# Patient Record
Sex: Female | Born: 1935
Health system: Southern US, Community
[De-identification: ages and names within clinical notes are randomized; demographics above are authoritative.]

## PROBLEM LIST (undated history)

## (undated) DIAGNOSIS — N289 Disorder of kidney and ureter, unspecified: Secondary | ICD-10-CM

## (undated) DIAGNOSIS — I10 Essential (primary) hypertension: Secondary | ICD-10-CM

## (undated) DIAGNOSIS — M109 Gout, unspecified: Secondary | ICD-10-CM

## (undated) DIAGNOSIS — R0989 Other specified symptoms and signs involving the circulatory and respiratory systems: Secondary | ICD-10-CM

## (undated) DIAGNOSIS — E119 Type 2 diabetes mellitus without complications: Secondary | ICD-10-CM

## (undated) HISTORY — DX: Gout, unspecified: M10.9

## (undated) HISTORY — DX: Type 2 diabetes mellitus without complications: E11.9

## (undated) HISTORY — DX: Other specified symptoms and signs involving the circulatory and respiratory systems: R09.89

## (undated) HISTORY — DX: Essential (primary) hypertension: I10

## (undated) HISTORY — DX: Disorder of kidney and ureter, unspecified: N28.9

## (undated) HISTORY — PX: CATARACT EXTRACTION: SUR2

## (undated) HISTORY — PX: APPENDECTOMY: SHX54

---

## 2011-01-03 ENCOUNTER — Encounter (HOSPITAL_BASED_OUTPATIENT_CLINIC_OR_DEPARTMENT_OTHER): Payer: Medicare Other | Attending: General Surgery

## 2011-01-03 DIAGNOSIS — E119 Type 2 diabetes mellitus without complications: Secondary | ICD-10-CM | POA: Insufficient documentation

## 2011-01-03 DIAGNOSIS — Z79899 Other long term (current) drug therapy: Secondary | ICD-10-CM | POA: Insufficient documentation

## 2011-01-03 DIAGNOSIS — L97809 Non-pressure chronic ulcer of other part of unspecified lower leg with unspecified severity: Secondary | ICD-10-CM | POA: Insufficient documentation

## 2011-01-17 ENCOUNTER — Ambulatory Visit (INDEPENDENT_AMBULATORY_CARE_PROVIDER_SITE_OTHER): Payer: Medicare Other | Admitting: Vascular Surgery

## 2011-01-17 DIAGNOSIS — L97909 Non-pressure chronic ulcer of unspecified part of unspecified lower leg with unspecified severity: Secondary | ICD-10-CM

## 2011-01-21 ENCOUNTER — Encounter (HOSPITAL_BASED_OUTPATIENT_CLINIC_OR_DEPARTMENT_OTHER): Payer: Medicare Other | Attending: General Surgery

## 2011-01-21 DIAGNOSIS — L97809 Non-pressure chronic ulcer of other part of unspecified lower leg with unspecified severity: Secondary | ICD-10-CM | POA: Insufficient documentation

## 2011-01-21 DIAGNOSIS — E119 Type 2 diabetes mellitus without complications: Secondary | ICD-10-CM | POA: Insufficient documentation

## 2011-01-21 DIAGNOSIS — Z79899 Other long term (current) drug therapy: Secondary | ICD-10-CM | POA: Insufficient documentation

## 2011-02-04 NOTE — H&P (Signed)
  Jill Allen, Jill Allen               ACCOUNT NO.:  [id number]  MEDICAL RECORD NO.:  [id number]  LOCATION:  FOOT                         FACILITY:  [facility]  PHYSICIAN:  Joanne Gavel, M.D.        DATE OF BIRTH:  18-May-1915  DATE OF ADMISSION:  01/03/2011 DATE OF DISCHARGE:                             HISTORY & PHYSICAL   CHIEF COMPLAINT:  Wound to left leg.  HISTORY OF PRESENT ILLNESS:  This is a 77 year old female with a history of stasis ulceration of the legs for several years.  These were very extensive but were treated with Unna boots and healed several years ago. She has developed 2 small wounds on the left leg.  There has been no trauma.  No systemic symptoms of fever.  She has recently come to [place] from [place].  PAST MEDICAL HISTORY:  She has type 2 diabetes that was treated with diet only.  PAST SURGICAL HISTORY:  She had an appendectomy in the remote past.  SOCIAL HISTORY:  Cigarettes none.  Alcohol none.  MEDICATIONS:  Acetaminophen, amlodipine, clonidine, colchicine, metoprolol, and pravastatin.  ALLERGIES:  None.  REVIEW OF SYSTEMS:  As above.  PHYSICAL EXAMINATION:  GENERAL:  Awake, alert, in no distress. CRANIAL:  Normocephalic. VITAL SIGNS:  Temperature 98.1, pulse 64 and regular, respirations 16, blood pressure 103/98. EYES, EARS, NOSE, THROAT:  Normal. NECK:  Supple. CHEST:  Clear. HEART:  Regular rhythm. ABDOMEN:  Not examined. EXTREMITIES:  Reveals many signs of stasis dermatitis and very dry skin. There were two small ulcers 2.0 x 3.1 posteriorly and 0.8 x 1.0 anteriorly.  Peripheral pulses are not palpable.  IMPRESSION:  Probably stasis ulcers.  PLAN:  Vascular studies dressed now with Santyl and hydrogel and probable use of Unna boot and vascular studies are okay.     Joanne Gavel, M.D.     RA/MEDQ  D:  01/03/2011  T:  01/03/2011  Job:  [id number]  Electronically Signed by Joanne Gavel M.D. on 02/04/2011 09:14:51 AM

## 2011-02-18 ENCOUNTER — Encounter (HOSPITAL_BASED_OUTPATIENT_CLINIC_OR_DEPARTMENT_OTHER): Payer: Medicare Other | Attending: General Surgery

## 2011-02-18 DIAGNOSIS — L97809 Non-pressure chronic ulcer of other part of unspecified lower leg with unspecified severity: Secondary | ICD-10-CM | POA: Insufficient documentation

## 2011-02-18 DIAGNOSIS — E119 Type 2 diabetes mellitus without complications: Secondary | ICD-10-CM | POA: Insufficient documentation

## 2011-02-18 DIAGNOSIS — Z79899 Other long term (current) drug therapy: Secondary | ICD-10-CM | POA: Insufficient documentation

## 2011-03-18 ENCOUNTER — Encounter (HOSPITAL_BASED_OUTPATIENT_CLINIC_OR_DEPARTMENT_OTHER): Payer: Medicare Other | Attending: General Surgery

## 2011-03-18 DIAGNOSIS — Z79899 Other long term (current) drug therapy: Secondary | ICD-10-CM | POA: Insufficient documentation

## 2011-03-18 DIAGNOSIS — L97809 Non-pressure chronic ulcer of other part of unspecified lower leg with unspecified severity: Secondary | ICD-10-CM | POA: Insufficient documentation

## 2011-03-18 DIAGNOSIS — E119 Type 2 diabetes mellitus without complications: Secondary | ICD-10-CM | POA: Insufficient documentation

## 2011-04-22 ENCOUNTER — Encounter (HOSPITAL_BASED_OUTPATIENT_CLINIC_OR_DEPARTMENT_OTHER): Payer: Medicare Other | Attending: General Surgery

## 2011-04-22 DIAGNOSIS — L97809 Non-pressure chronic ulcer of other part of unspecified lower leg with unspecified severity: Secondary | ICD-10-CM | POA: Insufficient documentation

## 2011-05-20 ENCOUNTER — Encounter (HOSPITAL_BASED_OUTPATIENT_CLINIC_OR_DEPARTMENT_OTHER): Payer: Medicare Other | Attending: General Surgery

## 2011-05-20 DIAGNOSIS — L97909 Non-pressure chronic ulcer of unspecified part of unspecified lower leg with unspecified severity: Secondary | ICD-10-CM | POA: Insufficient documentation

## 2011-05-20 DIAGNOSIS — I83009 Varicose veins of unspecified lower extremity with ulcer of unspecified site: Secondary | ICD-10-CM | POA: Insufficient documentation

## 2011-05-27 ENCOUNTER — Encounter (HOSPITAL_BASED_OUTPATIENT_CLINIC_OR_DEPARTMENT_OTHER): Payer: Medicare Other

## 2011-09-03 ENCOUNTER — Encounter (HOSPITAL_BASED_OUTPATIENT_CLINIC_OR_DEPARTMENT_OTHER): Payer: Medicare Other | Attending: General Surgery

## 2011-09-03 DIAGNOSIS — L97809 Non-pressure chronic ulcer of other part of unspecified lower leg with unspecified severity: Secondary | ICD-10-CM | POA: Insufficient documentation

## 2011-09-16 ENCOUNTER — Encounter (HOSPITAL_BASED_OUTPATIENT_CLINIC_OR_DEPARTMENT_OTHER): Payer: Medicare Other | Attending: General Surgery

## 2011-09-16 DIAGNOSIS — E119 Type 2 diabetes mellitus without complications: Secondary | ICD-10-CM | POA: Insufficient documentation

## 2011-09-16 DIAGNOSIS — I872 Venous insufficiency (chronic) (peripheral): Secondary | ICD-10-CM | POA: Insufficient documentation

## 2011-09-16 DIAGNOSIS — L97809 Non-pressure chronic ulcer of other part of unspecified lower leg with unspecified severity: Secondary | ICD-10-CM | POA: Insufficient documentation

## 2011-09-16 DIAGNOSIS — Z79899 Other long term (current) drug therapy: Secondary | ICD-10-CM | POA: Insufficient documentation

## 2011-10-08 ENCOUNTER — Encounter (HOSPITAL_BASED_OUTPATIENT_CLINIC_OR_DEPARTMENT_OTHER): Payer: Medicare Other

## 2012-09-10 ENCOUNTER — Encounter (HOSPITAL_BASED_OUTPATIENT_CLINIC_OR_DEPARTMENT_OTHER): Payer: Medicare Other

## 2013-01-19 ENCOUNTER — Encounter: Payer: Self-pay | Admitting: Podiatrist

## 2013-01-19 ENCOUNTER — Ambulatory Visit (INDEPENDENT_AMBULATORY_CARE_PROVIDER_SITE_OTHER): Payer: Medicare Other | Admitting: Podiatrist

## 2013-01-19 VITALS — BP 131/70 | HR 59 | Resp 20 | Ht 65.0 in | Wt 165.0 lb

## 2013-01-19 DIAGNOSIS — B351 Tinea unguium: Secondary | ICD-10-CM

## 2013-01-19 DIAGNOSIS — M79609 Pain in unspecified limb: Secondary | ICD-10-CM

## 2013-01-19 NOTE — Progress Notes (Signed)
Chief Complaint  Patient presents with  . Follow-up    THE TOENAILS CUT     HPI: Patient is 79 y.o. female who presents today for foot and nail care.  States pain in toenails with ambulation and shoegear     Physical Exam  GENERAL APPEARANCE: Alert, conversant. Appropriately groomed. No acute distress.  VASCULAR: Pedal pulses palpable and strong bilateral.  Capillary refill time is immediate to all digits,  Proximal to distal cooling it warm to warm.  Digital hair growth is present bilateral  NEUROLOGIC: sensation is intact epicritically and protectively to 5.07 monofilament at 5/5 sites bilateral.  Light touch is intact bilateral, vibratory sensation intact bilateral, achilles tendon reflex is intact bilateral.  MUSCULOSKELETAL: acceptable muscle strength, tone and stability bilateral.  Intrinsic muscluature intact bilateral.  Rectus appearance of foot and digits noted bilateral.   DERMATOLOGIC: skin color, texture, and turger are within normal limits.  No preulcerative lesions are seen, no interdigital maceration noted.  No open lesions present.  Digital nails are symptomatic and painful. Patient's nails are elongated thick and discolored dystrophic friable brittle and clinically mycotic x 10  Assessment: painful mycotic toenails  Plan:Discussed etiology, pathology, conservative vs. Surgical therapies and at this time debridement of symptomatic toenails was recommended.  Onychoreduction of symptomatic toenails was performed without iatrogenic incident.  Patient was instructed on signs and symptoms of infection and was told to call immediately should any of these arise.    Marlowe Aschoff, DPM

## 2013-01-19 NOTE — Patient Instructions (Signed)
Call if any questions or concerns

## 2013-05-25 ENCOUNTER — Encounter: Payer: Self-pay | Admitting: Podiatrist

## 2013-05-25 ENCOUNTER — Ambulatory Visit (INDEPENDENT_AMBULATORY_CARE_PROVIDER_SITE_OTHER): Payer: Medicare Other | Admitting: Podiatrist

## 2013-05-25 VITALS — BP 133/63 | HR 59 | Resp 16

## 2013-05-25 DIAGNOSIS — M79609 Pain in unspecified limb: Secondary | ICD-10-CM

## 2013-05-25 DIAGNOSIS — B351 Tinea unguium: Secondary | ICD-10-CM

## 2013-05-25 NOTE — Patient Instructions (Signed)
Diabetes and Foot Care Diabetes may cause you to have problems because of poor blood supply (circulation) to your feet and legs. This may cause the skin on your feet to become thinner, break easier, and heal more slowly. Your skin may become dry, and the skin may peel and crack. You may also have nerve damage in your legs and feet causing decreased feeling in them. You may not notice minor injuries to your feet that could lead to infections or more serious problems. Taking care of your feet is one of the most important things you can do for yourself.  HOME CARE INSTRUCTIONS  Wear shoes at all times, even in the house. Do not go barefoot. Bare feet are easily injured.  Check your feet daily for blisters, cuts, and redness. If you cannot see the bottom of your feet, use a mirror or ask someone for help.  Wash your feet with warm water (do not use hot water) and mild soap. Then pat your feet and the areas between your toes until they are completely dry. Do not soak your feet as this can dry your skin.  Apply a moisturizing lotion or petroleum jelly (that does not contain alcohol and is unscented) to the skin on your feet and to dry, brittle toenails. Do not apply lotion between your toes.  Trim your toenails straight across. Do not dig under them or around the cuticle. File the edges of your nails with an emery board or nail file.  Do not cut corns or calluses or try to remove them with medicine.  Wear clean socks or stockings every day. Make sure they are not too tight. Do not wear knee-high stockings since they may decrease blood flow to your legs.  Wear shoes that fit properly and have enough cushioning. To break in new shoes, wear them for just a few hours a day. This prevents you from injuring your feet. Always look in your shoes before you put them on to be sure there are no objects inside.  Do not cross your legs. This may decrease the blood flow to your feet.  If you find a minor scrape,  cut, or break in the skin on your feet, keep it and the skin around it clean and dry. These areas may be cleansed with mild soap and water. Do not cleanse the area with peroxide, alcohol, or iodine.  When you remove an adhesive bandage, be sure not to damage the skin around it.  If you have a wound, look at it several times a day to make sure it is healing.  Do not use heating pads or hot water bottles. They may burn your skin. If you have lost feeling in your feet or legs, you may not know it is happening until it is too late.  Make sure your health care provider performs a complete foot exam at least annually or more often if you have foot problems. Report any cuts, sores, or bruises to your health care provider immediately. SEEK MEDICAL CARE IF:   You have an injury that is not healing.  You have cuts or breaks in the skin.  You have an ingrown nail.  You notice redness on your legs or feet.  You feel burning or tingling in your legs or feet.  You have pain or cramps in your legs and feet.  Your legs or feet are numb.  Your feet always feel cold. SEEK IMMEDIATE MEDICAL CARE IF:   There is increasing redness,   swelling, or pain in or around a wound.  There is a red line that goes up your leg.  Pus is coming from a wound.  You develop a fever or as directed by your health care provider.  You notice a bad smell coming from an ulcer or wound. Document Released: 03/28/2000 Document Revised: 12/01/2012 Document Reviewed: 09/07/2012 Exit[employer] Patient Information 2014 [employer].  

## 2013-05-26 NOTE — Progress Notes (Signed)
HPI:  Patient presents today for follow up of foot and nail care. Denies any new complaints today.  Objective:  Patients chart is reviewed.  Vascular status reveals pedal pulses noted at 1 out of 4 dp and pt bilateral .  Neurological sensation is Normal to [employer] monofilament bilateral.  Patients nails are thickened, discolored, distrophic, friable and brittle with yellow-brown discoloration. Patient subjectively relates they are painful with shoes and with ambulation of bilateral feet.  Assessment:  Symptomatic onychomycosis  Plan:  Discussed treatment options and alternatives.  The symptomatic toenails were debrided through manual an mechanical means without complication.  Return appointment recommended at routine intervals of 3 months    Ustin Cruickshank, DPM   

## 2013-07-05 ENCOUNTER — Encounter (HOSPITAL_BASED_OUTPATIENT_CLINIC_OR_DEPARTMENT_OTHER): Payer: Medicare Other | Attending: General Surgery

## 2013-07-05 DIAGNOSIS — M109 Gout, unspecified: Secondary | ICD-10-CM | POA: Insufficient documentation

## 2013-07-05 DIAGNOSIS — K219 Gastro-esophageal reflux disease without esophagitis: Secondary | ICD-10-CM | POA: Insufficient documentation

## 2013-07-05 DIAGNOSIS — L97909 Non-pressure chronic ulcer of unspecified part of unspecified lower leg with unspecified severity: Principal | ICD-10-CM

## 2013-07-05 DIAGNOSIS — E119 Type 2 diabetes mellitus without complications: Secondary | ICD-10-CM | POA: Insufficient documentation

## 2013-07-05 DIAGNOSIS — I87339 Chronic venous hypertension (idiopathic) with ulcer and inflammation of unspecified lower extremity: Secondary | ICD-10-CM | POA: Insufficient documentation

## 2013-07-06 NOTE — H&P (Signed)
NAMEMarland Allen  ARDELL, AARONSON NO.:  [id number]  MEDICAL RECORD NO.:  [id number]  LOCATION:  FOOT                         FACILITY:  [facility]  PHYSICIAN:  Elesa Hacker, M.D.        DATE OF BIRTH:  1915/12/04  DATE OF ADMISSION:  07/05/2013 DATE OF DISCHARGE:                             HISTORY & PHYSICAL   CHIEF COMPLAINT:  Wound, left leg.  HISTORY OF PRESENT ILLNESS:  This is a 80 year old female with a history of chronic venous hypertension with a previous ulcers.  Several weeks ago, she developed blisters on both legs.  These broke down to small abrasions.  The family treated them with A and D ointment, and all of them a healed except for one on the left foreleg.  PAST MEDICAL HISTORY:  Significant for type 2 diabetes, hypertension, gout, and acid reflux.  PAST SURGICAL HISTORY:  Cataracts and appendectomy as a child.  SOCIAL HISTORY:  Cigarettes, none for many years.  Alcohol, none.  MEDICATIONS:  Amlodipine, colchicine, Prinivil, metformin, and Toprol.  ALLERGIES:  None.  REVIEW OF SYSTEMS:  Essentially negative.  The patient is mildly demented, but very pleasant.  PHYSICAL EXAMINATION:  VITAL SIGNS:  Temperature 97.7, pulse 50 and regular, respirations 18, blood pressure 123/70.  Glucose is 87. CHEST:  Clear. HEART:  Regular rhythm.  ABI is 0.89. EXTREMITIES:  Dorsalis pedis pulses palpable.  On the anterior foreleg there is a 0.2 x 0.2 x 0.1 wound which appears to be a slight small abrasion.  There are many scars on both legs from previous episodes of ulceration. There is minimal edema.  IMPRESSION:  Chronic venous hypertension with ulcer and inflammation.  PLAN OF TREATMENT:  We will start with silver collagen and light wrap. We will see her in 7 days.     Elesa Hacker, M.D.     RA/MEDQ  D:  07/05/2013  T:  07/05/2013  Job:  [id number]

## 2013-07-07 ENCOUNTER — Encounter (HOSPITAL_BASED_OUTPATIENT_CLINIC_OR_DEPARTMENT_OTHER): Payer: Medicare Other

## 2013-09-13 ENCOUNTER — Encounter (HOSPITAL_BASED_OUTPATIENT_CLINIC_OR_DEPARTMENT_OTHER): Payer: Medicare Other | Attending: General Surgery

## 2013-09-13 DIAGNOSIS — I87339 Chronic venous hypertension (idiopathic) with ulcer and inflammation of unspecified lower extremity: Secondary | ICD-10-CM | POA: Insufficient documentation

## 2013-09-13 DIAGNOSIS — L97909 Non-pressure chronic ulcer of unspecified part of unspecified lower leg with unspecified severity: Principal | ICD-10-CM

## 2013-09-13 DIAGNOSIS — L97809 Non-pressure chronic ulcer of other part of unspecified lower leg with unspecified severity: Secondary | ICD-10-CM | POA: Insufficient documentation

## 2013-09-14 NOTE — H&P (Signed)
Jill Allen, Jill Allen               ACCOUNT NO.:  [id number]  MEDICAL RECORD NO.:  [id number]  LOCATION:  FOOT                         FACILITY:  [facility]  PHYSICIAN:  Elesa Hacker, M.D.        DATE OF BIRTH:  1915-06-13  DATE OF ADMISSION:  09/13/2013 DATE OF DISCHARGE:                             HISTORY & PHYSICAL   CHIEF COMPLAINT:  Wounds left leg.  HISTORY OF PRESENT ILLNESS:  This patient with a chronic venous hypertension seen here previously developed blisters on the left leg approximately 1 month ago.  She was seen in an outpatient clinic and given ointment to place on the wounds.  PAST MEDICAL HISTORY:  Significant for type 2 diabetes, gout, acid reflux, and hypertension.  PAST SURGICAL HISTORY:  Appendectomy and cataracts.  SOCIAL HISTORY:  Cigarettes none.  Alcohol none.  MEDICATIONS:  Amlodipine, colchicine, Prinivil, metformin, Tylenol.  ALLERGIES:  None.  REVIEW OF SYSTEMS:  As above.  PHYSICAL EXAMINATION:  VITAL SIGNS:  Temperature 97.7, pulse 51, respirations 18, blood pressure 123/70.  Glucose is 87. GENERAL APPEARANCE:  Well developed, well nourished, awake and alert. CHEST:  Clear. HEART:  Regular rhythm. EXTREMITIES:  Examination of the lower extremities reveals a tiny blister on the right medial lower extremity and was 0.5 x 0.5.  On the left lower extremity, there are 2 wounds.  The proximal wound is 1.0 x 1.5, the distal wound is 5.0 x 4.8, both are very superficial.  I believe I can palpate the dorsalis pedis pulse on the left leg and possibly in the right.  ABI measured in left leg was 0.89.  PLAN OF TREATMENT:  We will start with silver alginate, Kerlix, Coban. If wounds do not heal rapidly, we will get vascular studies.     Elesa Hacker, M.D.     RA/MEDQ  D:  09/13/2013  T:  09/14/2013  Job:  [id number]

## 2013-09-15 ENCOUNTER — Ambulatory Visit: Payer: Medicare Other | Admitting: Podiatrist

## 2013-09-22 ENCOUNTER — Ambulatory Visit: Payer: Medicare Other | Admitting: Podiatrist

## 2013-10-19 ENCOUNTER — Ambulatory Visit (INDEPENDENT_AMBULATORY_CARE_PROVIDER_SITE_OTHER): Payer: Medicare Other | Admitting: Podiatrist

## 2013-10-19 ENCOUNTER — Encounter: Payer: Self-pay | Admitting: Podiatrist

## 2013-10-19 DIAGNOSIS — B351 Tinea unguium: Secondary | ICD-10-CM

## 2013-10-19 DIAGNOSIS — M79609 Pain in unspecified limb: Secondary | ICD-10-CM

## 2013-10-19 DIAGNOSIS — M79673 Pain in unspecified foot: Secondary | ICD-10-CM

## 2013-10-19 NOTE — Progress Notes (Signed)
I AM HERE TO GET MY TOENAILS TRIMMED  HPI: Patient presents today for follow up of foot and nail care. Denies any new complaints today.  Objective: Patients chart is reviewed. Vascular status reveals pedal pulses noted at 1 out of 4 dp and pt bilateral . Neurological sensation is Normal to [employer] monofilament bilateral. Patients nails are thickened, discolored, distrophic, friable and brittle with yellow-brown discoloration. Patient subjectively relates they are painful with shoes and with ambulation of bilateral feet.  Assessment: Symptomatic onychomycosis  Plan: Discussed treatment options and alternatives. The symptomatic toenails were debrided through manual an mechanical means without complication. Return appointment recommended at routine intervals of 3 months  Trudie Buckler, DPM

## 2014-01-19 ENCOUNTER — Ambulatory Visit: Payer: Medicare Other | Admitting: Podiatrist

## 2014-02-16 ENCOUNTER — Ambulatory Visit (INDEPENDENT_AMBULATORY_CARE_PROVIDER_SITE_OTHER): Payer: Medicare Other | Admitting: Podiatrist

## 2014-02-16 ENCOUNTER — Encounter: Payer: Self-pay | Admitting: Podiatrist

## 2014-02-16 DIAGNOSIS — B351 Tinea unguium: Secondary | ICD-10-CM

## 2014-02-16 DIAGNOSIS — M79676 Pain in unspecified toe(s): Secondary | ICD-10-CM

## 2014-02-21 NOTE — Progress Notes (Signed)
  I AM HERE TO GET MY TOENAILS TRIMMED  HPI: Patient presents today for follow up of foot and nail care. Denies any new complaints today.  Objective: Patients chart is reviewed. Vascular status reveals pedal pulses noted at 1 out of 4 dp and pt bilateral . Neurological sensation is Normal to [employer] monofilament bilateral. Patients nails are thickened, discolored, distrophic, friable and brittle with yellow-brown discoloration. Patient subjectively relates they are painful with shoes and with ambulation of bilateral feet.  Assessment: Symptomatic onychomycosis  Plan: Discussed treatment options and alternatives. The symptomatic toenails were debrided through manual an mechanical means without complication. Return appointment recommended at routine intervals of 3 months

## 2014-06-29 ENCOUNTER — Ambulatory Visit: Payer: Medicare Other | Admitting: Podiatrist

## 2014-07-12 ENCOUNTER — Encounter (HOSPITAL_COMMUNITY): Payer: Self-pay

## 2014-07-12 ENCOUNTER — Emergency Department (HOSPITAL_COMMUNITY)
Admission: EM | Admit: 2014-07-12 | Discharge: 2014-07-12 | Disposition: A | Discharge status: Home / Self Care | Payer: Medicare Other | Attending: Emergency Medicine | Admitting: Emergency Medicine

## 2014-07-12 DIAGNOSIS — M79605 Pain in left leg: Secondary | ICD-10-CM | POA: Diagnosis present

## 2014-07-12 DIAGNOSIS — E119 Type 2 diabetes mellitus without complications: Secondary | ICD-10-CM | POA: Insufficient documentation

## 2014-07-12 DIAGNOSIS — Z79899 Other long term (current) drug therapy: Secondary | ICD-10-CM | POA: Insufficient documentation

## 2014-07-12 DIAGNOSIS — Z87891 Personal history of nicotine dependence: Secondary | ICD-10-CM | POA: Insufficient documentation

## 2014-07-12 DIAGNOSIS — M109 Gout, unspecified: Secondary | ICD-10-CM | POA: Diagnosis not present

## 2014-07-12 DIAGNOSIS — I10 Essential (primary) hypertension: Secondary | ICD-10-CM | POA: Insufficient documentation

## 2014-07-12 DIAGNOSIS — I872 Venous insufficiency (chronic) (peripheral): Secondary | ICD-10-CM | POA: Diagnosis not present

## 2014-07-12 DIAGNOSIS — Z87448 Personal history of other diseases of urinary system: Secondary | ICD-10-CM | POA: Diagnosis not present

## 2014-07-12 NOTE — ED Provider Notes (Signed)
CSN: [id number]     Arrival date & time 07/12/14  1136 History   First MD Initiated Contact with Patient 07/12/14 1137     Chief Complaint  Patient presents with  . Leg Pain     (Consider location/radiation/quality/duration/timing/severity/associated sxs/prior Treatment) HPI Comments: Patient with history of venous stasis -- presents with bilateral lower extremity pain and blisters similar to previous blisters that she has had with her venous stasis. Patient wears compression stockings and elevates her legs. Patient has noted yellow discharge from the blisters. No fever, redness or warmth. No nausea or vomiting. Patient has been seen by wound care for this in the past. She states that she is unable to get an appointment for 2 weeks. She is not having any difficulty ambulating with respect to her baseline. No other treatments prior to arrival. The onset of this condition was acute. The course is constant. Aggravating factors: none. Alleviating factors: none.    The history is provided by the patient and medical records.    Past Medical History  Diagnosis Date  . Gout   . Diabetes     SLOW TO HEAL  . Poor circulation   . HBP (high blood pressure)   . Kidney disease    Past Surgical History  Procedure Laterality Date  . Appendectomy    . Cataract extraction Bilateral    No family history on file. History  Substance Use Topics  . Smoking status: Former Smoker    Types: Cigarettes    Quit date: 01/19/1973  . Smokeless tobacco: Never Used  . Alcohol Use: No   OB History    No data available     Review of Systems  Constitutional: Negative for fever.  HENT: Negative for rhinorrhea and sore throat.   Eyes: Negative for redness.  Respiratory: Negative for cough.   Cardiovascular: Positive for leg swelling. Negative for chest pain.  Gastrointestinal: Negative for nausea, vomiting, abdominal pain and diarrhea.  Genitourinary: Negative for dysuria.  Musculoskeletal: Positive for  myalgias.  Skin: Negative for color change and rash.  Neurological: Negative for headaches.    Allergies  Review of patient's allergies indicates no known allergies.  Home Medications   Prior to Admission medications   Medication Sig Start Date End Date Taking? Authorizing Provider  AMLODIPINE BESYLATE PO TAKE 10 MG ONE A DAY    Historical Provider, MD  atorvastatin (LIPITOR) 40 MG tablet  09/24/13   Historical Provider, MD  colchicine (COLCRYS) 0.6 MG tablet Take 0.6 mg by mouth as needed.    Historical Provider, MD  lisinopril (PRINIVIL,ZESTRIL) 20 MG tablet Take 20 mg by mouth daily.    Historical Provider, MD  metoprolol succinate (TOPROL-XL) 100 MG 24 hr tablet Take 100 mg by mouth daily. Take with or immediately following a meal.    Historical Provider, MD  silver sulfADIAZINE (SILVADENE) 1 % cream  08/29/13   Historical Provider, MD  ULORIC 40 MG tablet  09/24/13   Historical Provider, MD   BP 126/85 mmHg  Pulse 76  Temp(Src) 98.5 F (36.9 C) (Oral)  Resp 18  SpO2 100%   Physical Exam  Constitutional: She appears well-developed and well-nourished.  HENT:  Head: Normocephalic and atraumatic.  Eyes: Conjunctivae are normal. Right eye exhibits no discharge. Left eye exhibits no discharge.  Neck: Normal range of motion. Neck supple.  Cardiovascular: Normal rate, regular rhythm and normal heart sounds.   Pulmonary/Chest: Effort normal and breath sounds normal.  Abdominal: Soft. There is no tenderness.  Musculoskeletal: She exhibits edema (2+ bilateral lower extremities to knees) and tenderness.  Neurological: She is alert.  Skin: Skin is warm and dry.  Patient with extensive venous stasis changes of bilateral LE, appears chronic, several scattered bullae most of which are broken. There is mild yellow serous drainage from these areas. Patient is generally tender. No asymmetric swelling. No associated cellulitis or lymphangitis.  Psychiatric: She has a normal mood and affect.   Nursing note and vitals reviewed.   ED Course  Procedures (including critical care time) Labs Review Labs Reviewed - No data to display  Imaging Review No results found.   EKG Interpretation None       12:44 PM Patient seen and examined. Patient discussed with and seen by Dr. Reather Converse.   Vital signs reviewed and are as follows: BP 126/85 mmHg  Pulse 76  Temp(Src) 98.5 F (36.9 C) (Oral)  Resp 18  SpO2 100%  1:06 PM Will provide wound care in the ED. Will have patient f/u with PCP and wound care and provide wound care in home.   2:55 PM Patient's legs have been bandaged. She is ready for discharge. Encouraged PCP/wound care follow-up. Counseled on s/s to return.   Pt urged to return with worsening pain, worsening swelling, expanding area of redness or streaking up extremity, fever, or any other concerns. Pt verbalizes understanding and agrees with plan.   MDM   Final diagnoses:  Venous stasis dermatitis of both lower extremities   Patient with chronic venous stasis changes of bilateral lower extremities. She does have wounds related to bullae which accumulate clear fluid. None of these appear overtly infected today. Patient urged to continue compression stockings, elevation of her extremities. She is to follow-up with her wound care doctor when she can get an appointment.    Carlisle Cater, PA-C 07/12/14 1458  Elnora Morrison, MD 07/12/14 [phone]

## 2014-07-12 NOTE — ED Notes (Signed)
Pt. Is from home. Complaint of R knee pain. EMS noted bilateral leg edema and weeping to the R knee. Pt. Hx of DM CBG 241.

## 2014-07-12 NOTE — Discharge Instructions (Signed)
Please read and follow all provided instructions.  Your diagnoses today include:  1. Venous stasis dermatitis of both lower extremities    Tests performed today include:  Vital signs. See below for your results today.   Medications prescribed:   None  Take any prescribed medications only as directed.   Home care instructions:  Follow any educational materials contained in this packet. Keep affected area above the level of your heart when possible. Wash area gently twice a day with warm soapy water. Do not apply alcohol or hydrogen peroxide. Cover the area if it draining or weeping.   Follow-up instructions: Please follow-up with your primary care provider in the next 1 week for further evaluation of your symptoms.   Return instructions:  Return to the Emergency Department if you have:  Fever  Worsening symptoms  Worsening pain  Worsening swelling  Redness of the skin that moves away from the affected area, especially if it streaks away from the affected area   Any other emergent concerns  Your vital signs today were: BP 135/66 mmHg   Pulse 68   Temp(Src) 98.5 F (36.9 C) (Oral)   Resp 18   SpO2 97% If your blood pressure (BP) was elevated above 135/85 this visit, please have this repeated by your doctor within one month. --------------

## 2014-07-27 ENCOUNTER — Encounter: Payer: Self-pay | Admitting: Podiatrist

## 2014-07-27 ENCOUNTER — Ambulatory Visit (INDEPENDENT_AMBULATORY_CARE_PROVIDER_SITE_OTHER): Payer: Medicare Other | Admitting: Podiatry

## 2014-07-27 DIAGNOSIS — M79673 Pain in unspecified foot: Secondary | ICD-10-CM | POA: Diagnosis not present

## 2014-07-27 DIAGNOSIS — M79676 Pain in unspecified toe(s): Secondary | ICD-10-CM

## 2014-07-27 DIAGNOSIS — B351 Tinea unguium: Secondary | ICD-10-CM | POA: Diagnosis not present

## 2014-07-28 NOTE — Progress Notes (Signed)
I AM HERE TO GET MY TOENAILS TRIMMED  HPI: Patient presents today for follow up of foot and nail care. Denies any new complaints today.  Objective: Patients chart is reviewed. Vascular status reveals pedal pulses noted at 1 out of 4 dp and pt bilateral . Neurological sensation is Normal to [employer] monofilament bilateral. Patients nails are thickened, discolored, distrophic, friable and brittle with yellow-brown discoloration. Patient subjectively relates they are painful with shoes and with ambulation of bilateral feet.  Assessment: Symptomatic onychomycosis  Plan: Discussed treatment options and alternatives. The symptomatic toenails were debrided through manual an mechanical means without complication. Return appointment recommended at routine intervals of 3 months

## 2014-08-01 ENCOUNTER — Encounter (HOSPITAL_BASED_OUTPATIENT_CLINIC_OR_DEPARTMENT_OTHER): Payer: Medicare Other | Attending: General Surgery

## 2014-08-01 DIAGNOSIS — L97821 Non-pressure chronic ulcer of other part of left lower leg limited to breakdown of skin: Secondary | ICD-10-CM | POA: Insufficient documentation

## 2014-08-01 DIAGNOSIS — I87333 Chronic venous hypertension (idiopathic) with ulcer and inflammation of bilateral lower extremity: Secondary | ICD-10-CM | POA: Diagnosis not present

## 2014-08-01 DIAGNOSIS — L97811 Non-pressure chronic ulcer of other part of right lower leg limited to breakdown of skin: Secondary | ICD-10-CM | POA: Diagnosis present

## 2014-08-07 ENCOUNTER — Other Ambulatory Visit (HOSPITAL_BASED_OUTPATIENT_CLINIC_OR_DEPARTMENT_OTHER): Payer: Self-pay | Admitting: General Surgery

## 2014-08-07 ENCOUNTER — Ambulatory Visit (HOSPITAL_COMMUNITY)
Admission: RE | Admit: 2014-08-07 | Discharge: 2014-08-07 | Disposition: A | Discharge status: Home / Self Care | Payer: Medicare Other | Source: Ambulatory Visit | Attending: Vascular Surgery | Admitting: Vascular Surgery

## 2014-08-07 ENCOUNTER — Ambulatory Visit (INDEPENDENT_AMBULATORY_CARE_PROVIDER_SITE_OTHER)
Admission: RE | Admit: 2014-08-07 | Discharge: 2014-08-07 | Disposition: A | Discharge status: Home / Self Care | Payer: Medicare Other | Source: Ambulatory Visit | Attending: Vascular Surgery | Admitting: Vascular Surgery

## 2014-08-07 DIAGNOSIS — I872 Venous insufficiency (chronic) (peripheral): Secondary | ICD-10-CM | POA: Diagnosis not present

## 2014-08-07 DIAGNOSIS — L97309 Non-pressure chronic ulcer of unspecified ankle with unspecified severity: Secondary | ICD-10-CM

## 2014-08-07 DIAGNOSIS — L97919 Non-pressure chronic ulcer of unspecified part of right lower leg with unspecified severity: Secondary | ICD-10-CM | POA: Diagnosis not present

## 2014-08-07 DIAGNOSIS — L97929 Non-pressure chronic ulcer of unspecified part of left lower leg with unspecified severity: Secondary | ICD-10-CM | POA: Insufficient documentation

## 2014-08-08 DIAGNOSIS — L97811 Non-pressure chronic ulcer of other part of right lower leg limited to breakdown of skin: Secondary | ICD-10-CM | POA: Diagnosis not present

## 2014-08-08 DIAGNOSIS — L97821 Non-pressure chronic ulcer of other part of left lower leg limited to breakdown of skin: Secondary | ICD-10-CM | POA: Diagnosis not present

## 2014-08-08 DIAGNOSIS — I87333 Chronic venous hypertension (idiopathic) with ulcer and inflammation of bilateral lower extremity: Secondary | ICD-10-CM | POA: Diagnosis not present

## 2014-08-15 ENCOUNTER — Encounter (HOSPITAL_BASED_OUTPATIENT_CLINIC_OR_DEPARTMENT_OTHER): Payer: Medicare Other | Attending: General Surgery

## 2014-08-15 DIAGNOSIS — E11622 Type 2 diabetes mellitus with other skin ulcer: Secondary | ICD-10-CM | POA: Diagnosis not present

## 2014-08-15 DIAGNOSIS — I87313 Chronic venous hypertension (idiopathic) with ulcer of bilateral lower extremity: Secondary | ICD-10-CM | POA: Diagnosis present

## 2014-08-15 DIAGNOSIS — L97911 Non-pressure chronic ulcer of unspecified part of right lower leg limited to breakdown of skin: Secondary | ICD-10-CM | POA: Insufficient documentation

## 2014-08-15 DIAGNOSIS — I739 Peripheral vascular disease, unspecified: Secondary | ICD-10-CM | POA: Diagnosis not present

## 2014-08-17 ENCOUNTER — Encounter: Payer: Self-pay | Admitting: Vascular Surgery

## 2014-08-18 ENCOUNTER — Ambulatory Visit (INDEPENDENT_AMBULATORY_CARE_PROVIDER_SITE_OTHER): Payer: Medicare Other | Admitting: Vascular Surgery

## 2014-08-18 ENCOUNTER — Encounter: Payer: Self-pay | Admitting: Vascular Surgery

## 2014-08-18 VITALS — BP 145/67 | HR 77 | Ht 65.0 in | Wt 199.0 lb

## 2014-08-18 DIAGNOSIS — I872 Venous insufficiency (chronic) (peripheral): Secondary | ICD-10-CM | POA: Diagnosis not present

## 2014-08-18 DIAGNOSIS — I83019 Varicose veins of right lower extremity with ulcer of unspecified site: Secondary | ICD-10-CM | POA: Insufficient documentation

## 2014-08-18 DIAGNOSIS — L97929 Non-pressure chronic ulcer of unspecified part of left lower leg with unspecified severity: Secondary | ICD-10-CM

## 2014-08-18 DIAGNOSIS — L97919 Non-pressure chronic ulcer of unspecified part of right lower leg with unspecified severity: Secondary | ICD-10-CM

## 2014-08-18 DIAGNOSIS — I83029 Varicose veins of left lower extremity with ulcer of unspecified site: Secondary | ICD-10-CM | POA: Insufficient documentation

## 2014-08-18 DIAGNOSIS — I87312 Chronic venous hypertension (idiopathic) with ulcer of left lower extremity: Secondary | ICD-10-CM | POA: Diagnosis not present

## 2014-08-18 NOTE — Progress Notes (Signed)
Referred by:  [name], MD [address] [place], [place] [address]  Reason for referral: B shin ulcers  History of Present Illness  Jill Allen is a 81 y.o. (Nov 06, 1915) female with known prior history of venous stasis ulcers who presents with chief complaint: bilateral shin ulcers.  Patient notes, onset of ulcers 3-4 weeks ago, associated with no obvious trigger.  The patient's symptoms include: weeping ulcers.  Pt previous had B venous stasis ulcers that took 6 months to heal..  The patient has had no history of DVT, no history of pregnancy, no history of varicose vein, known history of venous stasis ulcers, no history of  Lymphedema and known history of skin changes in lower legs.  There is no family history of venous disorders.  The patient has used compression stockings in the past.  This patient notes intermittent claudication with walking but does not walk much anymore due to fall risks.  She is wheelchair bound.  Her atherosclerotic risks include: DM, HTN, prior smoking  Past Medical History  Diagnosis Date  . Gout   . Diabetes     SLOW TO HEAL  . Poor circulation   . HBP (high blood pressure)   . Kidney disease     Past Surgical History  Procedure Laterality Date  . Appendectomy    . Cataract extraction Bilateral     History   Social History  . Marital Status: Unknown    Spouse Name: N/A  . Number of Children: N/A  . Years of Education: N/A   Occupational History  . Not on file.   Social History Main Topics  . Smoking status: Former Smoker    Types: Cigarettes    Quit date: 01/19/1973  . Smokeless tobacco: Never Used  . Alcohol Use: No  . Drug Use: No  . Sexual Activity: Not on file   Other Topics Concern  . Not on file   Social History Narrative   Family History:  Patient can remember the medical history of her mother or father  Current Outpatient Prescriptions on File Prior to Visit  Medication Sig Dispense Refill  . AMLODIPINE BESYLATE  PO TAKE 10 MG ONE A DAY    . atorvastatin (LIPITOR) 40 MG tablet     . colchicine (COLCRYS) 0.6 MG tablet Take 0.6 mg by mouth as needed.    Marland Kitchen lisinopril (PRINIVIL,ZESTRIL) 20 MG tablet Take 20 mg by mouth daily.    . metoprolol succinate (TOPROL-XL) 100 MG 24 hr tablet Take 100 mg by mouth daily. Take with or immediately following a meal.    . silver sulfADIAZINE (SILVADENE) 1 % cream     . ULORIC 40 MG tablet      No current facility-administered medications on file prior to visit.    No Known Allergies   REVIEW OF SYSTEMS:  (Positives checked otherwise negative)  CARDIOVASCULAR:  [x]  chest pain, []  chest pressure, []  palpitations, []  shortness of breath when laying flat, []  shortness of breath with exertion,  [x]  pain in feet when walking, [x]  pain in feet when laying flat, []  history of blood clot in veins (DVT), []  history of phlebitis, [x]  swelling in legs, [x]  varicose veins  PULMONARY:  []  productive cough, []  asthma, []  wheezing  NEUROLOGIC:  [x]  weakness in arms or legs, [x]  numbness in arms or legs, []  difficulty speaking or slurred speech, []  temporary loss of vision in one eye, []  dizziness  HEMATOLOGIC:  []  bleeding problems, []  problems with blood  clotting too easily  MUSCULOSKEL:  []  joint pain, []  joint swelling  GASTROINTEST:  []  vomiting blood, []  blood in stool     GENITOURINARY:  []  burning with urination, []  blood in urine  PSYCHIATRIC:  []  history of major depression  INTEGUMENTARY:  []  rashes, [x]  ulcers  CONSTITUTIONAL:  []  fever, []  chills   Physical Examination Filed Vitals:   08/18/14 1337  BP: 145/67  Pulse: 77  Height: 5\' 5"  (1.651 m)  Weight: 199 lb (90.266 kg)  SpO2: 100%   Body mass index is 33.12 kg/(m^2).  General: A&O x 3, WDWN  Head: [place]/AT  Ear/Nose/Throat: Hearing grossly intact, nares w/o erythema or drainage, oropharynx w/o Erythema/Exudate  Eyes: PERRL, EOMI  Neck: Supple, no nuchal rigidity, no palpable  LAD  Pulmonary: Sym exp, good air movt, CTAB, no rales, rhonchi, & wheezing  Cardiac: RRR, Nl S1, S2, no Murmurs, rubs or gallops  Vascular: Vessel Right Left  Radial Palpable Palpable  Brachial Palpable Palpable  Carotid Palpable, without bruit Palpable, without bruit  Aorta Not palpable N/A  Femoral Not Palpable due to positioning Not Palpable due to positioning  Popliteal Not palpable Not palpable  PT Not Palpable Not Palpable  DP Faintly Palpable Faintly Palpable   Gastrointestinal: soft, NTND, -G/R, - HSM, - masses, - CVAT B  Musculoskeletal: M/S 5/5 BUE, able to raise both legs, Extremities without ischemic changes except  R lateral ulcer with some exudate, ~4 cm; L med calf ulcer 1-2 cm clean base, extensive B calf LDS, evidence of prior heal VSU  Neurologic: CN 2-12 grossly intact , Pain and light touch intact in extremities , Motor exam as listed above  Psychiatric: Judgment intact, Mood & affect appropriate for pt's clinical situation  Dermatologic: See M/S exam for extremity exam, no rashes otherwise noted  Lymph : No Cervical, Axillary, or Inguinal lymphadenopathy   Non-Invasive Vascular Imaging  BLE Venous Insufficiency Duplex (Date: 08/07/14):   RLE: no DVT and SVT, + GSV reflux: only SFJ, + deep venous reflux  LLE: no DVT and SVT, + GSV reflux: SFJ, + deep venous reflux  ABI (Date: 08/07/14)  R: [place], DP: mono, PT: mono, TBI: 0.55  L: [place], DP: mono, PT: mono, TBI: 0.52    Medical Decision Making  [name] is a 81 y.o. female who presents with: BLE chronic venous insufficiency (C6), Venous stasis ulcers, Moderate to severe BLE PAD   Despite the patient's PAD, she has palpable DP and TBI suggestive that she should heal.  The patient is mostly wheelchair bound so I doubt her intermittent claudication will ever limit her.  I agree that compressive therapy is best in this patient.  I would reverse any intervention for failure of wound care after  another 3 months.  Will repeat ABI in 3 month and check on her wounds again.  Realistically, this 45 year patient is not a good candidate for any interventions with only endovascular options even possible.  Thank you for allowing [place] to participate in this patient's care.  Adele Barthel, MD [facility] Office: [phone] Pager: [phone]  08/18/2014, 2:11 PM

## 2014-08-21 ENCOUNTER — Other Ambulatory Visit: Payer: Self-pay | Admitting: *Deleted

## 2014-08-21 DIAGNOSIS — I739 Peripheral vascular disease, unspecified: Secondary | ICD-10-CM

## 2014-08-22 DIAGNOSIS — I739 Peripheral vascular disease, unspecified: Secondary | ICD-10-CM | POA: Diagnosis not present

## 2014-08-22 DIAGNOSIS — I87313 Chronic venous hypertension (idiopathic) with ulcer of bilateral lower extremity: Secondary | ICD-10-CM | POA: Diagnosis not present

## 2014-08-22 DIAGNOSIS — L97911 Non-pressure chronic ulcer of unspecified part of right lower leg limited to breakdown of skin: Secondary | ICD-10-CM | POA: Diagnosis not present

## 2014-08-22 DIAGNOSIS — E11622 Type 2 diabetes mellitus with other skin ulcer: Secondary | ICD-10-CM | POA: Diagnosis not present

## 2014-08-29 DIAGNOSIS — I87313 Chronic venous hypertension (idiopathic) with ulcer of bilateral lower extremity: Secondary | ICD-10-CM | POA: Diagnosis not present

## 2014-08-29 DIAGNOSIS — L97911 Non-pressure chronic ulcer of unspecified part of right lower leg limited to breakdown of skin: Secondary | ICD-10-CM | POA: Diagnosis not present

## 2014-08-29 DIAGNOSIS — E11622 Type 2 diabetes mellitus with other skin ulcer: Secondary | ICD-10-CM | POA: Diagnosis not present

## 2014-08-29 DIAGNOSIS — I739 Peripheral vascular disease, unspecified: Secondary | ICD-10-CM | POA: Diagnosis not present

## 2014-09-05 DIAGNOSIS — E11622 Type 2 diabetes mellitus with other skin ulcer: Secondary | ICD-10-CM | POA: Diagnosis not present

## 2014-09-05 DIAGNOSIS — I87313 Chronic venous hypertension (idiopathic) with ulcer of bilateral lower extremity: Secondary | ICD-10-CM | POA: Diagnosis not present

## 2014-09-05 DIAGNOSIS — L97911 Non-pressure chronic ulcer of unspecified part of right lower leg limited to breakdown of skin: Secondary | ICD-10-CM | POA: Diagnosis not present

## 2014-09-05 DIAGNOSIS — I739 Peripheral vascular disease, unspecified: Secondary | ICD-10-CM | POA: Diagnosis not present

## 2014-09-12 DIAGNOSIS — I739 Peripheral vascular disease, unspecified: Secondary | ICD-10-CM | POA: Diagnosis not present

## 2014-09-12 DIAGNOSIS — E11622 Type 2 diabetes mellitus with other skin ulcer: Secondary | ICD-10-CM | POA: Diagnosis not present

## 2014-09-12 DIAGNOSIS — L97911 Non-pressure chronic ulcer of unspecified part of right lower leg limited to breakdown of skin: Secondary | ICD-10-CM | POA: Diagnosis not present

## 2014-09-12 DIAGNOSIS — I87313 Chronic venous hypertension (idiopathic) with ulcer of bilateral lower extremity: Secondary | ICD-10-CM | POA: Diagnosis not present

## 2014-09-19 ENCOUNTER — Encounter (HOSPITAL_BASED_OUTPATIENT_CLINIC_OR_DEPARTMENT_OTHER): Payer: Medicare Other | Attending: General Surgery

## 2014-09-19 DIAGNOSIS — I872 Venous insufficiency (chronic) (peripheral): Secondary | ICD-10-CM | POA: Insufficient documentation

## 2014-09-19 DIAGNOSIS — I87303 Chronic venous hypertension (idiopathic) without complications of bilateral lower extremity: Secondary | ICD-10-CM | POA: Diagnosis present

## 2014-09-19 DIAGNOSIS — I87311 Chronic venous hypertension (idiopathic) with ulcer of right lower extremity: Secondary | ICD-10-CM | POA: Diagnosis not present

## 2014-09-19 DIAGNOSIS — L97811 Non-pressure chronic ulcer of other part of right lower leg limited to breakdown of skin: Secondary | ICD-10-CM | POA: Diagnosis not present

## 2014-09-26 DIAGNOSIS — L97811 Non-pressure chronic ulcer of other part of right lower leg limited to breakdown of skin: Secondary | ICD-10-CM | POA: Diagnosis not present

## 2014-09-26 DIAGNOSIS — I87311 Chronic venous hypertension (idiopathic) with ulcer of right lower extremity: Secondary | ICD-10-CM | POA: Diagnosis not present

## 2014-09-26 DIAGNOSIS — I872 Venous insufficiency (chronic) (peripheral): Secondary | ICD-10-CM | POA: Diagnosis not present

## 2014-10-03 DIAGNOSIS — I872 Venous insufficiency (chronic) (peripheral): Secondary | ICD-10-CM | POA: Diagnosis not present

## 2014-10-03 DIAGNOSIS — I87311 Chronic venous hypertension (idiopathic) with ulcer of right lower extremity: Secondary | ICD-10-CM | POA: Diagnosis not present

## 2014-10-03 DIAGNOSIS — L97811 Non-pressure chronic ulcer of other part of right lower leg limited to breakdown of skin: Secondary | ICD-10-CM | POA: Diagnosis not present

## 2014-10-10 DIAGNOSIS — I87311 Chronic venous hypertension (idiopathic) with ulcer of right lower extremity: Secondary | ICD-10-CM | POA: Diagnosis not present

## 2014-10-10 DIAGNOSIS — L97811 Non-pressure chronic ulcer of other part of right lower leg limited to breakdown of skin: Secondary | ICD-10-CM | POA: Diagnosis not present

## 2014-10-10 DIAGNOSIS — I872 Venous insufficiency (chronic) (peripheral): Secondary | ICD-10-CM | POA: Diagnosis not present

## 2014-10-10 LAB — GLUCOSE, CAPILLARY: GLUCOSE-CAPILLARY: 135 mg/dL — AB (ref 65–99)

## 2014-10-17 ENCOUNTER — Encounter (HOSPITAL_BASED_OUTPATIENT_CLINIC_OR_DEPARTMENT_OTHER): Payer: Medicare Other | Attending: General Surgery

## 2014-10-17 DIAGNOSIS — M1990 Unspecified osteoarthritis, unspecified site: Secondary | ICD-10-CM | POA: Diagnosis not present

## 2014-10-17 DIAGNOSIS — E119 Type 2 diabetes mellitus without complications: Secondary | ICD-10-CM | POA: Insufficient documentation

## 2014-10-17 DIAGNOSIS — M109 Gout, unspecified: Secondary | ICD-10-CM | POA: Diagnosis not present

## 2014-10-17 DIAGNOSIS — I87313 Chronic venous hypertension (idiopathic) with ulcer of bilateral lower extremity: Secondary | ICD-10-CM | POA: Diagnosis not present

## 2014-10-17 DIAGNOSIS — I10 Essential (primary) hypertension: Secondary | ICD-10-CM | POA: Diagnosis not present

## 2014-10-17 DIAGNOSIS — I739 Peripheral vascular disease, unspecified: Secondary | ICD-10-CM | POA: Diagnosis not present

## 2014-10-17 LAB — GLUCOSE, CAPILLARY: Glucose-Capillary: 187 mg/dL — ABNORMAL HIGH (ref 65–99)

## 2014-10-20 ENCOUNTER — Encounter (HOSPITAL_COMMUNITY): Payer: Medicare Other

## 2014-10-20 ENCOUNTER — Ambulatory Visit: Payer: Medicare Other | Admitting: Vascular Surgery

## 2014-10-24 DIAGNOSIS — M109 Gout, unspecified: Secondary | ICD-10-CM | POA: Diagnosis not present

## 2014-10-24 DIAGNOSIS — I10 Essential (primary) hypertension: Secondary | ICD-10-CM | POA: Diagnosis not present

## 2014-10-24 DIAGNOSIS — I739 Peripheral vascular disease, unspecified: Secondary | ICD-10-CM | POA: Diagnosis not present

## 2014-10-24 DIAGNOSIS — I87313 Chronic venous hypertension (idiopathic) with ulcer of bilateral lower extremity: Secondary | ICD-10-CM | POA: Diagnosis not present

## 2014-10-24 DIAGNOSIS — E119 Type 2 diabetes mellitus without complications: Secondary | ICD-10-CM | POA: Diagnosis not present

## 2014-10-24 DIAGNOSIS — M1990 Unspecified osteoarthritis, unspecified site: Secondary | ICD-10-CM | POA: Diagnosis not present

## 2014-10-26 ENCOUNTER — Ambulatory Visit: Payer: Medicare Other | Admitting: Podiatry

## 2014-10-26 DIAGNOSIS — E119 Type 2 diabetes mellitus without complications: Secondary | ICD-10-CM | POA: Diagnosis not present

## 2014-10-26 DIAGNOSIS — M1990 Unspecified osteoarthritis, unspecified site: Secondary | ICD-10-CM | POA: Diagnosis not present

## 2014-10-26 DIAGNOSIS — I739 Peripheral vascular disease, unspecified: Secondary | ICD-10-CM | POA: Diagnosis not present

## 2014-10-26 DIAGNOSIS — L97819 Non-pressure chronic ulcer of other part of right lower leg with unspecified severity: Secondary | ICD-10-CM | POA: Diagnosis not present

## 2014-10-26 DIAGNOSIS — I872 Venous insufficiency (chronic) (peripheral): Secondary | ICD-10-CM | POA: Diagnosis not present

## 2014-10-26 DIAGNOSIS — I10 Essential (primary) hypertension: Secondary | ICD-10-CM | POA: Diagnosis not present

## 2014-10-31 ENCOUNTER — Encounter: Payer: Self-pay | Admitting: Vascular Surgery

## 2014-10-31 DIAGNOSIS — I739 Peripheral vascular disease, unspecified: Secondary | ICD-10-CM | POA: Diagnosis not present

## 2014-10-31 DIAGNOSIS — E119 Type 2 diabetes mellitus without complications: Secondary | ICD-10-CM | POA: Diagnosis not present

## 2014-10-31 DIAGNOSIS — M1990 Unspecified osteoarthritis, unspecified site: Secondary | ICD-10-CM | POA: Diagnosis not present

## 2014-10-31 DIAGNOSIS — L97819 Non-pressure chronic ulcer of other part of right lower leg with unspecified severity: Secondary | ICD-10-CM | POA: Diagnosis not present

## 2014-10-31 DIAGNOSIS — I872 Venous insufficiency (chronic) (peripheral): Secondary | ICD-10-CM | POA: Diagnosis not present

## 2014-10-31 DIAGNOSIS — I10 Essential (primary) hypertension: Secondary | ICD-10-CM | POA: Diagnosis not present

## 2014-11-02 DIAGNOSIS — I872 Venous insufficiency (chronic) (peripheral): Secondary | ICD-10-CM | POA: Diagnosis not present

## 2014-11-02 DIAGNOSIS — L97819 Non-pressure chronic ulcer of other part of right lower leg with unspecified severity: Secondary | ICD-10-CM | POA: Diagnosis not present

## 2014-11-02 DIAGNOSIS — M1990 Unspecified osteoarthritis, unspecified site: Secondary | ICD-10-CM | POA: Diagnosis not present

## 2014-11-02 DIAGNOSIS — I739 Peripheral vascular disease, unspecified: Secondary | ICD-10-CM | POA: Diagnosis not present

## 2014-11-02 DIAGNOSIS — E119 Type 2 diabetes mellitus without complications: Secondary | ICD-10-CM | POA: Diagnosis not present

## 2014-11-02 DIAGNOSIS — I10 Essential (primary) hypertension: Secondary | ICD-10-CM | POA: Diagnosis not present

## 2014-11-03 ENCOUNTER — Ambulatory Visit (INDEPENDENT_AMBULATORY_CARE_PROVIDER_SITE_OTHER): Payer: Medicare Other | Admitting: Vascular Surgery

## 2014-11-03 ENCOUNTER — Ambulatory Visit (HOSPITAL_COMMUNITY)
Admission: RE | Admit: 2014-11-03 | Discharge: 2014-11-03 | Disposition: A | Discharge status: Home / Self Care | Payer: Medicare Other | Source: Ambulatory Visit | Attending: Vascular Surgery | Admitting: Vascular Surgery

## 2014-11-03 ENCOUNTER — Encounter: Payer: Self-pay | Admitting: Vascular Surgery

## 2014-11-03 VITALS — BP 138/86 | HR 82 | Temp 98.1°F | Resp 18 | Ht 65.0 in | Wt 199.0 lb

## 2014-11-03 DIAGNOSIS — I87312 Chronic venous hypertension (idiopathic) with ulcer of left lower extremity: Secondary | ICD-10-CM

## 2014-11-03 DIAGNOSIS — I83029 Varicose veins of left lower extremity with ulcer of unspecified site: Secondary | ICD-10-CM

## 2014-11-03 DIAGNOSIS — E119 Type 2 diabetes mellitus without complications: Secondary | ICD-10-CM | POA: Diagnosis not present

## 2014-11-03 DIAGNOSIS — I129 Hypertensive chronic kidney disease with stage 1 through stage 4 chronic kidney disease, or unspecified chronic kidney disease: Secondary | ICD-10-CM | POA: Diagnosis not present

## 2014-11-03 DIAGNOSIS — E785 Hyperlipidemia, unspecified: Secondary | ICD-10-CM | POA: Diagnosis not present

## 2014-11-03 DIAGNOSIS — I739 Peripheral vascular disease, unspecified: Secondary | ICD-10-CM | POA: Diagnosis not present

## 2014-11-03 DIAGNOSIS — I779 Disorder of arteries and arterioles, unspecified: Secondary | ICD-10-CM | POA: Insufficient documentation

## 2014-11-03 DIAGNOSIS — L97929 Non-pressure chronic ulcer of unspecified part of left lower leg with unspecified severity: Principal | ICD-10-CM

## 2014-11-03 DIAGNOSIS — I872 Venous insufficiency (chronic) (peripheral): Secondary | ICD-10-CM | POA: Diagnosis not present

## 2014-11-03 DIAGNOSIS — I10 Essential (primary) hypertension: Secondary | ICD-10-CM | POA: Diagnosis not present

## 2014-11-03 DIAGNOSIS — N183 Chronic kidney disease, stage 3 (moderate): Secondary | ICD-10-CM | POA: Diagnosis not present

## 2014-11-03 NOTE — Progress Notes (Signed)
    Established Venous Insufficiency   History of Present Illness  Jill Allen is a 81 y.o. (April 04, 1916) female who presents with chief complaint: wound follow-up.  The patient's symptoms have not progressed.  The patient's symptoms are: healing venous ulcers.  Patient notes her wounds have almost completely healed.  The patient is getting unna boot currently.  The patient's PMH, PSH, SH, and FamHx are unchanged from 08/18/14.  Current Outpatient Prescriptions  Medication Sig Dispense Refill  . amLODipine (NORVASC) 10 MG tablet     . AMLODIPINE BESYLATE PO TAKE 10 MG ONE A DAY    . atorvastatin (LIPITOR) 40 MG tablet     . colchicine (COLCRYS) 0.6 MG tablet Take 0.6 mg by mouth as needed.    Marland Kitchen lisinopril (PRINIVIL,ZESTRIL) 20 MG tablet Take 20 mg by mouth daily.    . metoprolol succinate (TOPROL-XL) 100 MG 24 hr tablet Take 100 mg by mouth daily. Take with or immediately following a meal.    . silver sulfADIAZINE (SILVADENE) 1 % cream     . ULORIC 40 MG tablet      No current facility-administered medications for this visit.    No Known Allergies  On ROS today: no rest pain, healing VSU   Physical Examination  Filed Vitals:   11/03/14 1141 11/03/14 1147  BP: 146/71 138/86  Pulse: 82   Temp: 98.1 F (36.7 C)   TempSrc: Oral   Resp: 18   Height: 5\' 5"  (1.651 m)   Weight: 199 lb (90.266 kg)   SpO2: 100%    Body mass index is 33.12 kg/(m^2).  General: A&O x 3, WDWN  Pulmonary: Sym exp, good air movt, CTAB, no rales, rhonchi, & wheezing  Cardiac: RRR, Nl S1, S2, no Murmurs, rubs or gallops  Vascular: Vessel Right Left  Radial Palpable Palpable  Brachial Palpable Palpable  Carotid Palpable, without bruit Palpable, without bruit  Aorta Not palpable N/A  Femoral Palpable Palpable  Popliteal Not palpable Not palpable  PT Not Palpable Faintly Palpable  DP Not Palpable Faintly Palpable   Gastrointestinal: soft, NTND, no G/R, no HSM, - masses, no CVAT  B  Musculoskeletal: M/S 5/5 throughout , Extremities without ischemic changes , B stasis dermatitis with severe LDS, edema 1+, healed ulcers except two patches < dime sized on right and one < dime sized on left  Neurologic: Pain and light touch intact in extremities , Motor exam as listed above  ABI (Date: 11/03/2014)  R: [place], DP: damp mono, PT: damp mono, TBI: 0.48  L: [place], DP: mono, PT: mono, TBI: 0.61    Medical Decision Making  Jill Allen is a 81 y.o. female who presents with: B leg chronic venous insufficiency (C6), Healing VSU, moderate to severe PAD RLE, moderate LLE   The patient has no interest in any further interventions so declines further surveillance.  Once she has healed completely, would continue with compression stockings.  Thank you for allowing [place] to participate in this patient's care.   Adele Barthel, MD [facility] Office: [phone] Pager: [phone]  11/03/2014, 1:41 PM

## 2014-11-07 DIAGNOSIS — I87313 Chronic venous hypertension (idiopathic) with ulcer of bilateral lower extremity: Secondary | ICD-10-CM | POA: Diagnosis not present

## 2014-11-07 DIAGNOSIS — E119 Type 2 diabetes mellitus without complications: Secondary | ICD-10-CM | POA: Diagnosis not present

## 2014-11-07 DIAGNOSIS — I739 Peripheral vascular disease, unspecified: Secondary | ICD-10-CM | POA: Diagnosis not present

## 2014-11-07 DIAGNOSIS — I10 Essential (primary) hypertension: Secondary | ICD-10-CM | POA: Diagnosis not present

## 2014-11-07 DIAGNOSIS — M109 Gout, unspecified: Secondary | ICD-10-CM | POA: Diagnosis not present

## 2014-11-07 DIAGNOSIS — M1990 Unspecified osteoarthritis, unspecified site: Secondary | ICD-10-CM | POA: Diagnosis not present

## 2014-11-08 DIAGNOSIS — M1990 Unspecified osteoarthritis, unspecified site: Secondary | ICD-10-CM | POA: Diagnosis not present

## 2014-11-08 DIAGNOSIS — L97819 Non-pressure chronic ulcer of other part of right lower leg with unspecified severity: Secondary | ICD-10-CM | POA: Diagnosis not present

## 2014-11-08 DIAGNOSIS — I872 Venous insufficiency (chronic) (peripheral): Secondary | ICD-10-CM | POA: Diagnosis not present

## 2014-11-08 DIAGNOSIS — I10 Essential (primary) hypertension: Secondary | ICD-10-CM | POA: Diagnosis not present

## 2014-11-08 DIAGNOSIS — E119 Type 2 diabetes mellitus without complications: Secondary | ICD-10-CM | POA: Diagnosis not present

## 2014-11-08 DIAGNOSIS — I739 Peripheral vascular disease, unspecified: Secondary | ICD-10-CM | POA: Diagnosis not present

## 2014-11-09 DIAGNOSIS — I872 Venous insufficiency (chronic) (peripheral): Secondary | ICD-10-CM | POA: Diagnosis not present

## 2014-11-09 DIAGNOSIS — I10 Essential (primary) hypertension: Secondary | ICD-10-CM | POA: Diagnosis not present

## 2014-11-09 DIAGNOSIS — L97819 Non-pressure chronic ulcer of other part of right lower leg with unspecified severity: Secondary | ICD-10-CM | POA: Diagnosis not present

## 2014-11-09 DIAGNOSIS — E119 Type 2 diabetes mellitus without complications: Secondary | ICD-10-CM | POA: Diagnosis not present

## 2014-11-09 DIAGNOSIS — M1990 Unspecified osteoarthritis, unspecified site: Secondary | ICD-10-CM | POA: Diagnosis not present

## 2014-11-09 DIAGNOSIS — I739 Peripheral vascular disease, unspecified: Secondary | ICD-10-CM | POA: Diagnosis not present

## 2014-11-13 DIAGNOSIS — L97819 Non-pressure chronic ulcer of other part of right lower leg with unspecified severity: Secondary | ICD-10-CM | POA: Diagnosis not present

## 2014-11-13 DIAGNOSIS — E119 Type 2 diabetes mellitus without complications: Secondary | ICD-10-CM | POA: Diagnosis not present

## 2014-11-13 DIAGNOSIS — I10 Essential (primary) hypertension: Secondary | ICD-10-CM | POA: Diagnosis not present

## 2014-11-13 DIAGNOSIS — M1990 Unspecified osteoarthritis, unspecified site: Secondary | ICD-10-CM | POA: Diagnosis not present

## 2014-11-13 DIAGNOSIS — I872 Venous insufficiency (chronic) (peripheral): Secondary | ICD-10-CM | POA: Diagnosis not present

## 2014-11-13 DIAGNOSIS — I739 Peripheral vascular disease, unspecified: Secondary | ICD-10-CM | POA: Diagnosis not present

## 2014-11-14 ENCOUNTER — Ambulatory Visit: Payer: Medicare Other | Admitting: Podiatry

## 2014-11-14 ENCOUNTER — Encounter (HOSPITAL_BASED_OUTPATIENT_CLINIC_OR_DEPARTMENT_OTHER): Payer: Medicare Other | Attending: General Surgery

## 2014-11-14 DIAGNOSIS — I87313 Chronic venous hypertension (idiopathic) with ulcer of bilateral lower extremity: Secondary | ICD-10-CM | POA: Insufficient documentation

## 2014-11-14 DIAGNOSIS — M109 Gout, unspecified: Secondary | ICD-10-CM | POA: Insufficient documentation

## 2014-11-14 DIAGNOSIS — M1990 Unspecified osteoarthritis, unspecified site: Secondary | ICD-10-CM | POA: Diagnosis not present

## 2014-11-14 DIAGNOSIS — L97221 Non-pressure chronic ulcer of left calf limited to breakdown of skin: Secondary | ICD-10-CM | POA: Diagnosis present

## 2014-11-14 DIAGNOSIS — L97211 Non-pressure chronic ulcer of right calf limited to breakdown of skin: Secondary | ICD-10-CM | POA: Diagnosis not present

## 2014-11-14 DIAGNOSIS — I10 Essential (primary) hypertension: Secondary | ICD-10-CM | POA: Diagnosis not present

## 2014-11-14 DIAGNOSIS — E11622 Type 2 diabetes mellitus with other skin ulcer: Secondary | ICD-10-CM | POA: Insufficient documentation

## 2014-11-17 DIAGNOSIS — E119 Type 2 diabetes mellitus without complications: Secondary | ICD-10-CM | POA: Diagnosis not present

## 2014-11-17 DIAGNOSIS — I10 Essential (primary) hypertension: Secondary | ICD-10-CM | POA: Diagnosis not present

## 2014-11-17 DIAGNOSIS — I872 Venous insufficiency (chronic) (peripheral): Secondary | ICD-10-CM | POA: Diagnosis not present

## 2014-11-17 DIAGNOSIS — I739 Peripheral vascular disease, unspecified: Secondary | ICD-10-CM | POA: Diagnosis not present

## 2014-11-17 DIAGNOSIS — M1990 Unspecified osteoarthritis, unspecified site: Secondary | ICD-10-CM | POA: Diagnosis not present

## 2014-11-17 DIAGNOSIS — L97819 Non-pressure chronic ulcer of other part of right lower leg with unspecified severity: Secondary | ICD-10-CM | POA: Diagnosis not present

## 2014-11-20 DIAGNOSIS — I10 Essential (primary) hypertension: Secondary | ICD-10-CM | POA: Diagnosis not present

## 2014-11-20 DIAGNOSIS — I739 Peripheral vascular disease, unspecified: Secondary | ICD-10-CM | POA: Diagnosis not present

## 2014-11-20 DIAGNOSIS — I872 Venous insufficiency (chronic) (peripheral): Secondary | ICD-10-CM | POA: Diagnosis not present

## 2014-11-20 DIAGNOSIS — L97819 Non-pressure chronic ulcer of other part of right lower leg with unspecified severity: Secondary | ICD-10-CM | POA: Diagnosis not present

## 2014-11-20 DIAGNOSIS — M1990 Unspecified osteoarthritis, unspecified site: Secondary | ICD-10-CM | POA: Diagnosis not present

## 2014-11-20 DIAGNOSIS — E119 Type 2 diabetes mellitus without complications: Secondary | ICD-10-CM | POA: Diagnosis not present

## 2014-11-21 DIAGNOSIS — I87313 Chronic venous hypertension (idiopathic) with ulcer of bilateral lower extremity: Secondary | ICD-10-CM | POA: Diagnosis not present

## 2014-11-21 DIAGNOSIS — M1990 Unspecified osteoarthritis, unspecified site: Secondary | ICD-10-CM | POA: Diagnosis not present

## 2014-11-21 DIAGNOSIS — L97211 Non-pressure chronic ulcer of right calf limited to breakdown of skin: Secondary | ICD-10-CM | POA: Diagnosis not present

## 2014-11-21 DIAGNOSIS — I10 Essential (primary) hypertension: Secondary | ICD-10-CM | POA: Diagnosis not present

## 2014-11-21 DIAGNOSIS — M109 Gout, unspecified: Secondary | ICD-10-CM | POA: Diagnosis not present

## 2014-11-21 DIAGNOSIS — L97221 Non-pressure chronic ulcer of left calf limited to breakdown of skin: Secondary | ICD-10-CM | POA: Diagnosis not present

## 2014-11-22 DIAGNOSIS — I739 Peripheral vascular disease, unspecified: Secondary | ICD-10-CM | POA: Diagnosis not present

## 2014-11-22 DIAGNOSIS — E119 Type 2 diabetes mellitus without complications: Secondary | ICD-10-CM | POA: Diagnosis not present

## 2014-11-22 DIAGNOSIS — I10 Essential (primary) hypertension: Secondary | ICD-10-CM | POA: Diagnosis not present

## 2014-11-22 DIAGNOSIS — M1990 Unspecified osteoarthritis, unspecified site: Secondary | ICD-10-CM | POA: Diagnosis not present

## 2014-11-22 DIAGNOSIS — L97819 Non-pressure chronic ulcer of other part of right lower leg with unspecified severity: Secondary | ICD-10-CM | POA: Diagnosis not present

## 2014-11-22 DIAGNOSIS — I872 Venous insufficiency (chronic) (peripheral): Secondary | ICD-10-CM | POA: Diagnosis not present

## 2014-11-23 ENCOUNTER — Ambulatory Visit: Payer: Medicare Other | Admitting: Podiatry

## 2014-11-24 DIAGNOSIS — M1990 Unspecified osteoarthritis, unspecified site: Secondary | ICD-10-CM | POA: Diagnosis not present

## 2014-11-24 DIAGNOSIS — I872 Venous insufficiency (chronic) (peripheral): Secondary | ICD-10-CM | POA: Diagnosis not present

## 2014-11-24 DIAGNOSIS — I739 Peripheral vascular disease, unspecified: Secondary | ICD-10-CM | POA: Diagnosis not present

## 2014-11-24 DIAGNOSIS — E119 Type 2 diabetes mellitus without complications: Secondary | ICD-10-CM | POA: Diagnosis not present

## 2014-11-24 DIAGNOSIS — I10 Essential (primary) hypertension: Secondary | ICD-10-CM | POA: Diagnosis not present

## 2014-11-24 DIAGNOSIS — L97819 Non-pressure chronic ulcer of other part of right lower leg with unspecified severity: Secondary | ICD-10-CM | POA: Diagnosis not present

## 2014-11-27 DIAGNOSIS — M1990 Unspecified osteoarthritis, unspecified site: Secondary | ICD-10-CM | POA: Diagnosis not present

## 2014-11-27 DIAGNOSIS — I872 Venous insufficiency (chronic) (peripheral): Secondary | ICD-10-CM | POA: Diagnosis not present

## 2014-11-27 DIAGNOSIS — L97819 Non-pressure chronic ulcer of other part of right lower leg with unspecified severity: Secondary | ICD-10-CM | POA: Diagnosis not present

## 2014-11-27 DIAGNOSIS — E119 Type 2 diabetes mellitus without complications: Secondary | ICD-10-CM | POA: Diagnosis not present

## 2014-11-27 DIAGNOSIS — I739 Peripheral vascular disease, unspecified: Secondary | ICD-10-CM | POA: Diagnosis not present

## 2014-11-27 DIAGNOSIS — I10 Essential (primary) hypertension: Secondary | ICD-10-CM | POA: Diagnosis not present

## 2014-11-28 DIAGNOSIS — L97211 Non-pressure chronic ulcer of right calf limited to breakdown of skin: Secondary | ICD-10-CM | POA: Diagnosis not present

## 2014-11-28 DIAGNOSIS — M109 Gout, unspecified: Secondary | ICD-10-CM | POA: Diagnosis not present

## 2014-11-28 DIAGNOSIS — M1990 Unspecified osteoarthritis, unspecified site: Secondary | ICD-10-CM | POA: Diagnosis not present

## 2014-11-28 DIAGNOSIS — I10 Essential (primary) hypertension: Secondary | ICD-10-CM | POA: Diagnosis not present

## 2014-11-28 DIAGNOSIS — I87313 Chronic venous hypertension (idiopathic) with ulcer of bilateral lower extremity: Secondary | ICD-10-CM | POA: Diagnosis not present

## 2014-11-28 DIAGNOSIS — L97221 Non-pressure chronic ulcer of left calf limited to breakdown of skin: Secondary | ICD-10-CM | POA: Diagnosis not present

## 2014-11-28 LAB — GLUCOSE, CAPILLARY: Glucose-Capillary: 170 mg/dL — ABNORMAL HIGH (ref 65–99)

## 2014-12-01 DIAGNOSIS — I872 Venous insufficiency (chronic) (peripheral): Secondary | ICD-10-CM | POA: Diagnosis not present

## 2014-12-01 DIAGNOSIS — E119 Type 2 diabetes mellitus without complications: Secondary | ICD-10-CM | POA: Diagnosis not present

## 2014-12-01 DIAGNOSIS — I10 Essential (primary) hypertension: Secondary | ICD-10-CM | POA: Diagnosis not present

## 2014-12-01 DIAGNOSIS — I739 Peripheral vascular disease, unspecified: Secondary | ICD-10-CM | POA: Diagnosis not present

## 2014-12-01 DIAGNOSIS — L97819 Non-pressure chronic ulcer of other part of right lower leg with unspecified severity: Secondary | ICD-10-CM | POA: Diagnosis not present

## 2014-12-01 DIAGNOSIS — M1990 Unspecified osteoarthritis, unspecified site: Secondary | ICD-10-CM | POA: Diagnosis not present

## 2014-12-04 DIAGNOSIS — E119 Type 2 diabetes mellitus without complications: Secondary | ICD-10-CM | POA: Diagnosis not present

## 2014-12-04 DIAGNOSIS — I10 Essential (primary) hypertension: Secondary | ICD-10-CM | POA: Diagnosis not present

## 2014-12-04 DIAGNOSIS — I872 Venous insufficiency (chronic) (peripheral): Secondary | ICD-10-CM | POA: Diagnosis not present

## 2014-12-04 DIAGNOSIS — L97819 Non-pressure chronic ulcer of other part of right lower leg with unspecified severity: Secondary | ICD-10-CM | POA: Diagnosis not present

## 2014-12-04 DIAGNOSIS — I739 Peripheral vascular disease, unspecified: Secondary | ICD-10-CM | POA: Diagnosis not present

## 2014-12-04 DIAGNOSIS — M1990 Unspecified osteoarthritis, unspecified site: Secondary | ICD-10-CM | POA: Diagnosis not present

## 2014-12-05 DIAGNOSIS — I10 Essential (primary) hypertension: Secondary | ICD-10-CM | POA: Diagnosis not present

## 2014-12-05 DIAGNOSIS — I87313 Chronic venous hypertension (idiopathic) with ulcer of bilateral lower extremity: Secondary | ICD-10-CM | POA: Diagnosis not present

## 2014-12-05 DIAGNOSIS — L97211 Non-pressure chronic ulcer of right calf limited to breakdown of skin: Secondary | ICD-10-CM | POA: Diagnosis not present

## 2014-12-05 DIAGNOSIS — M1990 Unspecified osteoarthritis, unspecified site: Secondary | ICD-10-CM | POA: Diagnosis not present

## 2014-12-05 DIAGNOSIS — L97221 Non-pressure chronic ulcer of left calf limited to breakdown of skin: Secondary | ICD-10-CM | POA: Diagnosis not present

## 2014-12-05 DIAGNOSIS — M109 Gout, unspecified: Secondary | ICD-10-CM | POA: Diagnosis not present

## 2014-12-08 DIAGNOSIS — I10 Essential (primary) hypertension: Secondary | ICD-10-CM | POA: Diagnosis not present

## 2014-12-08 DIAGNOSIS — L97819 Non-pressure chronic ulcer of other part of right lower leg with unspecified severity: Secondary | ICD-10-CM | POA: Diagnosis not present

## 2014-12-08 DIAGNOSIS — I872 Venous insufficiency (chronic) (peripheral): Secondary | ICD-10-CM | POA: Diagnosis not present

## 2014-12-08 DIAGNOSIS — M1990 Unspecified osteoarthritis, unspecified site: Secondary | ICD-10-CM | POA: Diagnosis not present

## 2014-12-08 DIAGNOSIS — I739 Peripheral vascular disease, unspecified: Secondary | ICD-10-CM | POA: Diagnosis not present

## 2014-12-08 DIAGNOSIS — E119 Type 2 diabetes mellitus without complications: Secondary | ICD-10-CM | POA: Diagnosis not present

## 2014-12-11 DIAGNOSIS — I739 Peripheral vascular disease, unspecified: Secondary | ICD-10-CM | POA: Diagnosis not present

## 2014-12-11 DIAGNOSIS — M1990 Unspecified osteoarthritis, unspecified site: Secondary | ICD-10-CM | POA: Diagnosis not present

## 2014-12-11 DIAGNOSIS — E119 Type 2 diabetes mellitus without complications: Secondary | ICD-10-CM | POA: Diagnosis not present

## 2014-12-11 DIAGNOSIS — I10 Essential (primary) hypertension: Secondary | ICD-10-CM | POA: Diagnosis not present

## 2014-12-11 DIAGNOSIS — I872 Venous insufficiency (chronic) (peripheral): Secondary | ICD-10-CM | POA: Diagnosis not present

## 2014-12-11 DIAGNOSIS — L97819 Non-pressure chronic ulcer of other part of right lower leg with unspecified severity: Secondary | ICD-10-CM | POA: Diagnosis not present

## 2014-12-13 DIAGNOSIS — I872 Venous insufficiency (chronic) (peripheral): Secondary | ICD-10-CM | POA: Diagnosis not present

## 2014-12-13 DIAGNOSIS — M1990 Unspecified osteoarthritis, unspecified site: Secondary | ICD-10-CM | POA: Diagnosis not present

## 2014-12-13 DIAGNOSIS — L97819 Non-pressure chronic ulcer of other part of right lower leg with unspecified severity: Secondary | ICD-10-CM | POA: Diagnosis not present

## 2014-12-13 DIAGNOSIS — I739 Peripheral vascular disease, unspecified: Secondary | ICD-10-CM | POA: Diagnosis not present

## 2014-12-13 DIAGNOSIS — E119 Type 2 diabetes mellitus without complications: Secondary | ICD-10-CM | POA: Diagnosis not present

## 2014-12-13 DIAGNOSIS — I10 Essential (primary) hypertension: Secondary | ICD-10-CM | POA: Diagnosis not present

## 2014-12-15 DIAGNOSIS — L97819 Non-pressure chronic ulcer of other part of right lower leg with unspecified severity: Secondary | ICD-10-CM | POA: Diagnosis not present

## 2014-12-15 DIAGNOSIS — E119 Type 2 diabetes mellitus without complications: Secondary | ICD-10-CM | POA: Diagnosis not present

## 2014-12-15 DIAGNOSIS — I10 Essential (primary) hypertension: Secondary | ICD-10-CM | POA: Diagnosis not present

## 2014-12-15 DIAGNOSIS — I739 Peripheral vascular disease, unspecified: Secondary | ICD-10-CM | POA: Diagnosis not present

## 2014-12-15 DIAGNOSIS — M1990 Unspecified osteoarthritis, unspecified site: Secondary | ICD-10-CM | POA: Diagnosis not present

## 2014-12-15 DIAGNOSIS — I872 Venous insufficiency (chronic) (peripheral): Secondary | ICD-10-CM | POA: Diagnosis not present

## 2014-12-19 ENCOUNTER — Encounter (HOSPITAL_BASED_OUTPATIENT_CLINIC_OR_DEPARTMENT_OTHER): Payer: Medicare Other | Attending: General Surgery

## 2014-12-19 DIAGNOSIS — L97211 Non-pressure chronic ulcer of right calf limited to breakdown of skin: Secondary | ICD-10-CM | POA: Diagnosis present

## 2014-12-19 DIAGNOSIS — E11622 Type 2 diabetes mellitus with other skin ulcer: Secondary | ICD-10-CM | POA: Insufficient documentation

## 2014-12-19 DIAGNOSIS — M109 Gout, unspecified: Secondary | ICD-10-CM | POA: Diagnosis not present

## 2014-12-19 DIAGNOSIS — I739 Peripheral vascular disease, unspecified: Secondary | ICD-10-CM | POA: Insufficient documentation

## 2014-12-19 DIAGNOSIS — I87331 Chronic venous hypertension (idiopathic) with ulcer and inflammation of right lower extremity: Secondary | ICD-10-CM | POA: Insufficient documentation

## 2014-12-19 DIAGNOSIS — M1990 Unspecified osteoarthritis, unspecified site: Secondary | ICD-10-CM | POA: Insufficient documentation

## 2014-12-19 DIAGNOSIS — I10 Essential (primary) hypertension: Secondary | ICD-10-CM | POA: Diagnosis not present

## 2014-12-19 LAB — GLUCOSE, CAPILLARY: Glucose-Capillary: 136 mg/dL — ABNORMAL HIGH (ref 65–99)

## 2014-12-22 DIAGNOSIS — I739 Peripheral vascular disease, unspecified: Secondary | ICD-10-CM | POA: Diagnosis not present

## 2014-12-22 DIAGNOSIS — L97819 Non-pressure chronic ulcer of other part of right lower leg with unspecified severity: Secondary | ICD-10-CM | POA: Diagnosis not present

## 2014-12-22 DIAGNOSIS — M1990 Unspecified osteoarthritis, unspecified site: Secondary | ICD-10-CM | POA: Diagnosis not present

## 2014-12-22 DIAGNOSIS — I10 Essential (primary) hypertension: Secondary | ICD-10-CM | POA: Diagnosis not present

## 2014-12-22 DIAGNOSIS — E119 Type 2 diabetes mellitus without complications: Secondary | ICD-10-CM | POA: Diagnosis not present

## 2014-12-22 DIAGNOSIS — I872 Venous insufficiency (chronic) (peripheral): Secondary | ICD-10-CM | POA: Diagnosis not present

## 2014-12-25 DIAGNOSIS — L97212 Non-pressure chronic ulcer of right calf with fat layer exposed: Secondary | ICD-10-CM | POA: Diagnosis not present

## 2014-12-25 DIAGNOSIS — M109 Gout, unspecified: Secondary | ICD-10-CM | POA: Diagnosis not present

## 2014-12-25 DIAGNOSIS — I739 Peripheral vascular disease, unspecified: Secondary | ICD-10-CM | POA: Diagnosis not present

## 2014-12-25 DIAGNOSIS — E119 Type 2 diabetes mellitus without complications: Secondary | ICD-10-CM | POA: Diagnosis not present

## 2014-12-25 DIAGNOSIS — I10 Essential (primary) hypertension: Secondary | ICD-10-CM | POA: Diagnosis not present

## 2014-12-25 DIAGNOSIS — I872 Venous insufficiency (chronic) (peripheral): Secondary | ICD-10-CM | POA: Diagnosis not present

## 2014-12-27 DIAGNOSIS — I739 Peripheral vascular disease, unspecified: Secondary | ICD-10-CM | POA: Diagnosis not present

## 2014-12-27 DIAGNOSIS — M109 Gout, unspecified: Secondary | ICD-10-CM | POA: Diagnosis not present

## 2014-12-27 DIAGNOSIS — I872 Venous insufficiency (chronic) (peripheral): Secondary | ICD-10-CM | POA: Diagnosis not present

## 2014-12-27 DIAGNOSIS — I10 Essential (primary) hypertension: Secondary | ICD-10-CM | POA: Diagnosis not present

## 2014-12-27 DIAGNOSIS — E119 Type 2 diabetes mellitus without complications: Secondary | ICD-10-CM | POA: Diagnosis not present

## 2014-12-27 DIAGNOSIS — L97212 Non-pressure chronic ulcer of right calf with fat layer exposed: Secondary | ICD-10-CM | POA: Diagnosis not present

## 2014-12-29 DIAGNOSIS — L97212 Non-pressure chronic ulcer of right calf with fat layer exposed: Secondary | ICD-10-CM | POA: Diagnosis not present

## 2014-12-29 DIAGNOSIS — E119 Type 2 diabetes mellitus without complications: Secondary | ICD-10-CM | POA: Diagnosis not present

## 2014-12-29 DIAGNOSIS — I10 Essential (primary) hypertension: Secondary | ICD-10-CM | POA: Diagnosis not present

## 2014-12-29 DIAGNOSIS — I739 Peripheral vascular disease, unspecified: Secondary | ICD-10-CM | POA: Diagnosis not present

## 2014-12-29 DIAGNOSIS — I872 Venous insufficiency (chronic) (peripheral): Secondary | ICD-10-CM | POA: Diagnosis not present

## 2014-12-29 DIAGNOSIS — M109 Gout, unspecified: Secondary | ICD-10-CM | POA: Diagnosis not present

## 2015-01-01 DIAGNOSIS — I739 Peripheral vascular disease, unspecified: Secondary | ICD-10-CM | POA: Diagnosis not present

## 2015-01-01 DIAGNOSIS — E119 Type 2 diabetes mellitus without complications: Secondary | ICD-10-CM | POA: Diagnosis not present

## 2015-01-01 DIAGNOSIS — M109 Gout, unspecified: Secondary | ICD-10-CM | POA: Diagnosis not present

## 2015-01-01 DIAGNOSIS — I10 Essential (primary) hypertension: Secondary | ICD-10-CM | POA: Diagnosis not present

## 2015-01-01 DIAGNOSIS — L97212 Non-pressure chronic ulcer of right calf with fat layer exposed: Secondary | ICD-10-CM | POA: Diagnosis not present

## 2015-01-01 DIAGNOSIS — I872 Venous insufficiency (chronic) (peripheral): Secondary | ICD-10-CM | POA: Diagnosis not present

## 2015-01-02 DIAGNOSIS — I10 Essential (primary) hypertension: Secondary | ICD-10-CM | POA: Diagnosis not present

## 2015-01-02 DIAGNOSIS — M109 Gout, unspecified: Secondary | ICD-10-CM | POA: Diagnosis not present

## 2015-01-02 DIAGNOSIS — L97211 Non-pressure chronic ulcer of right calf limited to breakdown of skin: Secondary | ICD-10-CM | POA: Diagnosis not present

## 2015-01-02 DIAGNOSIS — M1990 Unspecified osteoarthritis, unspecified site: Secondary | ICD-10-CM | POA: Diagnosis not present

## 2015-01-02 DIAGNOSIS — I87331 Chronic venous hypertension (idiopathic) with ulcer and inflammation of right lower extremity: Secondary | ICD-10-CM | POA: Diagnosis not present

## 2015-01-02 DIAGNOSIS — I739 Peripheral vascular disease, unspecified: Secondary | ICD-10-CM | POA: Diagnosis not present

## 2015-01-03 DIAGNOSIS — I10 Essential (primary) hypertension: Secondary | ICD-10-CM | POA: Diagnosis not present

## 2015-01-03 DIAGNOSIS — I739 Peripheral vascular disease, unspecified: Secondary | ICD-10-CM | POA: Diagnosis not present

## 2015-01-03 DIAGNOSIS — I872 Venous insufficiency (chronic) (peripheral): Secondary | ICD-10-CM | POA: Diagnosis not present

## 2015-01-03 DIAGNOSIS — E119 Type 2 diabetes mellitus without complications: Secondary | ICD-10-CM | POA: Diagnosis not present

## 2015-01-03 DIAGNOSIS — M109 Gout, unspecified: Secondary | ICD-10-CM | POA: Diagnosis not present

## 2015-01-03 DIAGNOSIS — L97212 Non-pressure chronic ulcer of right calf with fat layer exposed: Secondary | ICD-10-CM | POA: Diagnosis not present

## 2015-01-04 ENCOUNTER — Ambulatory Visit: Payer: Medicare Other | Admitting: Podiatry

## 2015-01-04 ENCOUNTER — Ambulatory Visit (INDEPENDENT_AMBULATORY_CARE_PROVIDER_SITE_OTHER): Payer: Medicare Other | Admitting: Podiatry

## 2015-01-04 ENCOUNTER — Encounter: Payer: Self-pay | Admitting: Podiatry

## 2015-01-04 DIAGNOSIS — M79676 Pain in unspecified toe(s): Secondary | ICD-10-CM

## 2015-01-04 DIAGNOSIS — B351 Tinea unguium: Secondary | ICD-10-CM | POA: Diagnosis not present

## 2015-01-04 NOTE — Progress Notes (Signed)
She presents today chief complaint of painful elongated toenails only to have them cut. She states they bother her walking and with her compression hose.  Objective: Vital signs are stable she is alert and oriented 3 bilateral edema is noted. Pulses are palpable bilateral. Her nails are thick yellow dystrophic clinic for mycotic and painful palpation.  Assessment: Pain in limb secondary to onychomycosis 1 through 5 bilateral.  Plan: Debridement of nails 1 through 5 bilateral. Follow up with her in 3 months.  Roselind Messier DPM

## 2015-01-05 DIAGNOSIS — L97212 Non-pressure chronic ulcer of right calf with fat layer exposed: Secondary | ICD-10-CM | POA: Diagnosis not present

## 2015-01-05 DIAGNOSIS — I739 Peripheral vascular disease, unspecified: Secondary | ICD-10-CM | POA: Diagnosis not present

## 2015-01-05 DIAGNOSIS — I10 Essential (primary) hypertension: Secondary | ICD-10-CM | POA: Diagnosis not present

## 2015-01-05 DIAGNOSIS — I872 Venous insufficiency (chronic) (peripheral): Secondary | ICD-10-CM | POA: Diagnosis not present

## 2015-01-05 DIAGNOSIS — M109 Gout, unspecified: Secondary | ICD-10-CM | POA: Diagnosis not present

## 2015-01-05 DIAGNOSIS — E119 Type 2 diabetes mellitus without complications: Secondary | ICD-10-CM | POA: Diagnosis not present

## 2015-01-08 DIAGNOSIS — I739 Peripheral vascular disease, unspecified: Secondary | ICD-10-CM | POA: Diagnosis not present

## 2015-01-08 DIAGNOSIS — E119 Type 2 diabetes mellitus without complications: Secondary | ICD-10-CM | POA: Diagnosis not present

## 2015-01-08 DIAGNOSIS — M109 Gout, unspecified: Secondary | ICD-10-CM | POA: Diagnosis not present

## 2015-01-08 DIAGNOSIS — I872 Venous insufficiency (chronic) (peripheral): Secondary | ICD-10-CM | POA: Diagnosis not present

## 2015-01-08 DIAGNOSIS — L97212 Non-pressure chronic ulcer of right calf with fat layer exposed: Secondary | ICD-10-CM | POA: Diagnosis not present

## 2015-01-08 DIAGNOSIS — I10 Essential (primary) hypertension: Secondary | ICD-10-CM | POA: Diagnosis not present

## 2015-01-09 DIAGNOSIS — L97211 Non-pressure chronic ulcer of right calf limited to breakdown of skin: Secondary | ICD-10-CM | POA: Diagnosis not present

## 2015-01-09 DIAGNOSIS — M109 Gout, unspecified: Secondary | ICD-10-CM | POA: Diagnosis not present

## 2015-01-09 DIAGNOSIS — M1990 Unspecified osteoarthritis, unspecified site: Secondary | ICD-10-CM | POA: Diagnosis not present

## 2015-01-09 DIAGNOSIS — I87331 Chronic venous hypertension (idiopathic) with ulcer and inflammation of right lower extremity: Secondary | ICD-10-CM | POA: Diagnosis not present

## 2015-01-09 DIAGNOSIS — I739 Peripheral vascular disease, unspecified: Secondary | ICD-10-CM | POA: Diagnosis not present

## 2015-01-09 DIAGNOSIS — I10 Essential (primary) hypertension: Secondary | ICD-10-CM | POA: Diagnosis not present

## 2015-01-12 DIAGNOSIS — L97212 Non-pressure chronic ulcer of right calf with fat layer exposed: Secondary | ICD-10-CM | POA: Diagnosis not present

## 2015-01-12 DIAGNOSIS — I739 Peripheral vascular disease, unspecified: Secondary | ICD-10-CM | POA: Diagnosis not present

## 2015-01-12 DIAGNOSIS — E119 Type 2 diabetes mellitus without complications: Secondary | ICD-10-CM | POA: Diagnosis not present

## 2015-01-12 DIAGNOSIS — I872 Venous insufficiency (chronic) (peripheral): Secondary | ICD-10-CM | POA: Diagnosis not present

## 2015-01-12 DIAGNOSIS — M109 Gout, unspecified: Secondary | ICD-10-CM | POA: Diagnosis not present

## 2015-01-12 DIAGNOSIS — I10 Essential (primary) hypertension: Secondary | ICD-10-CM | POA: Diagnosis not present

## 2015-01-15 DIAGNOSIS — I739 Peripheral vascular disease, unspecified: Secondary | ICD-10-CM | POA: Diagnosis not present

## 2015-01-15 DIAGNOSIS — E119 Type 2 diabetes mellitus without complications: Secondary | ICD-10-CM | POA: Diagnosis not present

## 2015-01-15 DIAGNOSIS — I10 Essential (primary) hypertension: Secondary | ICD-10-CM | POA: Diagnosis not present

## 2015-01-15 DIAGNOSIS — I872 Venous insufficiency (chronic) (peripheral): Secondary | ICD-10-CM | POA: Diagnosis not present

## 2015-01-15 DIAGNOSIS — M109 Gout, unspecified: Secondary | ICD-10-CM | POA: Diagnosis not present

## 2015-01-15 DIAGNOSIS — L97212 Non-pressure chronic ulcer of right calf with fat layer exposed: Secondary | ICD-10-CM | POA: Diagnosis not present

## 2015-01-16 ENCOUNTER — Encounter (HOSPITAL_BASED_OUTPATIENT_CLINIC_OR_DEPARTMENT_OTHER): Payer: Medicare Other | Attending: General Surgery

## 2015-01-16 DIAGNOSIS — I10 Essential (primary) hypertension: Secondary | ICD-10-CM | POA: Diagnosis not present

## 2015-01-16 DIAGNOSIS — M109 Gout, unspecified: Secondary | ICD-10-CM | POA: Insufficient documentation

## 2015-01-16 DIAGNOSIS — I739 Peripheral vascular disease, unspecified: Secondary | ICD-10-CM | POA: Diagnosis not present

## 2015-01-16 DIAGNOSIS — L97811 Non-pressure chronic ulcer of other part of right lower leg limited to breakdown of skin: Secondary | ICD-10-CM | POA: Insufficient documentation

## 2015-01-16 DIAGNOSIS — E1151 Type 2 diabetes mellitus with diabetic peripheral angiopathy without gangrene: Secondary | ICD-10-CM | POA: Insufficient documentation

## 2015-01-16 DIAGNOSIS — L97821 Non-pressure chronic ulcer of other part of left lower leg limited to breakdown of skin: Secondary | ICD-10-CM | POA: Insufficient documentation

## 2015-01-16 DIAGNOSIS — E1136 Type 2 diabetes mellitus with diabetic cataract: Secondary | ICD-10-CM | POA: Diagnosis not present

## 2015-01-16 DIAGNOSIS — I872 Venous insufficiency (chronic) (peripheral): Secondary | ICD-10-CM | POA: Diagnosis not present

## 2015-01-16 DIAGNOSIS — E119 Type 2 diabetes mellitus without complications: Secondary | ICD-10-CM | POA: Diagnosis not present

## 2015-01-16 DIAGNOSIS — L97212 Non-pressure chronic ulcer of right calf with fat layer exposed: Secondary | ICD-10-CM | POA: Diagnosis not present

## 2015-01-16 DIAGNOSIS — M1990 Unspecified osteoarthritis, unspecified site: Secondary | ICD-10-CM | POA: Insufficient documentation

## 2015-01-17 DIAGNOSIS — I739 Peripheral vascular disease, unspecified: Secondary | ICD-10-CM | POA: Diagnosis not present

## 2015-01-17 DIAGNOSIS — I872 Venous insufficiency (chronic) (peripheral): Secondary | ICD-10-CM | POA: Diagnosis not present

## 2015-01-17 DIAGNOSIS — M109 Gout, unspecified: Secondary | ICD-10-CM | POA: Diagnosis not present

## 2015-01-17 DIAGNOSIS — E119 Type 2 diabetes mellitus without complications: Secondary | ICD-10-CM | POA: Diagnosis not present

## 2015-01-17 DIAGNOSIS — I10 Essential (primary) hypertension: Secondary | ICD-10-CM | POA: Diagnosis not present

## 2015-01-17 DIAGNOSIS — L97212 Non-pressure chronic ulcer of right calf with fat layer exposed: Secondary | ICD-10-CM | POA: Diagnosis not present

## 2015-01-22 DIAGNOSIS — I10 Essential (primary) hypertension: Secondary | ICD-10-CM | POA: Diagnosis not present

## 2015-01-22 DIAGNOSIS — L97212 Non-pressure chronic ulcer of right calf with fat layer exposed: Secondary | ICD-10-CM | POA: Diagnosis not present

## 2015-01-22 DIAGNOSIS — I739 Peripheral vascular disease, unspecified: Secondary | ICD-10-CM | POA: Diagnosis not present

## 2015-01-22 DIAGNOSIS — M109 Gout, unspecified: Secondary | ICD-10-CM | POA: Diagnosis not present

## 2015-01-22 DIAGNOSIS — I872 Venous insufficiency (chronic) (peripheral): Secondary | ICD-10-CM | POA: Diagnosis not present

## 2015-01-22 DIAGNOSIS — E119 Type 2 diabetes mellitus without complications: Secondary | ICD-10-CM | POA: Diagnosis not present

## 2015-01-23 DIAGNOSIS — M1990 Unspecified osteoarthritis, unspecified site: Secondary | ICD-10-CM | POA: Diagnosis not present

## 2015-01-23 DIAGNOSIS — L97811 Non-pressure chronic ulcer of other part of right lower leg limited to breakdown of skin: Secondary | ICD-10-CM | POA: Diagnosis not present

## 2015-01-23 DIAGNOSIS — E1151 Type 2 diabetes mellitus with diabetic peripheral angiopathy without gangrene: Secondary | ICD-10-CM | POA: Diagnosis not present

## 2015-01-23 DIAGNOSIS — I10 Essential (primary) hypertension: Secondary | ICD-10-CM | POA: Diagnosis not present

## 2015-01-23 DIAGNOSIS — E1136 Type 2 diabetes mellitus with diabetic cataract: Secondary | ICD-10-CM | POA: Diagnosis not present

## 2015-01-23 DIAGNOSIS — L97821 Non-pressure chronic ulcer of other part of left lower leg limited to breakdown of skin: Secondary | ICD-10-CM | POA: Diagnosis not present

## 2015-01-24 DIAGNOSIS — I10 Essential (primary) hypertension: Secondary | ICD-10-CM | POA: Diagnosis not present

## 2015-01-24 DIAGNOSIS — L97212 Non-pressure chronic ulcer of right calf with fat layer exposed: Secondary | ICD-10-CM | POA: Diagnosis not present

## 2015-01-24 DIAGNOSIS — E119 Type 2 diabetes mellitus without complications: Secondary | ICD-10-CM | POA: Diagnosis not present

## 2015-01-24 DIAGNOSIS — M109 Gout, unspecified: Secondary | ICD-10-CM | POA: Diagnosis not present

## 2015-01-24 DIAGNOSIS — I872 Venous insufficiency (chronic) (peripheral): Secondary | ICD-10-CM | POA: Diagnosis not present

## 2015-01-24 DIAGNOSIS — I739 Peripheral vascular disease, unspecified: Secondary | ICD-10-CM | POA: Diagnosis not present

## 2015-01-26 DIAGNOSIS — I739 Peripheral vascular disease, unspecified: Secondary | ICD-10-CM | POA: Diagnosis not present

## 2015-01-26 DIAGNOSIS — I10 Essential (primary) hypertension: Secondary | ICD-10-CM | POA: Diagnosis not present

## 2015-01-26 DIAGNOSIS — E119 Type 2 diabetes mellitus without complications: Secondary | ICD-10-CM | POA: Diagnosis not present

## 2015-01-26 DIAGNOSIS — M109 Gout, unspecified: Secondary | ICD-10-CM | POA: Diagnosis not present

## 2015-01-26 DIAGNOSIS — I872 Venous insufficiency (chronic) (peripheral): Secondary | ICD-10-CM | POA: Diagnosis not present

## 2015-01-26 DIAGNOSIS — L97212 Non-pressure chronic ulcer of right calf with fat layer exposed: Secondary | ICD-10-CM | POA: Diagnosis not present

## 2015-01-29 DIAGNOSIS — I10 Essential (primary) hypertension: Secondary | ICD-10-CM | POA: Diagnosis not present

## 2015-01-29 DIAGNOSIS — E119 Type 2 diabetes mellitus without complications: Secondary | ICD-10-CM | POA: Diagnosis not present

## 2015-01-29 DIAGNOSIS — M109 Gout, unspecified: Secondary | ICD-10-CM | POA: Diagnosis not present

## 2015-01-29 DIAGNOSIS — I872 Venous insufficiency (chronic) (peripheral): Secondary | ICD-10-CM | POA: Diagnosis not present

## 2015-01-29 DIAGNOSIS — L97212 Non-pressure chronic ulcer of right calf with fat layer exposed: Secondary | ICD-10-CM | POA: Diagnosis not present

## 2015-01-29 DIAGNOSIS — I739 Peripheral vascular disease, unspecified: Secondary | ICD-10-CM | POA: Diagnosis not present

## 2015-01-30 DIAGNOSIS — L97821 Non-pressure chronic ulcer of other part of left lower leg limited to breakdown of skin: Secondary | ICD-10-CM | POA: Diagnosis not present

## 2015-01-30 DIAGNOSIS — L97811 Non-pressure chronic ulcer of other part of right lower leg limited to breakdown of skin: Secondary | ICD-10-CM | POA: Diagnosis not present

## 2015-01-30 DIAGNOSIS — E1136 Type 2 diabetes mellitus with diabetic cataract: Secondary | ICD-10-CM | POA: Diagnosis not present

## 2015-01-30 DIAGNOSIS — E1151 Type 2 diabetes mellitus with diabetic peripheral angiopathy without gangrene: Secondary | ICD-10-CM | POA: Diagnosis not present

## 2015-01-30 DIAGNOSIS — I10 Essential (primary) hypertension: Secondary | ICD-10-CM | POA: Diagnosis not present

## 2015-01-30 DIAGNOSIS — M1990 Unspecified osteoarthritis, unspecified site: Secondary | ICD-10-CM | POA: Diagnosis not present

## 2015-01-31 DIAGNOSIS — I10 Essential (primary) hypertension: Secondary | ICD-10-CM | POA: Diagnosis not present

## 2015-01-31 DIAGNOSIS — M109 Gout, unspecified: Secondary | ICD-10-CM | POA: Diagnosis not present

## 2015-01-31 DIAGNOSIS — I872 Venous insufficiency (chronic) (peripheral): Secondary | ICD-10-CM | POA: Diagnosis not present

## 2015-01-31 DIAGNOSIS — E119 Type 2 diabetes mellitus without complications: Secondary | ICD-10-CM | POA: Diagnosis not present

## 2015-01-31 DIAGNOSIS — L97212 Non-pressure chronic ulcer of right calf with fat layer exposed: Secondary | ICD-10-CM | POA: Diagnosis not present

## 2015-01-31 DIAGNOSIS — I739 Peripheral vascular disease, unspecified: Secondary | ICD-10-CM | POA: Diagnosis not present

## 2015-02-02 DIAGNOSIS — I10 Essential (primary) hypertension: Secondary | ICD-10-CM | POA: Diagnosis not present

## 2015-02-02 DIAGNOSIS — I739 Peripheral vascular disease, unspecified: Secondary | ICD-10-CM | POA: Diagnosis not present

## 2015-02-02 DIAGNOSIS — I872 Venous insufficiency (chronic) (peripheral): Secondary | ICD-10-CM | POA: Diagnosis not present

## 2015-02-02 DIAGNOSIS — E119 Type 2 diabetes mellitus without complications: Secondary | ICD-10-CM | POA: Diagnosis not present

## 2015-02-02 DIAGNOSIS — M109 Gout, unspecified: Secondary | ICD-10-CM | POA: Diagnosis not present

## 2015-02-02 DIAGNOSIS — L97212 Non-pressure chronic ulcer of right calf with fat layer exposed: Secondary | ICD-10-CM | POA: Diagnosis not present

## 2015-02-05 DIAGNOSIS — I739 Peripheral vascular disease, unspecified: Secondary | ICD-10-CM | POA: Diagnosis not present

## 2015-02-05 DIAGNOSIS — I872 Venous insufficiency (chronic) (peripheral): Secondary | ICD-10-CM | POA: Diagnosis not present

## 2015-02-05 DIAGNOSIS — I10 Essential (primary) hypertension: Secondary | ICD-10-CM | POA: Diagnosis not present

## 2015-02-05 DIAGNOSIS — M109 Gout, unspecified: Secondary | ICD-10-CM | POA: Diagnosis not present

## 2015-02-05 DIAGNOSIS — L97212 Non-pressure chronic ulcer of right calf with fat layer exposed: Secondary | ICD-10-CM | POA: Diagnosis not present

## 2015-02-05 DIAGNOSIS — E119 Type 2 diabetes mellitus without complications: Secondary | ICD-10-CM | POA: Diagnosis not present

## 2015-02-06 DIAGNOSIS — I10 Essential (primary) hypertension: Secondary | ICD-10-CM | POA: Diagnosis not present

## 2015-02-06 DIAGNOSIS — M1990 Unspecified osteoarthritis, unspecified site: Secondary | ICD-10-CM | POA: Diagnosis not present

## 2015-02-06 DIAGNOSIS — E1136 Type 2 diabetes mellitus with diabetic cataract: Secondary | ICD-10-CM | POA: Diagnosis not present

## 2015-02-06 DIAGNOSIS — L97821 Non-pressure chronic ulcer of other part of left lower leg limited to breakdown of skin: Secondary | ICD-10-CM | POA: Diagnosis not present

## 2015-02-06 DIAGNOSIS — L97811 Non-pressure chronic ulcer of other part of right lower leg limited to breakdown of skin: Secondary | ICD-10-CM | POA: Diagnosis not present

## 2015-02-06 DIAGNOSIS — E1151 Type 2 diabetes mellitus with diabetic peripheral angiopathy without gangrene: Secondary | ICD-10-CM | POA: Diagnosis not present

## 2015-02-06 LAB — GLUCOSE, CAPILLARY: GLUCOSE-CAPILLARY: 147 mg/dL — AB (ref 65–99)

## 2015-02-07 DIAGNOSIS — E119 Type 2 diabetes mellitus without complications: Secondary | ICD-10-CM | POA: Diagnosis not present

## 2015-02-07 DIAGNOSIS — L97212 Non-pressure chronic ulcer of right calf with fat layer exposed: Secondary | ICD-10-CM | POA: Diagnosis not present

## 2015-02-07 DIAGNOSIS — M109 Gout, unspecified: Secondary | ICD-10-CM | POA: Diagnosis not present

## 2015-02-07 DIAGNOSIS — I10 Essential (primary) hypertension: Secondary | ICD-10-CM | POA: Diagnosis not present

## 2015-02-07 DIAGNOSIS — I739 Peripheral vascular disease, unspecified: Secondary | ICD-10-CM | POA: Diagnosis not present

## 2015-02-07 DIAGNOSIS — I872 Venous insufficiency (chronic) (peripheral): Secondary | ICD-10-CM | POA: Diagnosis not present

## 2015-02-09 DIAGNOSIS — I10 Essential (primary) hypertension: Secondary | ICD-10-CM | POA: Diagnosis not present

## 2015-02-09 DIAGNOSIS — M109 Gout, unspecified: Secondary | ICD-10-CM | POA: Diagnosis not present

## 2015-02-09 DIAGNOSIS — E119 Type 2 diabetes mellitus without complications: Secondary | ICD-10-CM | POA: Diagnosis not present

## 2015-02-09 DIAGNOSIS — L97212 Non-pressure chronic ulcer of right calf with fat layer exposed: Secondary | ICD-10-CM | POA: Diagnosis not present

## 2015-02-09 DIAGNOSIS — I739 Peripheral vascular disease, unspecified: Secondary | ICD-10-CM | POA: Diagnosis not present

## 2015-02-09 DIAGNOSIS — I872 Venous insufficiency (chronic) (peripheral): Secondary | ICD-10-CM | POA: Diagnosis not present

## 2015-02-12 DIAGNOSIS — I872 Venous insufficiency (chronic) (peripheral): Secondary | ICD-10-CM | POA: Diagnosis not present

## 2015-02-12 DIAGNOSIS — L97212 Non-pressure chronic ulcer of right calf with fat layer exposed: Secondary | ICD-10-CM | POA: Diagnosis not present

## 2015-02-12 DIAGNOSIS — M109 Gout, unspecified: Secondary | ICD-10-CM | POA: Diagnosis not present

## 2015-02-12 DIAGNOSIS — I10 Essential (primary) hypertension: Secondary | ICD-10-CM | POA: Diagnosis not present

## 2015-02-12 DIAGNOSIS — I739 Peripheral vascular disease, unspecified: Secondary | ICD-10-CM | POA: Diagnosis not present

## 2015-02-12 DIAGNOSIS — E119 Type 2 diabetes mellitus without complications: Secondary | ICD-10-CM | POA: Diagnosis not present

## 2015-02-14 DIAGNOSIS — L97212 Non-pressure chronic ulcer of right calf with fat layer exposed: Secondary | ICD-10-CM | POA: Diagnosis not present

## 2015-02-14 DIAGNOSIS — I739 Peripheral vascular disease, unspecified: Secondary | ICD-10-CM | POA: Diagnosis not present

## 2015-02-14 DIAGNOSIS — E119 Type 2 diabetes mellitus without complications: Secondary | ICD-10-CM | POA: Diagnosis not present

## 2015-02-14 DIAGNOSIS — I10 Essential (primary) hypertension: Secondary | ICD-10-CM | POA: Diagnosis not present

## 2015-02-14 DIAGNOSIS — I872 Venous insufficiency (chronic) (peripheral): Secondary | ICD-10-CM | POA: Diagnosis not present

## 2015-02-14 DIAGNOSIS — M109 Gout, unspecified: Secondary | ICD-10-CM | POA: Diagnosis not present

## 2015-02-16 DIAGNOSIS — M109 Gout, unspecified: Secondary | ICD-10-CM | POA: Diagnosis not present

## 2015-02-16 DIAGNOSIS — I739 Peripheral vascular disease, unspecified: Secondary | ICD-10-CM | POA: Diagnosis not present

## 2015-02-16 DIAGNOSIS — I872 Venous insufficiency (chronic) (peripheral): Secondary | ICD-10-CM | POA: Diagnosis not present

## 2015-02-16 DIAGNOSIS — I10 Essential (primary) hypertension: Secondary | ICD-10-CM | POA: Diagnosis not present

## 2015-02-16 DIAGNOSIS — E119 Type 2 diabetes mellitus without complications: Secondary | ICD-10-CM | POA: Diagnosis not present

## 2015-02-16 DIAGNOSIS — L97212 Non-pressure chronic ulcer of right calf with fat layer exposed: Secondary | ICD-10-CM | POA: Diagnosis not present

## 2015-02-19 DIAGNOSIS — Z6839 Body mass index (BMI) 39.0-39.9, adult: Secondary | ICD-10-CM | POA: Diagnosis not present

## 2015-02-19 DIAGNOSIS — N184 Chronic kidney disease, stage 4 (severe): Secondary | ICD-10-CM | POA: Diagnosis not present

## 2015-02-19 DIAGNOSIS — I129 Hypertensive chronic kidney disease with stage 1 through stage 4 chronic kidney disease, or unspecified chronic kidney disease: Secondary | ICD-10-CM | POA: Diagnosis not present

## 2015-02-19 DIAGNOSIS — I10 Essential (primary) hypertension: Secondary | ICD-10-CM | POA: Diagnosis not present

## 2015-02-19 DIAGNOSIS — E784 Other hyperlipidemia: Secondary | ICD-10-CM | POA: Diagnosis not present

## 2015-02-19 DIAGNOSIS — E119 Type 2 diabetes mellitus without complications: Secondary | ICD-10-CM | POA: Diagnosis not present

## 2015-02-19 DIAGNOSIS — E1165 Type 2 diabetes mellitus with hyperglycemia: Secondary | ICD-10-CM | POA: Diagnosis not present

## 2015-02-19 DIAGNOSIS — R05 Cough: Secondary | ICD-10-CM | POA: Diagnosis not present

## 2015-02-20 ENCOUNTER — Encounter (HOSPITAL_BASED_OUTPATIENT_CLINIC_OR_DEPARTMENT_OTHER): Payer: Medicare Other | Attending: General Surgery

## 2015-02-20 DIAGNOSIS — M1990 Unspecified osteoarthritis, unspecified site: Secondary | ICD-10-CM | POA: Diagnosis not present

## 2015-02-20 DIAGNOSIS — I10 Essential (primary) hypertension: Secondary | ICD-10-CM | POA: Diagnosis not present

## 2015-02-20 DIAGNOSIS — I739 Peripheral vascular disease, unspecified: Secondary | ICD-10-CM | POA: Diagnosis not present

## 2015-02-20 DIAGNOSIS — L97212 Non-pressure chronic ulcer of right calf with fat layer exposed: Secondary | ICD-10-CM | POA: Diagnosis not present

## 2015-02-20 DIAGNOSIS — I872 Venous insufficiency (chronic) (peripheral): Secondary | ICD-10-CM | POA: Diagnosis not present

## 2015-02-20 DIAGNOSIS — I87312 Chronic venous hypertension (idiopathic) with ulcer of left lower extremity: Secondary | ICD-10-CM | POA: Insufficient documentation

## 2015-02-20 DIAGNOSIS — L97821 Non-pressure chronic ulcer of other part of left lower leg limited to breakdown of skin: Secondary | ICD-10-CM | POA: Diagnosis not present

## 2015-02-20 DIAGNOSIS — E119 Type 2 diabetes mellitus without complications: Secondary | ICD-10-CM | POA: Diagnosis not present

## 2015-02-20 DIAGNOSIS — M109 Gout, unspecified: Secondary | ICD-10-CM | POA: Insufficient documentation

## 2015-02-21 DIAGNOSIS — I739 Peripheral vascular disease, unspecified: Secondary | ICD-10-CM | POA: Diagnosis not present

## 2015-02-21 DIAGNOSIS — I10 Essential (primary) hypertension: Secondary | ICD-10-CM | POA: Diagnosis not present

## 2015-02-21 DIAGNOSIS — M109 Gout, unspecified: Secondary | ICD-10-CM | POA: Diagnosis not present

## 2015-02-21 DIAGNOSIS — I872 Venous insufficiency (chronic) (peripheral): Secondary | ICD-10-CM | POA: Diagnosis not present

## 2015-02-21 DIAGNOSIS — L97212 Non-pressure chronic ulcer of right calf with fat layer exposed: Secondary | ICD-10-CM | POA: Diagnosis not present

## 2015-02-21 DIAGNOSIS — E119 Type 2 diabetes mellitus without complications: Secondary | ICD-10-CM | POA: Diagnosis not present

## 2015-02-23 DIAGNOSIS — L97821 Non-pressure chronic ulcer of other part of left lower leg limited to breakdown of skin: Secondary | ICD-10-CM | POA: Diagnosis not present

## 2015-02-23 DIAGNOSIS — M109 Gout, unspecified: Secondary | ICD-10-CM | POA: Diagnosis not present

## 2015-02-23 DIAGNOSIS — I10 Essential (primary) hypertension: Secondary | ICD-10-CM | POA: Diagnosis not present

## 2015-02-23 DIAGNOSIS — E1151 Type 2 diabetes mellitus with diabetic peripheral angiopathy without gangrene: Secondary | ICD-10-CM | POA: Diagnosis not present

## 2015-02-23 DIAGNOSIS — M1990 Unspecified osteoarthritis, unspecified site: Secondary | ICD-10-CM | POA: Diagnosis not present

## 2015-02-23 DIAGNOSIS — I87312 Chronic venous hypertension (idiopathic) with ulcer of left lower extremity: Secondary | ICD-10-CM | POA: Diagnosis not present

## 2015-02-26 DIAGNOSIS — M1990 Unspecified osteoarthritis, unspecified site: Secondary | ICD-10-CM | POA: Diagnosis not present

## 2015-02-26 DIAGNOSIS — I10 Essential (primary) hypertension: Secondary | ICD-10-CM | POA: Diagnosis not present

## 2015-02-26 DIAGNOSIS — M109 Gout, unspecified: Secondary | ICD-10-CM | POA: Diagnosis not present

## 2015-02-26 DIAGNOSIS — E1151 Type 2 diabetes mellitus with diabetic peripheral angiopathy without gangrene: Secondary | ICD-10-CM | POA: Diagnosis not present

## 2015-02-26 DIAGNOSIS — L97821 Non-pressure chronic ulcer of other part of left lower leg limited to breakdown of skin: Secondary | ICD-10-CM | POA: Diagnosis not present

## 2015-02-26 DIAGNOSIS — I87312 Chronic venous hypertension (idiopathic) with ulcer of left lower extremity: Secondary | ICD-10-CM | POA: Diagnosis not present

## 2015-03-02 DIAGNOSIS — I87312 Chronic venous hypertension (idiopathic) with ulcer of left lower extremity: Secondary | ICD-10-CM | POA: Diagnosis not present

## 2015-03-02 DIAGNOSIS — M109 Gout, unspecified: Secondary | ICD-10-CM | POA: Diagnosis not present

## 2015-03-02 DIAGNOSIS — M1990 Unspecified osteoarthritis, unspecified site: Secondary | ICD-10-CM | POA: Diagnosis not present

## 2015-03-02 DIAGNOSIS — L97821 Non-pressure chronic ulcer of other part of left lower leg limited to breakdown of skin: Secondary | ICD-10-CM | POA: Diagnosis not present

## 2015-03-02 DIAGNOSIS — I10 Essential (primary) hypertension: Secondary | ICD-10-CM | POA: Diagnosis not present

## 2015-03-02 DIAGNOSIS — E1151 Type 2 diabetes mellitus with diabetic peripheral angiopathy without gangrene: Secondary | ICD-10-CM | POA: Diagnosis not present

## 2015-03-05 DIAGNOSIS — L97821 Non-pressure chronic ulcer of other part of left lower leg limited to breakdown of skin: Secondary | ICD-10-CM | POA: Diagnosis not present

## 2015-03-05 DIAGNOSIS — I87312 Chronic venous hypertension (idiopathic) with ulcer of left lower extremity: Secondary | ICD-10-CM | POA: Diagnosis not present

## 2015-03-05 DIAGNOSIS — M109 Gout, unspecified: Secondary | ICD-10-CM | POA: Diagnosis not present

## 2015-03-05 DIAGNOSIS — M1990 Unspecified osteoarthritis, unspecified site: Secondary | ICD-10-CM | POA: Diagnosis not present

## 2015-03-05 DIAGNOSIS — I10 Essential (primary) hypertension: Secondary | ICD-10-CM | POA: Diagnosis not present

## 2015-03-05 DIAGNOSIS — E1151 Type 2 diabetes mellitus with diabetic peripheral angiopathy without gangrene: Secondary | ICD-10-CM | POA: Diagnosis not present

## 2015-03-06 DIAGNOSIS — L97821 Non-pressure chronic ulcer of other part of left lower leg limited to breakdown of skin: Secondary | ICD-10-CM | POA: Diagnosis not present

## 2015-03-06 DIAGNOSIS — I10 Essential (primary) hypertension: Secondary | ICD-10-CM | POA: Diagnosis not present

## 2015-03-06 DIAGNOSIS — M109 Gout, unspecified: Secondary | ICD-10-CM | POA: Diagnosis not present

## 2015-03-06 DIAGNOSIS — I87312 Chronic venous hypertension (idiopathic) with ulcer of left lower extremity: Secondary | ICD-10-CM | POA: Diagnosis not present

## 2015-03-06 DIAGNOSIS — I739 Peripheral vascular disease, unspecified: Secondary | ICD-10-CM | POA: Diagnosis not present

## 2015-03-06 DIAGNOSIS — E119 Type 2 diabetes mellitus without complications: Secondary | ICD-10-CM | POA: Diagnosis not present

## 2015-03-07 DIAGNOSIS — M1990 Unspecified osteoarthritis, unspecified site: Secondary | ICD-10-CM | POA: Diagnosis not present

## 2015-03-07 DIAGNOSIS — I87312 Chronic venous hypertension (idiopathic) with ulcer of left lower extremity: Secondary | ICD-10-CM | POA: Diagnosis not present

## 2015-03-07 DIAGNOSIS — L97821 Non-pressure chronic ulcer of other part of left lower leg limited to breakdown of skin: Secondary | ICD-10-CM | POA: Diagnosis not present

## 2015-03-07 DIAGNOSIS — E1151 Type 2 diabetes mellitus with diabetic peripheral angiopathy without gangrene: Secondary | ICD-10-CM | POA: Diagnosis not present

## 2015-03-07 DIAGNOSIS — I10 Essential (primary) hypertension: Secondary | ICD-10-CM | POA: Diagnosis not present

## 2015-03-07 DIAGNOSIS — M109 Gout, unspecified: Secondary | ICD-10-CM | POA: Diagnosis not present

## 2015-03-09 DIAGNOSIS — L97821 Non-pressure chronic ulcer of other part of left lower leg limited to breakdown of skin: Secondary | ICD-10-CM | POA: Diagnosis not present

## 2015-03-09 DIAGNOSIS — E1151 Type 2 diabetes mellitus with diabetic peripheral angiopathy without gangrene: Secondary | ICD-10-CM | POA: Diagnosis not present

## 2015-03-09 DIAGNOSIS — I10 Essential (primary) hypertension: Secondary | ICD-10-CM | POA: Diagnosis not present

## 2015-03-09 DIAGNOSIS — M109 Gout, unspecified: Secondary | ICD-10-CM | POA: Diagnosis not present

## 2015-03-09 DIAGNOSIS — I87312 Chronic venous hypertension (idiopathic) with ulcer of left lower extremity: Secondary | ICD-10-CM | POA: Diagnosis not present

## 2015-03-09 DIAGNOSIS — M1990 Unspecified osteoarthritis, unspecified site: Secondary | ICD-10-CM | POA: Diagnosis not present

## 2015-03-12 DIAGNOSIS — M1990 Unspecified osteoarthritis, unspecified site: Secondary | ICD-10-CM | POA: Diagnosis not present

## 2015-03-12 DIAGNOSIS — E1151 Type 2 diabetes mellitus with diabetic peripheral angiopathy without gangrene: Secondary | ICD-10-CM | POA: Diagnosis not present

## 2015-03-12 DIAGNOSIS — M109 Gout, unspecified: Secondary | ICD-10-CM | POA: Diagnosis not present

## 2015-03-12 DIAGNOSIS — L97821 Non-pressure chronic ulcer of other part of left lower leg limited to breakdown of skin: Secondary | ICD-10-CM | POA: Diagnosis not present

## 2015-03-12 DIAGNOSIS — I10 Essential (primary) hypertension: Secondary | ICD-10-CM | POA: Diagnosis not present

## 2015-03-12 DIAGNOSIS — I87312 Chronic venous hypertension (idiopathic) with ulcer of left lower extremity: Secondary | ICD-10-CM | POA: Diagnosis not present

## 2015-04-18 ENCOUNTER — Ambulatory Visit (INDEPENDENT_AMBULATORY_CARE_PROVIDER_SITE_OTHER): Payer: Medicare Other | Admitting: Podiatry

## 2015-04-18 NOTE — Progress Notes (Signed)
   Subjective:    Patient ID: Jill Allen, female    DOB: 08/26/1915, 82 y.o.   MRN: [id number]  HPI No show   Review of Systems     Objective:   Physical Exam        Assessment & Plan:

## 2015-04-26 DIAGNOSIS — M109 Gout, unspecified: Secondary | ICD-10-CM | POA: Diagnosis not present

## 2015-04-26 DIAGNOSIS — E1165 Type 2 diabetes mellitus with hyperglycemia: Secondary | ICD-10-CM | POA: Diagnosis not present

## 2015-04-26 DIAGNOSIS — E784 Other hyperlipidemia: Secondary | ICD-10-CM | POA: Diagnosis not present

## 2015-05-01 DIAGNOSIS — N390 Urinary tract infection, site not specified: Secondary | ICD-10-CM | POA: Diagnosis not present

## 2015-05-01 DIAGNOSIS — N183 Chronic kidney disease, stage 3 (moderate): Secondary | ICD-10-CM | POA: Diagnosis not present

## 2015-05-01 DIAGNOSIS — Z23 Encounter for immunization: Secondary | ICD-10-CM | POA: Diagnosis not present

## 2015-05-01 DIAGNOSIS — E1165 Type 2 diabetes mellitus with hyperglycemia: Secondary | ICD-10-CM | POA: Diagnosis not present

## 2015-05-01 DIAGNOSIS — I129 Hypertensive chronic kidney disease with stage 1 through stage 4 chronic kidney disease, or unspecified chronic kidney disease: Secondary | ICD-10-CM | POA: Diagnosis not present

## 2015-05-01 DIAGNOSIS — I872 Venous insufficiency (chronic) (peripheral): Secondary | ICD-10-CM | POA: Diagnosis not present

## 2015-05-01 DIAGNOSIS — Z1389 Encounter for screening for other disorder: Secondary | ICD-10-CM | POA: Diagnosis not present

## 2015-05-01 DIAGNOSIS — E784 Other hyperlipidemia: Secondary | ICD-10-CM | POA: Diagnosis not present

## 2015-05-01 DIAGNOSIS — M109 Gout, unspecified: Secondary | ICD-10-CM | POA: Diagnosis not present

## 2015-05-01 DIAGNOSIS — I10 Essential (primary) hypertension: Secondary | ICD-10-CM | POA: Diagnosis not present

## 2015-05-01 DIAGNOSIS — R8290 Unspecified abnormal findings in urine: Secondary | ICD-10-CM | POA: Diagnosis not present

## 2015-05-01 DIAGNOSIS — Z0000 Encounter for general adult medical examination without abnormal findings: Secondary | ICD-10-CM | POA: Diagnosis not present

## 2015-08-27 DIAGNOSIS — I10 Essential (primary) hypertension: Secondary | ICD-10-CM | POA: Diagnosis not present

## 2015-08-27 DIAGNOSIS — I83009 Varicose veins of unspecified lower extremity with ulcer of unspecified site: Secondary | ICD-10-CM | POA: Diagnosis not present

## 2015-08-27 DIAGNOSIS — E784 Other hyperlipidemia: Secondary | ICD-10-CM | POA: Diagnosis not present

## 2015-08-27 DIAGNOSIS — E1165 Type 2 diabetes mellitus with hyperglycemia: Secondary | ICD-10-CM | POA: Diagnosis not present

## 2015-08-29 DIAGNOSIS — L97819 Non-pressure chronic ulcer of other part of right lower leg with unspecified severity: Secondary | ICD-10-CM | POA: Diagnosis not present

## 2015-08-29 DIAGNOSIS — E1165 Type 2 diabetes mellitus with hyperglycemia: Secondary | ICD-10-CM | POA: Diagnosis not present

## 2015-08-29 DIAGNOSIS — I872 Venous insufficiency (chronic) (peripheral): Secondary | ICD-10-CM | POA: Diagnosis not present

## 2015-08-29 DIAGNOSIS — Z7984 Long term (current) use of oral hypoglycemic drugs: Secondary | ICD-10-CM | POA: Diagnosis not present

## 2015-08-29 DIAGNOSIS — Z87891 Personal history of nicotine dependence: Secondary | ICD-10-CM | POA: Diagnosis not present

## 2015-08-29 DIAGNOSIS — I10 Essential (primary) hypertension: Secondary | ICD-10-CM | POA: Diagnosis not present

## 2015-08-31 DIAGNOSIS — E1165 Type 2 diabetes mellitus with hyperglycemia: Secondary | ICD-10-CM | POA: Diagnosis not present

## 2015-08-31 DIAGNOSIS — I872 Venous insufficiency (chronic) (peripheral): Secondary | ICD-10-CM | POA: Diagnosis not present

## 2015-08-31 DIAGNOSIS — Z87891 Personal history of nicotine dependence: Secondary | ICD-10-CM | POA: Diagnosis not present

## 2015-08-31 DIAGNOSIS — L97819 Non-pressure chronic ulcer of other part of right lower leg with unspecified severity: Secondary | ICD-10-CM | POA: Diagnosis not present

## 2015-08-31 DIAGNOSIS — I10 Essential (primary) hypertension: Secondary | ICD-10-CM | POA: Diagnosis not present

## 2015-08-31 DIAGNOSIS — Z7984 Long term (current) use of oral hypoglycemic drugs: Secondary | ICD-10-CM | POA: Diagnosis not present

## 2015-09-04 DIAGNOSIS — L97819 Non-pressure chronic ulcer of other part of right lower leg with unspecified severity: Secondary | ICD-10-CM | POA: Diagnosis not present

## 2015-09-04 DIAGNOSIS — E1165 Type 2 diabetes mellitus with hyperglycemia: Secondary | ICD-10-CM | POA: Diagnosis not present

## 2015-09-04 DIAGNOSIS — I872 Venous insufficiency (chronic) (peripheral): Secondary | ICD-10-CM | POA: Diagnosis not present

## 2015-09-04 DIAGNOSIS — Z7984 Long term (current) use of oral hypoglycemic drugs: Secondary | ICD-10-CM | POA: Diagnosis not present

## 2015-09-04 DIAGNOSIS — I10 Essential (primary) hypertension: Secondary | ICD-10-CM | POA: Diagnosis not present

## 2015-09-04 DIAGNOSIS — Z87891 Personal history of nicotine dependence: Secondary | ICD-10-CM | POA: Diagnosis not present

## 2015-09-05 DIAGNOSIS — I872 Venous insufficiency (chronic) (peripheral): Secondary | ICD-10-CM | POA: Diagnosis not present

## 2015-09-05 DIAGNOSIS — Z87891 Personal history of nicotine dependence: Secondary | ICD-10-CM | POA: Diagnosis not present

## 2015-09-05 DIAGNOSIS — E1165 Type 2 diabetes mellitus with hyperglycemia: Secondary | ICD-10-CM | POA: Diagnosis not present

## 2015-09-05 DIAGNOSIS — L97819 Non-pressure chronic ulcer of other part of right lower leg with unspecified severity: Secondary | ICD-10-CM | POA: Diagnosis not present

## 2015-09-05 DIAGNOSIS — I10 Essential (primary) hypertension: Secondary | ICD-10-CM | POA: Diagnosis not present

## 2015-09-05 DIAGNOSIS — Z7984 Long term (current) use of oral hypoglycemic drugs: Secondary | ICD-10-CM | POA: Diagnosis not present

## 2015-09-07 DIAGNOSIS — I872 Venous insufficiency (chronic) (peripheral): Secondary | ICD-10-CM | POA: Diagnosis not present

## 2015-09-07 DIAGNOSIS — L97819 Non-pressure chronic ulcer of other part of right lower leg with unspecified severity: Secondary | ICD-10-CM | POA: Diagnosis not present

## 2015-09-07 DIAGNOSIS — Z7984 Long term (current) use of oral hypoglycemic drugs: Secondary | ICD-10-CM | POA: Diagnosis not present

## 2015-09-07 DIAGNOSIS — E1165 Type 2 diabetes mellitus with hyperglycemia: Secondary | ICD-10-CM | POA: Diagnosis not present

## 2015-09-07 DIAGNOSIS — Z87891 Personal history of nicotine dependence: Secondary | ICD-10-CM | POA: Diagnosis not present

## 2015-09-07 DIAGNOSIS — I10 Essential (primary) hypertension: Secondary | ICD-10-CM | POA: Diagnosis not present

## 2015-09-11 DIAGNOSIS — I872 Venous insufficiency (chronic) (peripheral): Secondary | ICD-10-CM | POA: Diagnosis not present

## 2015-09-11 DIAGNOSIS — I10 Essential (primary) hypertension: Secondary | ICD-10-CM | POA: Diagnosis not present

## 2015-09-11 DIAGNOSIS — E1165 Type 2 diabetes mellitus with hyperglycemia: Secondary | ICD-10-CM | POA: Diagnosis not present

## 2015-09-11 DIAGNOSIS — L97819 Non-pressure chronic ulcer of other part of right lower leg with unspecified severity: Secondary | ICD-10-CM | POA: Diagnosis not present

## 2015-09-11 DIAGNOSIS — Z7984 Long term (current) use of oral hypoglycemic drugs: Secondary | ICD-10-CM | POA: Diagnosis not present

## 2015-09-11 DIAGNOSIS — Z87891 Personal history of nicotine dependence: Secondary | ICD-10-CM | POA: Diagnosis not present

## 2015-09-14 DIAGNOSIS — Z87891 Personal history of nicotine dependence: Secondary | ICD-10-CM | POA: Diagnosis not present

## 2015-09-14 DIAGNOSIS — I10 Essential (primary) hypertension: Secondary | ICD-10-CM | POA: Diagnosis not present

## 2015-09-14 DIAGNOSIS — I872 Venous insufficiency (chronic) (peripheral): Secondary | ICD-10-CM | POA: Diagnosis not present

## 2015-09-14 DIAGNOSIS — L97819 Non-pressure chronic ulcer of other part of right lower leg with unspecified severity: Secondary | ICD-10-CM | POA: Diagnosis not present

## 2015-09-14 DIAGNOSIS — E1165 Type 2 diabetes mellitus with hyperglycemia: Secondary | ICD-10-CM | POA: Diagnosis not present

## 2015-09-14 DIAGNOSIS — Z7984 Long term (current) use of oral hypoglycemic drugs: Secondary | ICD-10-CM | POA: Diagnosis not present

## 2015-09-18 DIAGNOSIS — Z87891 Personal history of nicotine dependence: Secondary | ICD-10-CM | POA: Diagnosis not present

## 2015-09-18 DIAGNOSIS — E1165 Type 2 diabetes mellitus with hyperglycemia: Secondary | ICD-10-CM | POA: Diagnosis not present

## 2015-09-18 DIAGNOSIS — Z7984 Long term (current) use of oral hypoglycemic drugs: Secondary | ICD-10-CM | POA: Diagnosis not present

## 2015-09-18 DIAGNOSIS — I10 Essential (primary) hypertension: Secondary | ICD-10-CM | POA: Diagnosis not present

## 2015-09-18 DIAGNOSIS — L97819 Non-pressure chronic ulcer of other part of right lower leg with unspecified severity: Secondary | ICD-10-CM | POA: Diagnosis not present

## 2015-09-18 DIAGNOSIS — I872 Venous insufficiency (chronic) (peripheral): Secondary | ICD-10-CM | POA: Diagnosis not present

## 2015-09-20 DIAGNOSIS — L97819 Non-pressure chronic ulcer of other part of right lower leg with unspecified severity: Secondary | ICD-10-CM | POA: Diagnosis not present

## 2015-09-20 DIAGNOSIS — I872 Venous insufficiency (chronic) (peripheral): Secondary | ICD-10-CM | POA: Diagnosis not present

## 2015-09-20 DIAGNOSIS — I10 Essential (primary) hypertension: Secondary | ICD-10-CM | POA: Diagnosis not present

## 2015-09-20 DIAGNOSIS — E1165 Type 2 diabetes mellitus with hyperglycemia: Secondary | ICD-10-CM | POA: Diagnosis not present

## 2015-09-20 DIAGNOSIS — Z87891 Personal history of nicotine dependence: Secondary | ICD-10-CM | POA: Diagnosis not present

## 2015-09-20 DIAGNOSIS — Z7984 Long term (current) use of oral hypoglycemic drugs: Secondary | ICD-10-CM | POA: Diagnosis not present

## 2015-09-21 DIAGNOSIS — I10 Essential (primary) hypertension: Secondary | ICD-10-CM | POA: Diagnosis not present

## 2015-09-21 DIAGNOSIS — I872 Venous insufficiency (chronic) (peripheral): Secondary | ICD-10-CM | POA: Diagnosis not present

## 2015-09-21 DIAGNOSIS — L97819 Non-pressure chronic ulcer of other part of right lower leg with unspecified severity: Secondary | ICD-10-CM | POA: Diagnosis not present

## 2015-09-21 DIAGNOSIS — Z87891 Personal history of nicotine dependence: Secondary | ICD-10-CM | POA: Diagnosis not present

## 2015-09-21 DIAGNOSIS — E1165 Type 2 diabetes mellitus with hyperglycemia: Secondary | ICD-10-CM | POA: Diagnosis not present

## 2015-09-21 DIAGNOSIS — Z7984 Long term (current) use of oral hypoglycemic drugs: Secondary | ICD-10-CM | POA: Diagnosis not present

## 2015-10-02 ENCOUNTER — Ambulatory Visit (INDEPENDENT_AMBULATORY_CARE_PROVIDER_SITE_OTHER): Payer: Medicare Other | Admitting: Podiatry

## 2015-10-02 ENCOUNTER — Encounter: Payer: Self-pay | Admitting: Podiatry

## 2015-10-02 DIAGNOSIS — B351 Tinea unguium: Secondary | ICD-10-CM | POA: Diagnosis not present

## 2015-10-02 DIAGNOSIS — M79676 Pain in unspecified toe(s): Secondary | ICD-10-CM | POA: Diagnosis not present

## 2015-10-02 NOTE — Progress Notes (Signed)
Patient ID: Jill Allen, female   DOB: [birth year], 82 y.o.   MRN: [id number]  Subjective: This patient presents with his niece present in the treatment room complaining of thickened and elongated toenails which or cough walking wearing shoes and requests toenail debridement  Objective: Patient transfers from wheelchair to treatment chair No open skin lesions bilaterally Atrophic skin bilaterally The toenails are brittle, elongated, discolored, deformed and tender to palpation 6-10 Hammertoe deformities 2-4 bilaterally DP and PT pulses nonpalpable bilaterally  Assessment: Symptomatic onychomycoses 6-10 Decrease pedal pulses bilaterally with a known history of peripheral arterial disease  Plan: Debridement of toenails 6-10 mechanically and electrically without any bleeding  Reappoint 3 months

## 2015-12-04 DIAGNOSIS — E1165 Type 2 diabetes mellitus with hyperglycemia: Secondary | ICD-10-CM | POA: Diagnosis not present

## 2015-12-04 DIAGNOSIS — I83009 Varicose veins of unspecified lower extremity with ulcer of unspecified site: Secondary | ICD-10-CM | POA: Diagnosis not present

## 2015-12-04 DIAGNOSIS — I10 Essential (primary) hypertension: Secondary | ICD-10-CM | POA: Diagnosis not present

## 2015-12-04 DIAGNOSIS — E784 Other hyperlipidemia: Secondary | ICD-10-CM | POA: Diagnosis not present

## 2015-12-07 DIAGNOSIS — L97819 Non-pressure chronic ulcer of other part of right lower leg with unspecified severity: Secondary | ICD-10-CM | POA: Diagnosis not present

## 2015-12-07 DIAGNOSIS — I872 Venous insufficiency (chronic) (peripheral): Secondary | ICD-10-CM | POA: Diagnosis not present

## 2015-12-07 DIAGNOSIS — L97829 Non-pressure chronic ulcer of other part of left lower leg with unspecified severity: Secondary | ICD-10-CM | POA: Diagnosis not present

## 2015-12-07 DIAGNOSIS — I10 Essential (primary) hypertension: Secondary | ICD-10-CM | POA: Diagnosis not present

## 2015-12-07 DIAGNOSIS — E11649 Type 2 diabetes mellitus with hypoglycemia without coma: Secondary | ICD-10-CM | POA: Diagnosis not present

## 2015-12-07 DIAGNOSIS — Z87891 Personal history of nicotine dependence: Secondary | ICD-10-CM | POA: Diagnosis not present

## 2015-12-10 DIAGNOSIS — Z87891 Personal history of nicotine dependence: Secondary | ICD-10-CM | POA: Diagnosis not present

## 2015-12-10 DIAGNOSIS — I10 Essential (primary) hypertension: Secondary | ICD-10-CM | POA: Diagnosis not present

## 2015-12-10 DIAGNOSIS — L97819 Non-pressure chronic ulcer of other part of right lower leg with unspecified severity: Secondary | ICD-10-CM | POA: Diagnosis not present

## 2015-12-10 DIAGNOSIS — L97829 Non-pressure chronic ulcer of other part of left lower leg with unspecified severity: Secondary | ICD-10-CM | POA: Diagnosis not present

## 2015-12-10 DIAGNOSIS — I872 Venous insufficiency (chronic) (peripheral): Secondary | ICD-10-CM | POA: Diagnosis not present

## 2015-12-10 DIAGNOSIS — E11649 Type 2 diabetes mellitus with hypoglycemia without coma: Secondary | ICD-10-CM | POA: Diagnosis not present

## 2015-12-12 DIAGNOSIS — E11649 Type 2 diabetes mellitus with hypoglycemia without coma: Secondary | ICD-10-CM | POA: Diagnosis not present

## 2015-12-12 DIAGNOSIS — L97819 Non-pressure chronic ulcer of other part of right lower leg with unspecified severity: Secondary | ICD-10-CM | POA: Diagnosis not present

## 2015-12-12 DIAGNOSIS — I10 Essential (primary) hypertension: Secondary | ICD-10-CM | POA: Diagnosis not present

## 2015-12-12 DIAGNOSIS — L97829 Non-pressure chronic ulcer of other part of left lower leg with unspecified severity: Secondary | ICD-10-CM | POA: Diagnosis not present

## 2015-12-12 DIAGNOSIS — I872 Venous insufficiency (chronic) (peripheral): Secondary | ICD-10-CM | POA: Diagnosis not present

## 2015-12-12 DIAGNOSIS — Z87891 Personal history of nicotine dependence: Secondary | ICD-10-CM | POA: Diagnosis not present

## 2015-12-14 DIAGNOSIS — L97829 Non-pressure chronic ulcer of other part of left lower leg with unspecified severity: Secondary | ICD-10-CM | POA: Diagnosis not present

## 2015-12-14 DIAGNOSIS — E11649 Type 2 diabetes mellitus with hypoglycemia without coma: Secondary | ICD-10-CM | POA: Diagnosis not present

## 2015-12-14 DIAGNOSIS — I10 Essential (primary) hypertension: Secondary | ICD-10-CM | POA: Diagnosis not present

## 2015-12-14 DIAGNOSIS — I872 Venous insufficiency (chronic) (peripheral): Secondary | ICD-10-CM | POA: Diagnosis not present

## 2015-12-14 DIAGNOSIS — L97819 Non-pressure chronic ulcer of other part of right lower leg with unspecified severity: Secondary | ICD-10-CM | POA: Diagnosis not present

## 2015-12-14 DIAGNOSIS — Z87891 Personal history of nicotine dependence: Secondary | ICD-10-CM | POA: Diagnosis not present

## 2015-12-19 DIAGNOSIS — I872 Venous insufficiency (chronic) (peripheral): Secondary | ICD-10-CM | POA: Diagnosis not present

## 2015-12-19 DIAGNOSIS — Z87891 Personal history of nicotine dependence: Secondary | ICD-10-CM | POA: Diagnosis not present

## 2015-12-19 DIAGNOSIS — E11649 Type 2 diabetes mellitus with hypoglycemia without coma: Secondary | ICD-10-CM | POA: Diagnosis not present

## 2015-12-19 DIAGNOSIS — I10 Essential (primary) hypertension: Secondary | ICD-10-CM | POA: Diagnosis not present

## 2015-12-19 DIAGNOSIS — L97819 Non-pressure chronic ulcer of other part of right lower leg with unspecified severity: Secondary | ICD-10-CM | POA: Diagnosis not present

## 2015-12-19 DIAGNOSIS — L97829 Non-pressure chronic ulcer of other part of left lower leg with unspecified severity: Secondary | ICD-10-CM | POA: Diagnosis not present

## 2015-12-21 DIAGNOSIS — I10 Essential (primary) hypertension: Secondary | ICD-10-CM | POA: Diagnosis not present

## 2015-12-21 DIAGNOSIS — E11649 Type 2 diabetes mellitus with hypoglycemia without coma: Secondary | ICD-10-CM | POA: Diagnosis not present

## 2015-12-21 DIAGNOSIS — I872 Venous insufficiency (chronic) (peripheral): Secondary | ICD-10-CM | POA: Diagnosis not present

## 2015-12-21 DIAGNOSIS — L97819 Non-pressure chronic ulcer of other part of right lower leg with unspecified severity: Secondary | ICD-10-CM | POA: Diagnosis not present

## 2015-12-21 DIAGNOSIS — Z87891 Personal history of nicotine dependence: Secondary | ICD-10-CM | POA: Diagnosis not present

## 2015-12-21 DIAGNOSIS — L97829 Non-pressure chronic ulcer of other part of left lower leg with unspecified severity: Secondary | ICD-10-CM | POA: Diagnosis not present

## 2015-12-24 DIAGNOSIS — I10 Essential (primary) hypertension: Secondary | ICD-10-CM | POA: Diagnosis not present

## 2015-12-24 DIAGNOSIS — L97829 Non-pressure chronic ulcer of other part of left lower leg with unspecified severity: Secondary | ICD-10-CM | POA: Diagnosis not present

## 2015-12-24 DIAGNOSIS — I872 Venous insufficiency (chronic) (peripheral): Secondary | ICD-10-CM | POA: Diagnosis not present

## 2015-12-24 DIAGNOSIS — E11649 Type 2 diabetes mellitus with hypoglycemia without coma: Secondary | ICD-10-CM | POA: Diagnosis not present

## 2015-12-24 DIAGNOSIS — Z87891 Personal history of nicotine dependence: Secondary | ICD-10-CM | POA: Diagnosis not present

## 2015-12-24 DIAGNOSIS — L97819 Non-pressure chronic ulcer of other part of right lower leg with unspecified severity: Secondary | ICD-10-CM | POA: Diagnosis not present

## 2015-12-26 DIAGNOSIS — I872 Venous insufficiency (chronic) (peripheral): Secondary | ICD-10-CM | POA: Diagnosis not present

## 2015-12-26 DIAGNOSIS — L97819 Non-pressure chronic ulcer of other part of right lower leg with unspecified severity: Secondary | ICD-10-CM | POA: Diagnosis not present

## 2015-12-26 DIAGNOSIS — Z87891 Personal history of nicotine dependence: Secondary | ICD-10-CM | POA: Diagnosis not present

## 2015-12-26 DIAGNOSIS — L97829 Non-pressure chronic ulcer of other part of left lower leg with unspecified severity: Secondary | ICD-10-CM | POA: Diagnosis not present

## 2015-12-26 DIAGNOSIS — E11649 Type 2 diabetes mellitus with hypoglycemia without coma: Secondary | ICD-10-CM | POA: Diagnosis not present

## 2015-12-26 DIAGNOSIS — I10 Essential (primary) hypertension: Secondary | ICD-10-CM | POA: Diagnosis not present

## 2015-12-28 DIAGNOSIS — I10 Essential (primary) hypertension: Secondary | ICD-10-CM | POA: Diagnosis not present

## 2015-12-28 DIAGNOSIS — L97829 Non-pressure chronic ulcer of other part of left lower leg with unspecified severity: Secondary | ICD-10-CM | POA: Diagnosis not present

## 2015-12-28 DIAGNOSIS — L97819 Non-pressure chronic ulcer of other part of right lower leg with unspecified severity: Secondary | ICD-10-CM | POA: Diagnosis not present

## 2015-12-28 DIAGNOSIS — Z87891 Personal history of nicotine dependence: Secondary | ICD-10-CM | POA: Diagnosis not present

## 2015-12-28 DIAGNOSIS — E11649 Type 2 diabetes mellitus with hypoglycemia without coma: Secondary | ICD-10-CM | POA: Diagnosis not present

## 2015-12-28 DIAGNOSIS — I872 Venous insufficiency (chronic) (peripheral): Secondary | ICD-10-CM | POA: Diagnosis not present

## 2015-12-31 DIAGNOSIS — I10 Essential (primary) hypertension: Secondary | ICD-10-CM | POA: Diagnosis not present

## 2015-12-31 DIAGNOSIS — E11649 Type 2 diabetes mellitus with hypoglycemia without coma: Secondary | ICD-10-CM | POA: Diagnosis not present

## 2015-12-31 DIAGNOSIS — Z87891 Personal history of nicotine dependence: Secondary | ICD-10-CM | POA: Diagnosis not present

## 2015-12-31 DIAGNOSIS — L97819 Non-pressure chronic ulcer of other part of right lower leg with unspecified severity: Secondary | ICD-10-CM | POA: Diagnosis not present

## 2015-12-31 DIAGNOSIS — L97829 Non-pressure chronic ulcer of other part of left lower leg with unspecified severity: Secondary | ICD-10-CM | POA: Diagnosis not present

## 2015-12-31 DIAGNOSIS — I872 Venous insufficiency (chronic) (peripheral): Secondary | ICD-10-CM | POA: Diagnosis not present

## 2016-01-02 DIAGNOSIS — I10 Essential (primary) hypertension: Secondary | ICD-10-CM | POA: Diagnosis not present

## 2016-01-02 DIAGNOSIS — L97819 Non-pressure chronic ulcer of other part of right lower leg with unspecified severity: Secondary | ICD-10-CM | POA: Diagnosis not present

## 2016-01-02 DIAGNOSIS — E11649 Type 2 diabetes mellitus with hypoglycemia without coma: Secondary | ICD-10-CM | POA: Diagnosis not present

## 2016-01-02 DIAGNOSIS — L97829 Non-pressure chronic ulcer of other part of left lower leg with unspecified severity: Secondary | ICD-10-CM | POA: Diagnosis not present

## 2016-01-02 DIAGNOSIS — Z87891 Personal history of nicotine dependence: Secondary | ICD-10-CM | POA: Diagnosis not present

## 2016-01-02 DIAGNOSIS — I872 Venous insufficiency (chronic) (peripheral): Secondary | ICD-10-CM | POA: Diagnosis not present

## 2016-01-04 DIAGNOSIS — Z87891 Personal history of nicotine dependence: Secondary | ICD-10-CM | POA: Diagnosis not present

## 2016-01-04 DIAGNOSIS — L97819 Non-pressure chronic ulcer of other part of right lower leg with unspecified severity: Secondary | ICD-10-CM | POA: Diagnosis not present

## 2016-01-04 DIAGNOSIS — L97829 Non-pressure chronic ulcer of other part of left lower leg with unspecified severity: Secondary | ICD-10-CM | POA: Diagnosis not present

## 2016-01-04 DIAGNOSIS — I872 Venous insufficiency (chronic) (peripheral): Secondary | ICD-10-CM | POA: Diagnosis not present

## 2016-01-04 DIAGNOSIS — I10 Essential (primary) hypertension: Secondary | ICD-10-CM | POA: Diagnosis not present

## 2016-01-04 DIAGNOSIS — E11649 Type 2 diabetes mellitus with hypoglycemia without coma: Secondary | ICD-10-CM | POA: Diagnosis not present

## 2016-01-07 DIAGNOSIS — E11649 Type 2 diabetes mellitus with hypoglycemia without coma: Secondary | ICD-10-CM | POA: Diagnosis not present

## 2016-01-07 DIAGNOSIS — L97829 Non-pressure chronic ulcer of other part of left lower leg with unspecified severity: Secondary | ICD-10-CM | POA: Diagnosis not present

## 2016-01-07 DIAGNOSIS — I872 Venous insufficiency (chronic) (peripheral): Secondary | ICD-10-CM | POA: Diagnosis not present

## 2016-01-07 DIAGNOSIS — Z87891 Personal history of nicotine dependence: Secondary | ICD-10-CM | POA: Diagnosis not present

## 2016-01-07 DIAGNOSIS — L97819 Non-pressure chronic ulcer of other part of right lower leg with unspecified severity: Secondary | ICD-10-CM | POA: Diagnosis not present

## 2016-01-07 DIAGNOSIS — I10 Essential (primary) hypertension: Secondary | ICD-10-CM | POA: Diagnosis not present

## 2016-01-08 ENCOUNTER — Ambulatory Visit (INDEPENDENT_AMBULATORY_CARE_PROVIDER_SITE_OTHER): Payer: Medicare Other | Admitting: Podiatry

## 2016-01-08 ENCOUNTER — Encounter: Payer: Self-pay | Admitting: Podiatry

## 2016-01-08 VITALS — BP 169/92 | HR 85 | Resp 16

## 2016-01-08 DIAGNOSIS — B351 Tinea unguium: Secondary | ICD-10-CM

## 2016-01-08 DIAGNOSIS — M79676 Pain in unspecified toe(s): Secondary | ICD-10-CM | POA: Diagnosis not present

## 2016-01-08 NOTE — Progress Notes (Signed)
Patient ID: Jill Allen, female   DOB: Aug 30, 1915, 82 y.o.   MRN: [id number]    Subjective: This patient presents with his niece present in the treatment room complaining of thickened and elongated toenails which are uncomfortable walking wearing shoes and requests toenail debridement. Also, patient recalls dog stepping on right big toe 1+ weeks ago. Patient said no self treatment Patient also under care for wound care with a Unna boot supplied you lower extremities bilaterally every 2-3 days  Objective: Patient transfers from wheelchair to treatment chair Wear the boots lower extremities bilaterally Right hallux nail sloughed with dried blood and slight maceration on nailbed. There is no surrounding erythema edema Abrasion 10 mm dorsal third left toe without any surrounding .erythema .edema, warmth No open skin lesions bilaterally Atrophic skin bilaterally The toenails are brittle, elongated, discolored, deformed and tender to palpation 6-10 Hammertoe deformities 2-4 bilaterally DP and PT pulses nonpalpable bilaterally  Assessment: Traumatic nail avulsion right hallux Abrasion third left toe Symptomatic onychomycoses 6-10 Decrease pedal pulses bilaterally with a known history of peripheral arterial disease  Plan: Debridement of toenails 6-10 mechanically and electrically without any bleeding Instructed patient apply triple antibiotic ointment to the right great toe and third left toe daily and cover with Band-Aids until healed  Reappoint 3 months

## 2016-01-08 NOTE — Patient Instructions (Signed)
Apply triple antibiotic ointment to the right big toenail daily and third left toe, cover with Band-Aids until a scab forms

## 2016-01-09 DIAGNOSIS — Z87891 Personal history of nicotine dependence: Secondary | ICD-10-CM | POA: Diagnosis not present

## 2016-01-09 DIAGNOSIS — L97829 Non-pressure chronic ulcer of other part of left lower leg with unspecified severity: Secondary | ICD-10-CM | POA: Diagnosis not present

## 2016-01-09 DIAGNOSIS — L97819 Non-pressure chronic ulcer of other part of right lower leg with unspecified severity: Secondary | ICD-10-CM | POA: Diagnosis not present

## 2016-01-09 DIAGNOSIS — E11649 Type 2 diabetes mellitus with hypoglycemia without coma: Secondary | ICD-10-CM | POA: Diagnosis not present

## 2016-01-09 DIAGNOSIS — I10 Essential (primary) hypertension: Secondary | ICD-10-CM | POA: Diagnosis not present

## 2016-01-09 DIAGNOSIS — I872 Venous insufficiency (chronic) (peripheral): Secondary | ICD-10-CM | POA: Diagnosis not present

## 2016-01-11 DIAGNOSIS — Z87891 Personal history of nicotine dependence: Secondary | ICD-10-CM | POA: Diagnosis not present

## 2016-01-11 DIAGNOSIS — L97819 Non-pressure chronic ulcer of other part of right lower leg with unspecified severity: Secondary | ICD-10-CM | POA: Diagnosis not present

## 2016-01-11 DIAGNOSIS — E11649 Type 2 diabetes mellitus with hypoglycemia without coma: Secondary | ICD-10-CM | POA: Diagnosis not present

## 2016-01-11 DIAGNOSIS — L97829 Non-pressure chronic ulcer of other part of left lower leg with unspecified severity: Secondary | ICD-10-CM | POA: Diagnosis not present

## 2016-01-11 DIAGNOSIS — I872 Venous insufficiency (chronic) (peripheral): Secondary | ICD-10-CM | POA: Diagnosis not present

## 2016-01-11 DIAGNOSIS — I10 Essential (primary) hypertension: Secondary | ICD-10-CM | POA: Diagnosis not present

## 2016-01-14 DIAGNOSIS — Z87891 Personal history of nicotine dependence: Secondary | ICD-10-CM | POA: Diagnosis not present

## 2016-01-14 DIAGNOSIS — I872 Venous insufficiency (chronic) (peripheral): Secondary | ICD-10-CM | POA: Diagnosis not present

## 2016-01-14 DIAGNOSIS — L97819 Non-pressure chronic ulcer of other part of right lower leg with unspecified severity: Secondary | ICD-10-CM | POA: Diagnosis not present

## 2016-01-14 DIAGNOSIS — E11649 Type 2 diabetes mellitus with hypoglycemia without coma: Secondary | ICD-10-CM | POA: Diagnosis not present

## 2016-01-14 DIAGNOSIS — L97829 Non-pressure chronic ulcer of other part of left lower leg with unspecified severity: Secondary | ICD-10-CM | POA: Diagnosis not present

## 2016-01-14 DIAGNOSIS — I10 Essential (primary) hypertension: Secondary | ICD-10-CM | POA: Diagnosis not present

## 2016-01-16 DIAGNOSIS — I872 Venous insufficiency (chronic) (peripheral): Secondary | ICD-10-CM | POA: Diagnosis not present

## 2016-01-16 DIAGNOSIS — Z87891 Personal history of nicotine dependence: Secondary | ICD-10-CM | POA: Diagnosis not present

## 2016-01-16 DIAGNOSIS — L97829 Non-pressure chronic ulcer of other part of left lower leg with unspecified severity: Secondary | ICD-10-CM | POA: Diagnosis not present

## 2016-01-16 DIAGNOSIS — L97819 Non-pressure chronic ulcer of other part of right lower leg with unspecified severity: Secondary | ICD-10-CM | POA: Diagnosis not present

## 2016-01-16 DIAGNOSIS — I10 Essential (primary) hypertension: Secondary | ICD-10-CM | POA: Diagnosis not present

## 2016-01-16 DIAGNOSIS — E11649 Type 2 diabetes mellitus with hypoglycemia without coma: Secondary | ICD-10-CM | POA: Diagnosis not present

## 2016-01-18 DIAGNOSIS — E11649 Type 2 diabetes mellitus with hypoglycemia without coma: Secondary | ICD-10-CM | POA: Diagnosis not present

## 2016-01-18 DIAGNOSIS — I872 Venous insufficiency (chronic) (peripheral): Secondary | ICD-10-CM | POA: Diagnosis not present

## 2016-01-18 DIAGNOSIS — L97819 Non-pressure chronic ulcer of other part of right lower leg with unspecified severity: Secondary | ICD-10-CM | POA: Diagnosis not present

## 2016-01-18 DIAGNOSIS — I10 Essential (primary) hypertension: Secondary | ICD-10-CM | POA: Diagnosis not present

## 2016-01-18 DIAGNOSIS — L97829 Non-pressure chronic ulcer of other part of left lower leg with unspecified severity: Secondary | ICD-10-CM | POA: Diagnosis not present

## 2016-01-18 DIAGNOSIS — Z87891 Personal history of nicotine dependence: Secondary | ICD-10-CM | POA: Diagnosis not present

## 2016-01-21 DIAGNOSIS — L97819 Non-pressure chronic ulcer of other part of right lower leg with unspecified severity: Secondary | ICD-10-CM | POA: Diagnosis not present

## 2016-01-21 DIAGNOSIS — I872 Venous insufficiency (chronic) (peripheral): Secondary | ICD-10-CM | POA: Diagnosis not present

## 2016-01-21 DIAGNOSIS — E11649 Type 2 diabetes mellitus with hypoglycemia without coma: Secondary | ICD-10-CM | POA: Diagnosis not present

## 2016-01-21 DIAGNOSIS — I10 Essential (primary) hypertension: Secondary | ICD-10-CM | POA: Diagnosis not present

## 2016-01-21 DIAGNOSIS — L97829 Non-pressure chronic ulcer of other part of left lower leg with unspecified severity: Secondary | ICD-10-CM | POA: Diagnosis not present

## 2016-01-21 DIAGNOSIS — Z87891 Personal history of nicotine dependence: Secondary | ICD-10-CM | POA: Diagnosis not present

## 2016-01-23 DIAGNOSIS — E11649 Type 2 diabetes mellitus with hypoglycemia without coma: Secondary | ICD-10-CM | POA: Diagnosis not present

## 2016-01-23 DIAGNOSIS — I872 Venous insufficiency (chronic) (peripheral): Secondary | ICD-10-CM | POA: Diagnosis not present

## 2016-01-23 DIAGNOSIS — L97819 Non-pressure chronic ulcer of other part of right lower leg with unspecified severity: Secondary | ICD-10-CM | POA: Diagnosis not present

## 2016-01-23 DIAGNOSIS — Z87891 Personal history of nicotine dependence: Secondary | ICD-10-CM | POA: Diagnosis not present

## 2016-01-23 DIAGNOSIS — I10 Essential (primary) hypertension: Secondary | ICD-10-CM | POA: Diagnosis not present

## 2016-01-23 DIAGNOSIS — L97829 Non-pressure chronic ulcer of other part of left lower leg with unspecified severity: Secondary | ICD-10-CM | POA: Diagnosis not present

## 2016-01-25 DIAGNOSIS — I10 Essential (primary) hypertension: Secondary | ICD-10-CM | POA: Diagnosis not present

## 2016-01-25 DIAGNOSIS — L97829 Non-pressure chronic ulcer of other part of left lower leg with unspecified severity: Secondary | ICD-10-CM | POA: Diagnosis not present

## 2016-01-25 DIAGNOSIS — Z87891 Personal history of nicotine dependence: Secondary | ICD-10-CM | POA: Diagnosis not present

## 2016-01-25 DIAGNOSIS — L97819 Non-pressure chronic ulcer of other part of right lower leg with unspecified severity: Secondary | ICD-10-CM | POA: Diagnosis not present

## 2016-01-25 DIAGNOSIS — E11649 Type 2 diabetes mellitus with hypoglycemia without coma: Secondary | ICD-10-CM | POA: Diagnosis not present

## 2016-01-25 DIAGNOSIS — I872 Venous insufficiency (chronic) (peripheral): Secondary | ICD-10-CM | POA: Diagnosis not present

## 2016-01-28 DIAGNOSIS — L97829 Non-pressure chronic ulcer of other part of left lower leg with unspecified severity: Secondary | ICD-10-CM | POA: Diagnosis not present

## 2016-01-28 DIAGNOSIS — E11649 Type 2 diabetes mellitus with hypoglycemia without coma: Secondary | ICD-10-CM | POA: Diagnosis not present

## 2016-01-28 DIAGNOSIS — L97819 Non-pressure chronic ulcer of other part of right lower leg with unspecified severity: Secondary | ICD-10-CM | POA: Diagnosis not present

## 2016-01-28 DIAGNOSIS — I872 Venous insufficiency (chronic) (peripheral): Secondary | ICD-10-CM | POA: Diagnosis not present

## 2016-01-28 DIAGNOSIS — Z87891 Personal history of nicotine dependence: Secondary | ICD-10-CM | POA: Diagnosis not present

## 2016-01-28 DIAGNOSIS — I10 Essential (primary) hypertension: Secondary | ICD-10-CM | POA: Diagnosis not present

## 2016-01-30 DIAGNOSIS — I10 Essential (primary) hypertension: Secondary | ICD-10-CM | POA: Diagnosis not present

## 2016-01-30 DIAGNOSIS — L97819 Non-pressure chronic ulcer of other part of right lower leg with unspecified severity: Secondary | ICD-10-CM | POA: Diagnosis not present

## 2016-01-30 DIAGNOSIS — I872 Venous insufficiency (chronic) (peripheral): Secondary | ICD-10-CM | POA: Diagnosis not present

## 2016-01-30 DIAGNOSIS — L97829 Non-pressure chronic ulcer of other part of left lower leg with unspecified severity: Secondary | ICD-10-CM | POA: Diagnosis not present

## 2016-01-30 DIAGNOSIS — Z87891 Personal history of nicotine dependence: Secondary | ICD-10-CM | POA: Diagnosis not present

## 2016-01-30 DIAGNOSIS — E11649 Type 2 diabetes mellitus with hypoglycemia without coma: Secondary | ICD-10-CM | POA: Diagnosis not present

## 2016-01-31 DIAGNOSIS — E11649 Type 2 diabetes mellitus with hypoglycemia without coma: Secondary | ICD-10-CM | POA: Diagnosis not present

## 2016-01-31 DIAGNOSIS — I872 Venous insufficiency (chronic) (peripheral): Secondary | ICD-10-CM | POA: Diagnosis not present

## 2016-01-31 DIAGNOSIS — L97819 Non-pressure chronic ulcer of other part of right lower leg with unspecified severity: Secondary | ICD-10-CM | POA: Diagnosis not present

## 2016-01-31 DIAGNOSIS — I10 Essential (primary) hypertension: Secondary | ICD-10-CM | POA: Diagnosis not present

## 2016-01-31 DIAGNOSIS — Z87891 Personal history of nicotine dependence: Secondary | ICD-10-CM | POA: Diagnosis not present

## 2016-01-31 DIAGNOSIS — L97829 Non-pressure chronic ulcer of other part of left lower leg with unspecified severity: Secondary | ICD-10-CM | POA: Diagnosis not present

## 2016-02-04 DIAGNOSIS — Z87891 Personal history of nicotine dependence: Secondary | ICD-10-CM | POA: Diagnosis not present

## 2016-02-04 DIAGNOSIS — L97819 Non-pressure chronic ulcer of other part of right lower leg with unspecified severity: Secondary | ICD-10-CM | POA: Diagnosis not present

## 2016-02-04 DIAGNOSIS — E11649 Type 2 diabetes mellitus with hypoglycemia without coma: Secondary | ICD-10-CM | POA: Diagnosis not present

## 2016-02-04 DIAGNOSIS — I10 Essential (primary) hypertension: Secondary | ICD-10-CM | POA: Diagnosis not present

## 2016-02-04 DIAGNOSIS — I872 Venous insufficiency (chronic) (peripheral): Secondary | ICD-10-CM | POA: Diagnosis not present

## 2016-02-04 DIAGNOSIS — L97829 Non-pressure chronic ulcer of other part of left lower leg with unspecified severity: Secondary | ICD-10-CM | POA: Diagnosis not present

## 2016-02-05 DIAGNOSIS — Z4800 Encounter for change or removal of nonsurgical wound dressing: Secondary | ICD-10-CM | POA: Diagnosis not present

## 2016-02-05 DIAGNOSIS — L97219 Non-pressure chronic ulcer of right calf with unspecified severity: Secondary | ICD-10-CM | POA: Diagnosis not present

## 2016-02-05 DIAGNOSIS — I872 Venous insufficiency (chronic) (peripheral): Secondary | ICD-10-CM | POA: Diagnosis not present

## 2016-02-05 DIAGNOSIS — I10 Essential (primary) hypertension: Secondary | ICD-10-CM | POA: Diagnosis not present

## 2016-02-05 DIAGNOSIS — L97229 Non-pressure chronic ulcer of left calf with unspecified severity: Secondary | ICD-10-CM | POA: Diagnosis not present

## 2016-02-05 DIAGNOSIS — E11649 Type 2 diabetes mellitus with hypoglycemia without coma: Secondary | ICD-10-CM | POA: Diagnosis not present

## 2016-02-06 ENCOUNTER — Encounter (HOSPITAL_COMMUNITY): Payer: Self-pay

## 2016-02-06 ENCOUNTER — Inpatient Hospital Stay (HOSPITAL_COMMUNITY)
Admission: EM | Admit: 2016-02-06 | Discharge: 2016-02-09 | DRG: 758 | Disposition: A | Discharge status: Home Health | Payer: Medicare Other | Attending: Internal Medicine | Admitting: Internal Medicine

## 2016-02-06 ENCOUNTER — Emergency Department (HOSPITAL_COMMUNITY): Payer: Medicare Other

## 2016-02-06 DIAGNOSIS — N183 Chronic kidney disease, stage 3 unspecified: Secondary | ICD-10-CM | POA: Diagnosis present

## 2016-02-06 DIAGNOSIS — L97929 Non-pressure chronic ulcer of unspecified part of left lower leg with unspecified severity: Secondary | ICD-10-CM | POA: Diagnosis present

## 2016-02-06 DIAGNOSIS — Z833 Family history of diabetes mellitus: Secondary | ICD-10-CM

## 2016-02-06 DIAGNOSIS — E1151 Type 2 diabetes mellitus with diabetic peripheral angiopathy without gangrene: Secondary | ICD-10-CM | POA: Diagnosis not present

## 2016-02-06 DIAGNOSIS — N179 Acute kidney failure, unspecified: Secondary | ICD-10-CM | POA: Diagnosis not present

## 2016-02-06 DIAGNOSIS — R7989 Other specified abnormal findings of blood chemistry: Secondary | ICD-10-CM | POA: Diagnosis not present

## 2016-02-06 DIAGNOSIS — R05 Cough: Secondary | ICD-10-CM | POA: Diagnosis not present

## 2016-02-06 DIAGNOSIS — Z7982 Long term (current) use of aspirin: Secondary | ICD-10-CM

## 2016-02-06 DIAGNOSIS — R531 Weakness: Secondary | ICD-10-CM | POA: Diagnosis not present

## 2016-02-06 DIAGNOSIS — Z87891 Personal history of nicotine dependence: Secondary | ICD-10-CM

## 2016-02-06 DIAGNOSIS — Z993 Dependence on wheelchair: Secondary | ICD-10-CM

## 2016-02-06 DIAGNOSIS — Z8249 Family history of ischemic heart disease and other diseases of the circulatory system: Secondary | ICD-10-CM

## 2016-02-06 DIAGNOSIS — Z66 Do not resuscitate: Secondary | ICD-10-CM | POA: Diagnosis not present

## 2016-02-06 DIAGNOSIS — I878 Other specified disorders of veins: Secondary | ICD-10-CM | POA: Diagnosis present

## 2016-02-06 DIAGNOSIS — N189 Chronic kidney disease, unspecified: Secondary | ICD-10-CM

## 2016-02-06 DIAGNOSIS — N390 Urinary tract infection, site not specified: Secondary | ICD-10-CM | POA: Diagnosis not present

## 2016-02-06 DIAGNOSIS — E1122 Type 2 diabetes mellitus with diabetic chronic kidney disease: Secondary | ICD-10-CM | POA: Diagnosis present

## 2016-02-06 DIAGNOSIS — E860 Dehydration: Secondary | ICD-10-CM | POA: Diagnosis not present

## 2016-02-06 DIAGNOSIS — E11622 Type 2 diabetes mellitus with other skin ulcer: Secondary | ICD-10-CM | POA: Diagnosis present

## 2016-02-06 DIAGNOSIS — I129 Hypertensive chronic kidney disease with stage 1 through stage 4 chronic kidney disease, or unspecified chronic kidney disease: Secondary | ICD-10-CM | POA: Diagnosis present

## 2016-02-06 DIAGNOSIS — B3749 Other urogenital candidiasis: Principal | ICD-10-CM | POA: Diagnosis present

## 2016-02-06 DIAGNOSIS — R5381 Other malaise: Secondary | ICD-10-CM

## 2016-02-06 DIAGNOSIS — M109 Gout, unspecified: Secondary | ICD-10-CM | POA: Diagnosis present

## 2016-02-06 DIAGNOSIS — Z79899 Other long term (current) drug therapy: Secondary | ICD-10-CM

## 2016-02-06 DIAGNOSIS — E1129 Type 2 diabetes mellitus with other diabetic kidney complication: Secondary | ICD-10-CM | POA: Diagnosis present

## 2016-02-06 DIAGNOSIS — I83029 Varicose veins of left lower extremity with ulcer of unspecified site: Secondary | ICD-10-CM | POA: Diagnosis present

## 2016-02-06 DIAGNOSIS — E785 Hyperlipidemia, unspecified: Secondary | ICD-10-CM | POA: Diagnosis present

## 2016-02-06 LAB — URINALYSIS, ROUTINE W REFLEX MICROSCOPIC
BILIRUBIN URINE: NEGATIVE
Bilirubin Urine: NEGATIVE
GLUCOSE, UA: NEGATIVE mg/dL
Glucose, UA: NEGATIVE mg/dL
KETONES UR: NEGATIVE mg/dL
Ketones, ur: NEGATIVE mg/dL
Nitrite: NEGATIVE
Nitrite: NEGATIVE
PH: 6 (ref 5.0–8.0)
PROTEIN: 100 mg/dL — AB
PROTEIN: 100 mg/dL — AB
SPECIFIC GRAVITY, URINE: 1.017 (ref 1.005–1.030)
Specific Gravity, Urine: 1.021 (ref 1.005–1.030)
pH: 6 (ref 5.0–8.0)

## 2016-02-06 LAB — BASIC METABOLIC PANEL
Anion gap: 11 (ref 5–15)
BUN: 42 mg/dL — ABNORMAL HIGH (ref 6–20)
CO2: 23 mmol/L (ref 22–32)
CREATININE: 1.93 mg/dL — AB (ref 0.44–1.00)
Calcium: 9.4 mg/dL (ref 8.9–10.3)
Chloride: 107 mmol/L (ref 101–111)
GFR calc Af Amer: 24 mL/min — ABNORMAL LOW (ref >60)
GFR, EST NON AFRICAN AMERICAN: 20 mL/min — AB (ref >60)
GLUCOSE: 162 mg/dL — AB (ref 65–99)
POTASSIUM: 4.1 mmol/L (ref 3.5–5.1)
Sodium: 141 mmol/L (ref 135–145)

## 2016-02-06 LAB — URINE MICROSCOPIC-ADD ON

## 2016-02-06 LAB — CBC
HEMATOCRIT: 38.9 % (ref 36.0–46.0)
Hemoglobin: 13 g/dL (ref 12.0–15.0)
MCH: 29.3 pg (ref 26.0–34.0)
MCHC: 33.4 g/dL (ref 30.0–36.0)
MCV: 87.8 fL (ref 78.0–100.0)
PLATELETS: 258 10*3/uL (ref 150–400)
RBC: 4.43 MIL/uL (ref 3.87–5.11)
RDW: 15.8 % — ABNORMAL HIGH (ref 11.5–15.5)
WBC: 7.6 10*3/uL (ref 4.0–10.5)

## 2016-02-06 LAB — HEPATIC FUNCTION PANEL
ALT: 10 U/L — ABNORMAL LOW (ref 14–54)
AST: 31 U/L (ref 15–41)
Albumin: 3.5 g/dL (ref 3.5–5.0)
Alkaline Phosphatase: 74 U/L (ref 38–126)
BILIRUBIN DIRECT: 0.2 mg/dL (ref 0.1–0.5)
Indirect Bilirubin: 0.5 mg/dL (ref 0.3–0.9)
Total Bilirubin: 0.7 mg/dL (ref 0.3–1.2)
Total Protein: 7.9 g/dL (ref 6.5–8.1)

## 2016-02-06 MED ORDER — SODIUM CHLORIDE 0.9 % IV SOLN
Freq: Once | INTRAVENOUS | Status: AC
  Administered 2016-02-06: 20:00:00 via INTRAVENOUS

## 2016-02-06 MED ORDER — METRONIDAZOLE 500 MG PO TABS
2000.0000 mg | ORAL_TABLET | Freq: Once | ORAL | Status: AC
  Administered 2016-02-06: 2000 mg via ORAL
  Filled 2016-02-06: qty 4

## 2016-02-06 MED ORDER — FLUCONAZOLE 100 MG PO TABS
150.0000 mg | ORAL_TABLET | Freq: Once | ORAL | Status: AC
  Administered 2016-02-06: 150 mg via ORAL
  Filled 2016-02-06: qty 2

## 2016-02-06 MED ORDER — DEXTROSE 5 % IV SOLN
1.0000 g | Freq: Once | INTRAVENOUS | Status: AC
  Administered 2016-02-06: 1 g via INTRAVENOUS
  Filled 2016-02-06: qty 10

## 2016-02-06 MED ORDER — SODIUM CHLORIDE 0.9 % IV BOLUS (SEPSIS)
500.0000 mL | Freq: Once | INTRAVENOUS | Status: AC
  Administered 2016-02-06: 500 mL via INTRAVENOUS

## 2016-02-06 NOTE — ED Notes (Addendum)
Attempted IV without success.

## 2016-02-06 NOTE — ED Provider Notes (Signed)
[facility] DEPT Provider Note   CSN: [id number] Arrival date & time: 02/06/16  1314     History   Chief Complaint Chief Complaint  Patient presents with  . Weakness  . Dizziness    HPI Seth Borruso is a 82 y.o. female.  The history is provided by the patient and a relative.  Weakness  Primary symptoms include dizziness. Pertinent negatives include no shortness of breath and no chest pain.  Dizziness  Associated symptoms: weakness   Associated symptoms: no chest pain and no shortness of breath   Patient presents with generalized weakness and dizziness. Feels lightheaded. She's had for last few days. Poorly has a decreased oral intake. Reportedly has had some urinary frequency. Has a chronically poor appetite and chronic wounds on her lower legs. Reportedly the legs were dressed again yesterday. No recent change in medications. No fevers or chills. No chest pain. Occasional cough. She also has pain in both of her upper extremities. The pain is dull.  Past Medical History:  Diagnosis Date  . Diabetes ([place])    SLOW TO HEAL  . Gout   . HBP (high blood pressure)   . Kidney disease   . Poor circulation     Patient Active Problem List   Diagnosis Date Noted  . PAOD (peripheral arterial occlusive disease) ([place]) 11/03/2014  . Chronic venous insufficiency 08/18/2014  . Venous stasis ulcer of right lower extremity ([place]) 08/18/2014  . Venous stasis ulcer of left lower extremity ([place]) 08/18/2014    Past Surgical History:  Procedure Laterality Date  . APPENDECTOMY    . CATARACT EXTRACTION Bilateral     OB History    No data available       Home Medications    Prior to Admission medications   Medication Sig Start Date End Date Taking? Authorizing Provider  amLODipine (NORVASC) 10 MG tablet  08/09/14   Historical Provider, MD  AMLODIPINE BESYLATE PO TAKE 10 MG ONE A DAY    Historical Provider, MD  atorvastatin (LIPITOR) 40 MG tablet  09/24/13   Historical Provider,  MD  colchicine (COLCRYS) 0.6 MG tablet Take 0.6 mg by mouth as needed.    Historical Provider, MD  lisinopril (PRINIVIL,ZESTRIL) 20 MG tablet Take 20 mg by mouth daily.    Historical Provider, MD  metoprolol succinate (TOPROL-XL) 100 MG 24 hr tablet Take 100 mg by mouth daily. Take with or immediately following a meal.    Historical Provider, MD  silver sulfADIAZINE (SILVADENE) 1 % cream  08/29/13   Historical Provider, MD  ULORIC 40 MG tablet  09/24/13   Historical Provider, MD    Family History No family history on file.  Social History Social History  Substance Use Topics  . Smoking status: Former Smoker    Types: Cigarettes    Quit date: 01/19/1973  . Smokeless tobacco: Never Used  . Alcohol use No     Allergies   Review of patient's allergies indicates no known allergies.   Review of Systems Review of Systems  Constitutional: Negative for appetite change.  HENT: Negative for congestion.   Respiratory: Positive for cough. Negative for shortness of breath.   Cardiovascular: Negative for chest pain.  Gastrointestinal: Negative for abdominal pain.  Endocrine: Negative for polyphagia and polyuria.  Genitourinary: Positive for dysuria.  Musculoskeletal: Negative for back pain.  Skin: Positive for wound.  Neurological: Positive for dizziness and weakness.     Physical Exam Updated Vital Signs BP 147/78   Pulse 80  Temp 97.9 F (36.6 C) (Oral)   Resp 19   SpO2 99%   Physical Exam  Constitutional: She appears well-developed.  Kyphosis  HENT:  Head: Atraumatic.  Eyes: EOM are normal.  Cardiovascular: Normal rate.   Pulmonary/Chest: Effort normal. She has no rales.  Abdominal: Soft. There is no tenderness.  Musculoskeletal: Edema: Chronic venous changes without clear acute infection.  Neurological: She is alert.  Skin: Skin is warm. Capillary refill takes less than 2 seconds.     ED Treatments / Results  Labs (all labs ordered are listed, but only abnormal  results are displayed) Labs Reviewed  BASIC METABOLIC PANEL - Abnormal; Notable for the following:       Result Value   Glucose, Bld 162 (*)    BUN 42 (*)    Creatinine, Ser 1.93 (*)    GFR calc non Af Amer 20 (*)    GFR calc Af Amer 24 (*)    All other components within normal limits  CBC - Abnormal; Notable for the following:    RDW 15.8 (*)    All other components within normal limits  URINALYSIS, ROUTINE W REFLEX MICROSCOPIC (NOT AT [facility])  HEPATIC FUNCTION PANEL  I-STAT TROPOININ, ED    EKG  EKG Interpretation  Date/Time:  Wednesday February 06 2016 13:20:21 EDT Ventricular Rate:  87 PR Interval:  180 QRS Duration: 74 QT Interval:  374 QTC Calculation: 450 R Axis:   6 Text Interpretation:  Sinus rhythm with Premature supraventricular complexes Anterior infarct , age undetermined Abnormal ECG baseline artifact Confirmed by Alvino Chapel  MD, Caliana Spires [phone]) on 02/06/2016 2:45:15 PM       Radiology Dg Chest Portable 1 View  Result Date: 02/06/2016 CLINICAL DATA:  Dizziness.  Cough. EXAM: PORTABLE CHEST 1 VIEW COMPARISON:  No recent prior . FINDINGS: Mediastinum hilar structures are normal. Low lung volumes with mild basilar atelectasis. Heart size normal. Tiny left pleural effusion cannot be excluded. No pneumothorax. No acute bony abnormality. Degenerative changes both shoulders. IMPRESSION: Low lung volumes with mild basilar atelectasis. Tiny left pleural effusion cannot be excluded. Exam is otherwise unremarkable. Electronically Signed   By: Marcello Moores  Register   On: 02/06/2016 14:16    Procedures Procedures (including critical care time)  Medications Ordered in ED Medications - No data to display   Initial Impression / Assessment and Plan / ED Course  I have reviewed the triage vital signs and the nursing notes.  Pertinent labs & imaging results that were available during my care of the patient were reviewed by me and considered in my medical decision making (see chart  for details).  Clinical Course     Patient with generalized weakness. States she's had some urinary issues. States she does not drink as much supposed to. Having difficulty walking. Has a renal insufficiency with creatinine 1.9 but unsure of baseline. Urinalysis pending. Will likely require admission. Final Clinical Impressions(s) / ED Diagnoses   Final diagnoses:  Generalized weakness    New Prescriptions New Prescriptions   No medications on file     Davonna Belling, MD 02/06/16 1550

## 2016-02-06 NOTE — ED Notes (Signed)
Pts dressings removed from both of her legs. The dressings started on the feet and went up to just below her knees. There is extensive skin break down on both legs. It is difficult to fully assess the break down due to medicated cream that has previously been applied to the legs and is difficult to remove.

## 2016-02-06 NOTE — H&P (Signed)
Jill Allen [id number] DOB: 1915-08-13 DOA: 02/06/2016     PCP: [name], MD   Outpatient Specialists: none Patient coming from:   home Lives alone,        Chief Complaint: Too weak to stand up or transfer  HPI: Jill Allen is a 82 y.o. female with medical history significant of HTN, HLD, venous stasis, DM 2, CKD, venous stasis ulcers, peripheral arterial disease    Presented with dizziness light too weak to transfer out of her wheelchair which is not her baseline. Symptoms been going on for past 2 days. Did not endorse any chest pain no shortness of breath no nausea no vomiting no diarrhea no fever associated this.  Examination does endorse poor by mouth intake increased urinary frequency her lower extremity wounds have been healing better. Her legs have been dressed just yesterday. She's been having upper extremity pain which is worse with use of her arms. Patient denies being sexually active Regarding pertinent Chronic problems: She has long-standing history of venous stasis ulcers under wound  care clinic management.  IN ER:  Temp (24hrs), Avg:97.9 F (36.6 C), Min:97.9 F (36.6 C), Max:97.9 F (36.6 C)    Satting 100% room air blood pressure 178/89 heart rate 100   Cr 1.93 up from 1.5 at baseline Chest x-ray show along volumes UA positive for yeast and Trichomonas Following Medications were ordered in ER: Medications  cefTRIAXone (ROCEPHIN) 1 g in dextrose 5 % 50 mL IVPB (not administered)  0.9 %  sodium chloride infusion (not administered)  fluconazole (DIFLUCAN) tablet 150 mg (not administered)  metroNIDAZOLE (FLAGYL) tablet 2,000 mg (not administered)  sodium chloride 0.9 % bolus 500 mL (500 mLs Intravenous New Bag/Given 02/06/16 1756)     Hospitalist was called for admission for dehydration  Review of Systems:    Pertinent positives include:  fatigue,  Constitutional:  No weight loss, night sweats, Fevers, chills, weight loss  HEENT:  No  headaches, Difficulty swallowing,Tooth/dental problems,Sore throat,  No sneezing, itching, ear ache, nasal congestion, post nasal drip,  Cardio-vascular:  No chest pain, Orthopnea, PND, anasarca, dizziness, palpitations.no Bilateral lower extremity swelling  GI:  No heartburn, indigestion, abdominal pain, nausea, vomiting, diarrhea, change in bowel habits, loss of appetite, melena, blood in stool, hematemesis Resp:  no shortness of breath at rest. No dyspnea on exertion, No excess mucus, no productive cough, No non-productive cough, No coughing up of blood.No change in color of mucus.No wheezing. Skin:  no rash or lesions. No jaundice GU:  no dysuria, change in color of urine, no urgency or frequency. No straining to urinate.  No flank pain.  Musculoskeletal:  No joint pain or no joint swelling. No decreased range of motion. No back pain.  Psych:  No change in mood or affect. No depression or anxiety. No memory loss.  Neuro: no localizing neurological complaints, no tingling, no weakness, no double vision, no gait abnormality, no slurred speech, no confusion  As per HPI otherwise 10 point review of systems negative.   Past Medical History: Past Medical History:  Diagnosis Date  . Diabetes ([place])    SLOW TO HEAL  . Gout   . HBP (high blood pressure)   . Kidney disease   . Poor circulation    Past Surgical History:  Procedure Laterality Date  . APPENDECTOMY    . CATARACT EXTRACTION Bilateral      Social History:  Ambulatory wheelchair bound    reports that she quit smoking about 43 years  ago. Her smoking use included Cigarettes. She has never used smokeless tobacco. She reports that she does not drink alcohol or use drugs.  Allergies:  No Known Allergies     Family History:   Family History  Problem Relation Age of Onset  . Diabetes Mother   . Hypertension Brother   . Cancer Neg Hx     Medications: Prior to Admission medications   Medication Sig Start Date  End Date Taking? Authorizing Provider  acetaminophen (TYLENOL) 500 MG tablet Take 500 mg by mouth every 8 (eight) hours as needed for moderate pain.   Yes Historical Provider, MD  amLODipine (NORVASC) 10 MG tablet Take 10 mg by mouth daily.  08/09/14  Yes Historical Provider, MD  aspirin EC 81 MG tablet Take 81 mg by mouth daily.   Yes Historical Provider, MD  atorvastatin (LIPITOR) 40 MG tablet Take 40 mg by mouth daily.  09/24/13  Yes Historical Provider, MD  lisinopril (PRINIVIL,ZESTRIL) 20 MG tablet Take 20 mg by mouth daily.   Yes Historical Provider, MD  metoprolol succinate (TOPROL-XL) 100 MG 24 hr tablet Take 100 mg by mouth daily. Take with or immediately following a meal.   Yes Historical Provider, MD    Physical Exam: Patient Vitals for the past 24 hrs:  BP Temp Temp src Pulse Resp SpO2  02/06/16 1900 178/89 - - 99 15 100 %  02/06/16 1845 158/81 - - 95 16 99 %  02/06/16 1830 173/85 - - 94 17 100 %  02/06/16 1815 157/96 - - 88 16 98 %  02/06/16 1800 134/74 - - 95 19 99 %  02/06/16 1745 172/84 - - 89 18 98 %  02/06/16 1730 161/72 - - 91 12 100 %  02/06/16 1715 173/89 - - 93 15 98 %  02/06/16 1630 138/87 - - 88 18 99 %  02/06/16 1615 149/70 - - 84 20 99 %  02/06/16 1600 159/92 - - 82 15 99 %  02/06/16 1530 161/59 - - 84 12 98 %  02/06/16 1515 156/89 - - 78 13 98 %  02/06/16 1500 158/89 - - 86 15 100 %  02/06/16 1445 147/78 - - 80 19 99 %  02/06/16 1400 115/76 - - 83 13 100 %  02/06/16 1354 158/77 - - 89 22 99 %  02/06/16 1320 160/92 97.9 F (36.6 C) Oral 84 18 99 %    1. General:  in No Acute distress 2. Psychological: Alert and Oriented 3. Head/ENT:     Dry Mucous Membranes                          Head Non traumatic, neck supple                            Poor Dentition 4. SKIN:   decreased Skin turgor,  Skin clean Dry multiple areas of break down on lower ext bilateral no evidence of infection. Yeast infection present under breasts and pannus, 5. Heart: Regular rate  and rhythm no  Murmur, Rub or gallop 6. Lungs:   no wheezes or crackles   7. Abdomen: Soft,  non-tender, Non distended 8. Lower extremities: no clubbing, cyanosis, or edema 9. Neurologically Grossly intact, moving all 4 extremities equally  10. MSK: Normal range of motion   body mass index is unknown because there is no height or weight on file.  Labs on Admission:  Labs on Admission: I have personally reviewed following labs and imaging studies  CBC:  Recent Labs Lab 02/06/16 1324  WBC 7.6  HGB 13.0  HCT 38.9  MCV 87.8  PLT 0000000   Basic Metabolic Panel:  Recent Labs Lab 02/06/16 1324  NA 141  K 4.1  CL 107  CO2 23  GLUCOSE 162*  BUN 42*  CREATININE 1.93*  CALCIUM 9.4   GFR: CrCl cannot be calculated (Unknown ideal weight.). Liver Function Tests:  Recent Labs Lab 02/06/16 1436  AST 31  ALT 10*  ALKPHOS 74  BILITOT 0.7  PROT 7.9  ALBUMIN 3.5   No results for input(s): LIPASE, AMYLASE in the last 168 hours. No results for input(s): AMMONIA in the last 168 hours. Coagulation Profile: No results for input(s): INR, PROTIME in the last 168 hours. Cardiac Enzymes: No results for input(s): CKTOTAL, CKMB, CKMBINDEX, TROPONINI in the last 168 hours. BNP (last 3 results) No results for input(s): PROBNP in the last 8760 hours. HbA1C: No results for input(s): HGBA1C in the last 72 hours. CBG: No results for input(s): GLUCAP in the last 168 hours. Lipid Profile: No results for input(s): CHOL, HDL, LDLCALC, TRIG, CHOLHDL, LDLDIRECT in the last 72 hours. Thyroid Function Tests: No results for input(s): TSH, T4TOTAL, FREET4, T3FREE, THYROIDAB in the last 72 hours. Anemia Panel: No results for input(s): VITAMINB12, FOLATE, FERRITIN, TIBC, IRON, RETICCTPCT in the last 72 hours. Urine analysis:    Component Value Date/Time   COLORURINE YELLOW 02/06/2016 1810   APPEARANCEUR CLOUDY (A) 02/06/2016 1810   LABSPEC 1.021 02/06/2016 1810   PHURINE 6.0 02/06/2016 1810    GLUCOSEU NEGATIVE 02/06/2016 1810   HGBUR MODERATE (A) 02/06/2016 1810   BILIRUBINUR NEGATIVE 02/06/2016 1810   KETONESUR NEGATIVE 02/06/2016 1810   PROTEINUR 100 (A) 02/06/2016 1810   NITRITE NEGATIVE 02/06/2016 1810   LEUKOCYTESUR LARGE (A) 02/06/2016 1810   Sepsis Labs: @LABRCNTIP (procalcitonin:4,lacticidven:4) )No results found for this or any previous visit (from the past 240 hour(s)).     UA   evidence of UTI   No results found for: HGBA1C  CrCl cannot be calculated (Unknown ideal weight.).  BNP (last 3 results) No results for input(s): PROBNP in the last 8760 hours.   ECG REPORT  Independently reviewed Rate: 87  Rhythm: normal rhythm ST&T Change: No acute ischemic changes   QTC 450  There were no vitals filed for this visit.   Cultures: No results found for: [place], [place], [place], REPTSTATUS   Radiological Exams on Admission: Dg Chest Portable 1 View  Result Date: 02/06/2016 CLINICAL DATA:  Dizziness.  Cough. EXAM: PORTABLE CHEST 1 VIEW COMPARISON:  No recent prior . FINDINGS: Mediastinum hilar structures are normal. Low lung volumes with mild basilar atelectasis. Heart size normal. Tiny left pleural effusion cannot be excluded. No pneumothorax. No acute bony abnormality. Degenerative changes both shoulders. IMPRESSION: Low lung volumes with mild basilar atelectasis. Tiny left pleural effusion cannot be excluded. Exam is otherwise unremarkable. Electronically Signed   By: Marcello Moores  Register   On: 02/06/2016 14:16    Chart has been reviewed    Assessment/Plan  82 y.o. female with medical history significant of HTN, HLD, venous stasis, DM 2, CKD, venous stasis ulcers, peripheral arterial disease admitted for dehydration worsening debility, acute on chronic renal failure  Present on Admission: . UTI (urinary tract infection) treat with Rocephin await results of urine culture. Trichomonas and urine possible contaminant. Patient denies any sexual contacts.  Denies anybody abusing her. Patient was  questioned by herself states she is safe at her home . Venous stasis ulcer of left lower extremity (HCC) chronic continue wound care . Dehydration restart IV fluids check orthostatics . Acute renal failure (ARF) (HCC) acute on chronic cranial1.5butthatwasn't2013.Willrehydrateandfollowrenalfunction Patient reports she's been having trouble getting to the bathroom will obtain renal ultrasound to evaluate for any evidence of hydronephrosis or obstruction hold lisinopril for now . CKD (chronic kidney disease), stage III . DM (diabetes mellitus), type 2 with renal complications (HCC) - SSI, usually diet controlled, order Hg A1C . Venous stasis - chronic wound care consult   Other plan as per orders.  DVT prophylaxis:    Lovenox     Code Status:    DNR/DNI   as per patient    Family Communication:   Family   at  Bedside  plan of care was discussed with Joseph Berkshire  Disposition Plan:    To home once workup is complete and patient is stable refuses placement                       Would benefit from PT/OT eval prior to DC   ordered                                                Consults called: none   Admission status: obs   Level of care      medical floor         I have spent a total of 55 min on this admission  Jill Allen 02/06/2016, 8:10 PM    [facility]  Pager [phone]   after 2 AM please page floor coverage PA If 7AM-7PM, please contact the day team taking care of the patient  [email or web address]  Password [email or web address]

## 2016-02-06 NOTE — ED Notes (Addendum)
Dr. Darnell Level requests that pt have a second urine sample done, this time using a I&O cath

## 2016-02-06 NOTE — ED Triage Notes (Signed)
Patient complains of dizziness and weakness that started on Monday, also complains of bilateral arm pain with movement. No CP, no shortness of breath. Alert on arrival

## 2016-02-06 NOTE — ED Notes (Signed)
Dr. Darnell Level notified that I was unable to do I&O cath and had to "clean catch" the urine sample.

## 2016-02-06 NOTE — ED Provider Notes (Signed)
Care assumed from Dr. Alvino Chapel. Patient awaiting urinalysis. Needs admission for failure to thrive, weakness and inability to ambulate.  UA with infection.  Also with yeast and trichomonas.  Patient denies sexual activity.  Question contamination.  Admission d.w Dr. Roel Cluck.   Ezequiel Essex, MD 02/06/16 [phone]

## 2016-02-06 NOTE — ED Notes (Signed)
Call to 6E to give report.  Await call back from RN

## 2016-02-07 ENCOUNTER — Observation Stay (HOSPITAL_COMMUNITY): Payer: Medicare Other

## 2016-02-07 DIAGNOSIS — N3000 Acute cystitis without hematuria: Secondary | ICD-10-CM | POA: Diagnosis not present

## 2016-02-07 DIAGNOSIS — B3749 Other urogenital candidiasis: Secondary | ICD-10-CM | POA: Diagnosis present

## 2016-02-07 DIAGNOSIS — E785 Hyperlipidemia, unspecified: Secondary | ICD-10-CM | POA: Diagnosis present

## 2016-02-07 DIAGNOSIS — Z8249 Family history of ischemic heart disease and other diseases of the circulatory system: Secondary | ICD-10-CM | POA: Diagnosis not present

## 2016-02-07 DIAGNOSIS — I878 Other specified disorders of veins: Secondary | ICD-10-CM

## 2016-02-07 DIAGNOSIS — I129 Hypertensive chronic kidney disease with stage 1 through stage 4 chronic kidney disease, or unspecified chronic kidney disease: Secondary | ICD-10-CM | POA: Diagnosis present

## 2016-02-07 DIAGNOSIS — Z833 Family history of diabetes mellitus: Secondary | ICD-10-CM | POA: Diagnosis not present

## 2016-02-07 DIAGNOSIS — E11622 Type 2 diabetes mellitus with other skin ulcer: Secondary | ICD-10-CM | POA: Diagnosis present

## 2016-02-07 DIAGNOSIS — Z7982 Long term (current) use of aspirin: Secondary | ICD-10-CM | POA: Diagnosis not present

## 2016-02-07 DIAGNOSIS — E860 Dehydration: Secondary | ICD-10-CM

## 2016-02-07 DIAGNOSIS — R7989 Other specified abnormal findings of blood chemistry: Secondary | ICD-10-CM | POA: Diagnosis not present

## 2016-02-07 DIAGNOSIS — E1122 Type 2 diabetes mellitus with diabetic chronic kidney disease: Secondary | ICD-10-CM | POA: Diagnosis not present

## 2016-02-07 DIAGNOSIS — M109 Gout, unspecified: Secondary | ICD-10-CM | POA: Diagnosis present

## 2016-02-07 DIAGNOSIS — Z87891 Personal history of nicotine dependence: Secondary | ICD-10-CM | POA: Diagnosis not present

## 2016-02-07 DIAGNOSIS — E1151 Type 2 diabetes mellitus with diabetic peripheral angiopathy without gangrene: Secondary | ICD-10-CM | POA: Diagnosis present

## 2016-02-07 DIAGNOSIS — Z66 Do not resuscitate: Secondary | ICD-10-CM | POA: Diagnosis present

## 2016-02-07 DIAGNOSIS — N179 Acute kidney failure, unspecified: Secondary | ICD-10-CM

## 2016-02-07 DIAGNOSIS — L97929 Non-pressure chronic ulcer of unspecified part of left lower leg with unspecified severity: Secondary | ICD-10-CM | POA: Diagnosis present

## 2016-02-07 DIAGNOSIS — Z79899 Other long term (current) drug therapy: Secondary | ICD-10-CM | POA: Diagnosis not present

## 2016-02-07 DIAGNOSIS — Z993 Dependence on wheelchair: Secondary | ICD-10-CM | POA: Diagnosis not present

## 2016-02-07 DIAGNOSIS — N183 Chronic kidney disease, stage 3 (moderate): Secondary | ICD-10-CM

## 2016-02-07 DIAGNOSIS — I83029 Varicose veins of left lower extremity with ulcer of unspecified site: Secondary | ICD-10-CM | POA: Diagnosis not present

## 2016-02-07 LAB — COMPREHENSIVE METABOLIC PANEL
ALBUMIN: 3.1 g/dL — AB (ref 3.5–5.0)
ALK PHOS: 65 U/L (ref 38–126)
ALT: 9 U/L — AB (ref 14–54)
AST: 28 U/L (ref 15–41)
Anion gap: 11 (ref 5–15)
BUN: 34 mg/dL — AB (ref 6–20)
CALCIUM: 8.8 mg/dL — AB (ref 8.9–10.3)
CO2: 21 mmol/L — AB (ref 22–32)
CREATININE: 1.67 mg/dL — AB (ref 0.44–1.00)
Chloride: 110 mmol/L (ref 101–111)
GFR calc Af Amer: 28 mL/min — ABNORMAL LOW (ref >60)
GFR calc non Af Amer: 24 mL/min — ABNORMAL LOW (ref >60)
GLUCOSE: 154 mg/dL — AB (ref 65–99)
Potassium: 3.5 mmol/L (ref 3.5–5.1)
SODIUM: 142 mmol/L (ref 135–145)
Total Bilirubin: 0.5 mg/dL (ref 0.3–1.2)
Total Protein: 7.2 g/dL (ref 6.5–8.1)

## 2016-02-07 LAB — CBC
HCT: 37.4 % (ref 36.0–46.0)
Hemoglobin: 12.3 g/dL (ref 12.0–15.0)
MCH: 28.7 pg (ref 26.0–34.0)
MCHC: 32.9 g/dL (ref 30.0–36.0)
MCV: 87.4 fL (ref 78.0–100.0)
PLATELETS: 244 10*3/uL (ref 150–400)
RBC: 4.28 MIL/uL (ref 3.87–5.11)
RDW: 16.1 % — ABNORMAL HIGH (ref 11.5–15.5)
WBC: 7.2 10*3/uL (ref 4.0–10.5)

## 2016-02-07 LAB — TSH: TSH: 0.395 u[IU]/mL (ref 0.350–4.500)

## 2016-02-07 LAB — GLUCOSE, CAPILLARY
GLUCOSE-CAPILLARY: 147 mg/dL — AB (ref 65–99)
GLUCOSE-CAPILLARY: 164 mg/dL — AB (ref 65–99)
GLUCOSE-CAPILLARY: 183 mg/dL — AB (ref 65–99)
Glucose-Capillary: 214 mg/dL — ABNORMAL HIGH (ref 65–99)
Glucose-Capillary: 191 mg/dL — ABNORMAL HIGH (ref 65–99)

## 2016-02-07 LAB — PREALBUMIN: PREALBUMIN: 15.9 mg/dL — AB (ref 18–38)

## 2016-02-07 LAB — PHOSPHORUS: Phosphorus: 2.8 mg/dL (ref 2.5–4.6)

## 2016-02-07 LAB — MAGNESIUM: Magnesium: 1.9 mg/dL (ref 1.7–2.4)

## 2016-02-07 MED ORDER — ONDANSETRON HCL 4 MG PO TABS: 4.0000 mg | ORAL_TABLET | Freq: Four times a day (QID) | ORAL | Status: DC | PRN

## 2016-02-07 MED ORDER — POLYETHYLENE GLYCOL 3350 17 G PO PACK
17.0000 g | PACK | Freq: Every day | ORAL | Status: DC | PRN
  Administered 2016-02-09: 17 g via ORAL
  Filled 2016-02-07: qty 1

## 2016-02-07 MED ORDER — ACETAMINOPHEN 650 MG RE SUPP: 650.0000 mg | Freq: Four times a day (QID) | RECTAL | Status: DC | PRN

## 2016-02-07 MED ORDER — SODIUM CHLORIDE 0.9 % IV SOLN
INTRAVENOUS | Status: AC
  Administered 2016-02-07: via INTRAVENOUS

## 2016-02-07 MED ORDER — AMLODIPINE BESYLATE 10 MG PO TABS
10.0000 mg | ORAL_TABLET | Freq: Every day | ORAL | Status: DC
  Administered 2016-02-07 – 2016-02-09 (×3): 10 mg via ORAL
  Filled 2016-02-07 (×3): qty 1

## 2016-02-07 MED ORDER — SENNA 8.6 MG PO TABS
1.0000 | ORAL_TABLET | Freq: Two times a day (BID) | ORAL | Status: DC
  Administered 2016-02-07 – 2016-02-08 (×4): 8.6 mg via ORAL
  Filled 2016-02-07 (×6): qty 1

## 2016-02-07 MED ORDER — INSULIN ASPART 100 UNIT/ML ~~LOC~~ SOLN: 0.0000 [IU] | Freq: Every day | SUBCUTANEOUS | Status: DC

## 2016-02-07 MED ORDER — ENOXAPARIN SODIUM 30 MG/0.3ML ~~LOC~~ SOLN
30.0000 mg | SUBCUTANEOUS | Status: DC
  Administered 2016-02-07 – 2016-02-08 (×2): 30 mg via SUBCUTANEOUS
  Filled 2016-02-07 (×2): qty 0.3

## 2016-02-07 MED ORDER — ATORVASTATIN CALCIUM 40 MG PO TABS
40.0000 mg | ORAL_TABLET | Freq: Every day | ORAL | Status: DC
  Administered 2016-02-07 – 2016-02-09 (×3): 40 mg via ORAL
  Filled 2016-02-07 (×3): qty 1

## 2016-02-07 MED ORDER — INSULIN ASPART 100 UNIT/ML ~~LOC~~ SOLN
0.0000 [IU] | Freq: Three times a day (TID) | SUBCUTANEOUS | Status: DC
  Administered 2016-02-07 (×2): 2 [IU] via SUBCUTANEOUS
  Administered 2016-02-08: 3 [IU] via SUBCUTANEOUS

## 2016-02-07 MED ORDER — ASPIRIN EC 81 MG PO TBEC
81.0000 mg | DELAYED_RELEASE_TABLET | Freq: Every day | ORAL | Status: DC
  Administered 2016-02-07 – 2016-02-09 (×3): 81 mg via ORAL
  Filled 2016-02-07 (×3): qty 1

## 2016-02-07 MED ORDER — ONDANSETRON HCL 4 MG/2ML IJ SOLN: 4.0000 mg | Freq: Four times a day (QID) | INTRAMUSCULAR | Status: DC | PRN

## 2016-02-07 MED ORDER — GLUCERNA SHAKE PO LIQD
237.0000 mL | Freq: Two times a day (BID) | ORAL | Status: DC
  Administered 2016-02-08 – 2016-02-09 (×3): 237 mL via ORAL

## 2016-02-07 MED ORDER — NYSTATIN 100000 UNIT/GM EX POWD
Freq: Three times a day (TID) | CUTANEOUS | Status: DC
  Administered 2016-02-07 – 2016-02-09 (×6): via TOPICAL
  Filled 2016-02-07: qty 15

## 2016-02-07 MED ORDER — ACETAMINOPHEN 325 MG PO TABS
650.0000 mg | ORAL_TABLET | Freq: Four times a day (QID) | ORAL | Status: DC | PRN
  Administered 2016-02-08 (×2): 650 mg via ORAL
  Filled 2016-02-07 (×3): qty 2

## 2016-02-07 MED ORDER — METOPROLOL SUCCINATE ER 100 MG PO TB24
100.0000 mg | ORAL_TABLET | Freq: Every day | ORAL | Status: DC
  Administered 2016-02-07 – 2016-02-09 (×3): 100 mg via ORAL
  Filled 2016-02-07 (×3): qty 1

## 2016-02-07 MED ORDER — HYDROCODONE-ACETAMINOPHEN 5-325 MG PO TABS
1.0000 | ORAL_TABLET | ORAL | Status: DC | PRN
  Administered 2016-02-07 (×2): 1 via ORAL
  Administered 2016-02-08: 2 via ORAL
  Administered 2016-02-08 – 2016-02-09 (×2): 1 via ORAL
  Administered 2016-02-09: 2 via ORAL
  Filled 2016-02-07: qty 2
  Filled 2016-02-07 (×7): qty 1

## 2016-02-07 MED ORDER — DEXTROSE 5 % IV SOLN
1.0000 g | INTRAVENOUS | Status: DC
  Administered 2016-02-08: 1 g via INTRAVENOUS
  Filled 2016-02-07 (×2): qty 10

## 2016-02-07 NOTE — Progress Notes (Signed)
82 year old female admitted to observation. for Acute on chronic renal failure with urinary tract infection. Currently requires moderate assist for transfers. Will monitor therapy progress and if she is admitted to the hospital and still requiring physical assistance may be a candidate for inpatient rehabilitation.

## 2016-02-07 NOTE — Care Management Obs Status (Signed)
[place] NOTIFICATION   Patient Details  Name: Jill Allen MRN: [id number] Date of Birth: 09-16-1915   Medicare Observation Status Notification Given:  Yes    Vivica Dobosz, Rory Percy, RN 02/07/2016, 2:45 PM

## 2016-02-07 NOTE — Evaluation (Signed)
Occupational Therapy Evaluation Patient Details Name: Jill Allen MRN: [id number] DOB: 1915-10-16 Today's Date: 02/07/2016    History of Present Illness Patient is a 82 yo female admitted 02/06/16 with dizziness, weakness.  Patient with dehydration, UTI, acute on chronic renal failure.   PMH:  HTN, HLD, venous stasis with ulcers LE's, DM, CKD, PAD   Clinical Impression   Prior to admission, pt was able to perform lateral transfers, propel her manual w/c, sponge bathe, toilet, and dress herself. She was dependent in housekeeping and meal prep, but managed her own medication. Pt presents with inconsistent confusion, generalized weakness, longstanding shoulder limitations and decreased activity tolerance impeding ability to care for herself and mobilize. Will follow acutely with plan for pt to go to inpatient rehab.  Follow Up Recommendations  CIR    Equipment Recommendations       Recommendations for Other Services       Precautions / Restrictions Precautions Precautions: Fall Restrictions Weight Bearing Restrictions: No      Mobility Bed Mobility Overal bed mobility: Needs Assistance Bed Mobility: Supine to Sit;Sit to Supine     Supine to sit: Mod assist;HOB elevated Sit to supine: Mod assist   General bed mobility comments: assist for all aspects  Transfers                 General transfer comment: pt typically does lateral transfer to and from w/c    Balance     Sitting balance-Leahy Scale: Fair                                      ADL Overall ADL's : Needs assistance/impaired Eating/Feeding: Set up;Bed level   Grooming: Wash/dry hands;Wash/dry face;Oral care;Sitting;Minimal assistance   Upper Body Bathing: Minimal assitance;Sitting   Lower Body Bathing: Total assistance;Bed level   Upper Body Dressing : Minimal assistance;Sitting   Lower Body Dressing: Total assistance;Bed level                       Vision      Perception     Praxis      Pertinent Vitals/Pain Faces Pain Scale: Hurts a little bit Pain Location: LEs Pain Descriptors / Indicators: Sore Pain Intervention(s): Limited activity within patient's tolerance;Monitored during session;Repositioned;Premedicated before session     Hand Dominance Right   Extremity/Trunk Assessment Upper Extremity Assessment Upper Extremity Assessment: LUE deficits/detail RUE Deficits / Details: Decreased shoulder ROM to approx 45* flexion; Strength grossly 3-/5;  Decreased hand function RUE Coordination: decreased gross motor;decreased fine motor LUE Deficits / Details: Decreased shoulder ROM to approx 45* flexion; Strength grossly 2+/5;  Decreased hand function LUE Coordination: decreased gross motor;decreased fine motor   Lower Extremity Assessment Lower Extremity Assessment: Defer to PT evaluation   Cervical / Trunk Assessment Cervical / Trunk Assessment: Kyphotic   Communication Communication Communication: No difficulties   Cognition Arousal/Alertness: Awake/alert Behavior During Therapy: WFL for tasks assessed/performed Overall Cognitive Status: History of cognitive impairments - at baseline (pt confused upon first awakening)       Memory: Decreased short-term memory             General Comments       Exercises       Shoulder Instructions      Home Living Family/patient expects to be discharged to:: Private residence Living Arrangements: Alone Available Help at Discharge: Family;Available PRN/intermittently (landlord, niece  and nephew) Type of Home: Apartment (garage apartment ) Home Access: Level entry     Home Layout: One level     Bathroom Shower/Tub: Other (comment) (sponge bathes due to LE wounds)     Bathroom Accessibility: Yes How Accessible: Accessible via wheelchair Home Equipment: Wheelchair - manual   Additional Comments: has HHRN 3 x a week to wrap her legs      Prior Functioning/Environment Level  of Independence: Independent with assistive device(s);Needs assistance  Gait / Transfers Assistance Needed: Patient is able to transfer into/out of w/c on her own.  Propels w/c with feet per patient. ADL's / Homemaking Assistance Needed: Patient takes sponge bath. Niece does housekeeping. [employer] and gets food "from [employer]            OT Problem List: Decreased strength;Decreased activity tolerance;Impaired balance (sitting and/or standing);Decreased range of motion;Decreased coordination;Decreased cognition;Decreased safety awareness;Obesity;Pain;Impaired UE functional use   OT Treatment/Interventions: Self-care/ADL training;Therapeutic exercise;DME and/or AE instruction;Patient/family education;Balance training;Therapeutic activities    OT Goals(Current goals can be found in the care plan section) Acute Rehab OT Goals Patient Stated Goal: To get home OT Goal Formulation: With patient Time For Goal Achievement: 02/21/16 Potential to Achieve Goals: Good ADL Goals Pt Will Perform Grooming: with supervision;sitting (seated at sink) Pt Will Perform Upper Body Bathing: with supervision;sitting (seated at sink) Pt Will Perform Lower Body Bathing: with supervision;sitting/lateral leans;with adaptive equipment Pt Will Perform Upper Body Dressing: with supervision;sitting Pt Will Perform Lower Body Dressing: with supervision;sitting/lateral leans;with adaptive equipment Pt Will Transfer to Toilet: with supervision;bedside commode (lateral scoot) Pt Will Perform Toileting - Clothing Manipulation and hygiene: with supervision;sitting/lateral leans Pt/caregiver will Perform Home Exercise Program: Both right and left upper extremity;With Supervision (AROM B UEs )  OT Frequency: Min 2X/week   Barriers to D/C: Decreased caregiver support          Co-evaluation              End of Session    Activity Tolerance: Patient limited by fatigue Patient left: in bed;with  call bell/phone within reach;with family/visitor present   Time: [id number] OT Time Calculation (min): 31 min Charges:  OT General Charges $OT Visit: 1 Procedure OT Evaluation $OT Eval Moderate Complexity: 1 Procedure OT Treatments $Self Care/Home Management : 8-22 mins G-Codes: OT G-codes **NOT FOR INPATIENT CLASS** Functional Assessment Tool Used: clinical judgement Functional Limitation: Self care Self Care Current Status [id number]): At least 60 percent but less than 80 percent impaired, limited or restricted Self Care Goal Status [id number]): At least 1 percent but less than 20 percent impaired, limited or restricted  Malka So 02/07/2016, 4:28 PM [phone]

## 2016-02-07 NOTE — Progress Notes (Signed)
PROGRESS NOTE    Jill Allen  [id number] DOB: Sep 07, 1915 DOA: 02/06/2016 PCP: [name], MD     Brief Narrative:  Jill Allen is a 82 y.o. female with medical history significant of HTN, HLD, venous stasis, DM 2, CKD, venous stasis ulcers, peripheral arterial disease. She presented with dizziness and weakness going on for past 2 days. She admits to poor PO intake, increased urinary frequency. She has long-standing history of venous stasis ulcers under wound care clinic management. She was admitted for treatment of UTI and trichomonas.   Assessment & Plan:   Active Problems:   Venous stasis ulcer of left lower extremity (HCC)   UTI (urinary tract infection)   Dehydration   Acute renal failure (ARF) (HCC)   CKD (chronic kidney disease), stage III   DM (diabetes mellitus), type 2 with renal complications (HCC)   Venous stasis  UTI, POA -First urine sample positive for trichomonas. Patient denies recent sexual activity, abuse. Repeat UA does not show trichomonas. First sample likely contaminant. Will treat for UTI only  -Diflucan x 1 dose given for yeast, flagyl x 1 dose given in ED  -Urine culture pending  -Rocephin   AKI on CKD 3  -Secondary to prerenal  -Baseline Cr 1.5 (in 2013, Care Everywhere)  -Renal US: no hydronephrosis  -Improving with IVF   DM type 2, with CKD stage 3 -Diet controlled at home -Ha1c pending  -SSI  Chronic venous stasis ulcer left LE -Follows with wound care clinic  -Wound consulted  -Monitor  Generalized weakness -Secondary to above -PT/OT - recommending CIR   HTN -Norvasc, metoprolol   HLD -Lipitor    DVT prophylaxis: lovenox  Code Status: DNR  Family Communication: at bedside Disposition Plan: CIR recommended. Spoke to family and patient at length regarding recommendation. She is not opposed to rehabilitation. However, she is worried that she will miss her court appointment on 11/6.   Consultants:    None  Procedures:   None  Antimicrobials:   Rocephin     Subjective: She states that she feels well today. All of her symptoms have now resolved. She denies any fevers, chest pain, cough, shortness of breath, nausea, vomiting, diarrhea, abdominal pain. Patient is tolerating meals   Objective: Vitals:   02/06/16 1900 02/06/16 2158 02/07/16 0501 02/07/16 1008  BP: 178/89 122/76 134/61 (!) 163/76  Pulse: 99 (!) 101 96 90  Resp: 15 16 16 18   Temp:  98 F (36.7 C) 98.1 F (36.7 C) 98.6 F (37 C)  TempSrc:  Oral Oral Oral  SpO2: 100% 98% 99% 100%  Weight:  87.5 kg (192 lb 14.4 oz)    Height:  5\' 5"  (1.651 m)      Intake/Output Summary (Last 24 hours) at 02/07/16 1509 Last data filed at 02/07/16 1400  Gross per 24 hour  Intake          1626.25 ml  Output              150 ml  Net          1476.25 ml   Filed Weights   02/06/16 2158  Weight: 87.5 kg (192 lb 14.4 oz)    Examination:  General exam: Appears calm and comfortable  Respiratory system: Clear to auscultation. Respiratory effort normal. Cardiovascular system: S1 & S2 heard, RRR. No JVD, murmurs, rubs, gallops or clicks. No pedal edema. Gastrointestinal system: Abdomen is nondistended, soft and nontender. No organomegaly or masses felt. Normal bowel sounds heard. Central  nervous system: Alert and oriented. No focal neurological deficits. Extremities: Symmetric 5 x 5 power. Skin: bilateral LE wounds wrapped, dressing clean and dry  Psychiatry: Judgement and insight appear normal. Mood & affect appropriate.   Data Reviewed: I have personally reviewed following labs and imaging studies  CBC:  Recent Labs Lab 02/06/16 1324 02/07/16 0545  WBC 7.6 7.2  HGB 13.0 12.3  HCT 38.9 37.4  MCV 87.8 87.4  PLT 258 [id number]   Basic Metabolic Panel:  Recent Labs Lab 02/06/16 1324 02/07/16 0545  NA 141 142  K 4.1 3.5  CL 107 110  CO2 23 21*  GLUCOSE 162* 154*  BUN 42* 34*  CREATININE 1.93* 1.67*  CALCIUM 9.4  8.8*  MG  --  1.9  PHOS  --  2.8   GFR: Estimated Creatinine Clearance: 20.5 mL/min (by C-G formula based on SCr of 1.67 mg/dL (H)). Liver Function Tests:  Recent Labs Lab 02/06/16 1436 02/07/16 0545  AST 31 28  ALT 10* 9*  ALKPHOS 74 65  BILITOT 0.7 0.5  PROT 7.9 7.2  ALBUMIN 3.5 3.1*   No results for input(s): LIPASE, AMYLASE in the last 168 hours. No results for input(s): AMMONIA in the last 168 hours. Coagulation Profile: No results for input(s): INR, PROTIME in the last 168 hours. Cardiac Enzymes: No results for input(s): CKTOTAL, CKMB, CKMBINDEX, TROPONINI in the last 168 hours. BNP (last 3 results) No results for input(s): PROBNP in the last 8760 hours. HbA1C: No results for input(s): HGBA1C in the last 72 hours. CBG:  Recent Labs Lab 02/07/16 0127 02/07/16 0810 02/07/16 1146  GLUCAP 214* 147* 191*   Lipid Profile: No results for input(s): CHOL, HDL, LDLCALC, TRIG, CHOLHDL, LDLDIRECT in the last 72 hours. Thyroid Function Tests:  Recent Labs  02/07/16 0545  TSH 0.395   Anemia Panel: No results for input(s): VITAMINB12, FOLATE, FERRITIN, TIBC, IRON, RETICCTPCT in the last 72 hours. Sepsis Labs: No results for input(s): PROCALCITON, LATICACIDVEN in the last 168 hours.  No results found for this or any previous visit (from the past 240 hour(s)).     Radiology Studies: US Renal  Result Date: 02/07/2016 CLINICAL DATA:  82 year old female with acute on chronic renal failure. History of diabetes and hypertension. EXAM: RENAL / URINARY TRACT ULTRASOUND COMPLETE COMPARISON:  None. FINDINGS: Right Kidney: Length: 9 cm. There is diffuse cortical thinning with mild increased echotexture. No hydronephrosis or echogenic stone. Left Kidney: Length: 9 cm. Diffuse cortical thinning with mild increased echogenicity. No hydronephrosis or echogenic stone. Bladder: Layering echogenic debris noted within the bladder. The urinary bladder is otherwise grossly unremarkable.  Bilateral ureteral jets noted. IMPRESSION: Moderate bilateral renal atrophy and cortical thinning, likely related to chronic kidney disease. No hydronephrosis or echogenic stone. Layering echogenic debris within the urinary bladder. Correlation with urinalysis recommended. Bilateral ureteral jets identified. Electronically Signed   By: Anner Crete M.D.   On: 02/07/2016 06:57   Dg Chest Portable 1 View  Result Date: 02/06/2016 CLINICAL DATA:  Dizziness.  Cough. EXAM: PORTABLE CHEST 1 VIEW COMPARISON:  No recent prior . FINDINGS: Mediastinum hilar structures are normal. Low lung volumes with mild basilar atelectasis. Heart size normal. Tiny left pleural effusion cannot be excluded. No pneumothorax. No acute bony abnormality. Degenerative changes both shoulders. IMPRESSION: Low lung volumes with mild basilar atelectasis. Tiny left pleural effusion cannot be excluded. Exam is otherwise unremarkable. Electronically Signed   By: Marcello Moores  Register   On: 02/06/2016 14:16  Scheduled Meds: . amLODipine  10 mg Oral Daily  . aspirin EC  81 mg Oral Daily  . atorvastatin  40 mg Oral Daily  . cefTRIAXone (ROCEPHIN)  IV  1 g Intravenous Q24H  . enoxaparin (LOVENOX) injection  30 mg Subcutaneous Q24H  . insulin aspart  0-5 Units Subcutaneous QHS  . insulin aspart  0-9 Units Subcutaneous TID WC  . metoprolol succinate  100 mg Oral Daily  . nystatin   Topical TID  . senna  1 tablet Oral BID   Continuous Infusions:     LOS: 0 days    Time spent: 40 minutes   Dessa Phi, DO [facility] [email or web address] Password [email or web address] 02/07/2016, 3:09 PM

## 2016-02-07 NOTE — Progress Notes (Signed)
Rehab Admissions Coordinator Note:  Patient was screened by Retta Diones for appropriateness for an Inpatient Acute Rehab Consult.  At this time, we are recommending Inpatient Rehab consult.  Retta Diones 02/07/2016, 12:36 PM  I can be reached at [phone].

## 2016-02-07 NOTE — Care Management Note (Signed)
Case Management Note  Patient Details  Name: Jill Allen MRN: [id number] Date of Birth: November 07, 1915  Subjective/Objective:         CM following for progression and d/c planning.           Action/Plan: 02/07/2016 Noted CM referral for [place] needs. Will follow and arrange [place] as ordered . Await orders and PT/OT eval.   Expected Discharge Date:                  Expected Discharge Plan:  [place]  In-House Referral:  Clinical Social Work  Discharge planning Services  CM Consult  Post Acute Care Choice:    Choice offered to:     DME Arranged:    DME Agency:     HH Arranged:    [place] Agency:     Status of Service:  In process, will continue to follow  If discussed at Long Length of Stay Meetings, dates discussed:    Additional Comments:  Adron Bene, RN 02/07/2016, 11:22 AM

## 2016-02-07 NOTE — Progress Notes (Signed)
Initial Nutrition Assessment  DOCUMENTATION CODES:   Obesity unspecified  INTERVENTION:  Provide Glucerna Shake po BID, each supplement provides 220 kcal and 10 grams of protein.  Encourage adequate PO intake.   NUTRITION DIAGNOSIS:   Increased nutrient needs related to acute illness as evidenced by estimated needs.  GOAL:   Patient will meet greater than or equal to 90% of their needs  MONITOR:   PO intake, Supplement acceptance, Labs, Weight trends, Skin, I & O's  REASON FOR ASSESSMENT:   Consult Assessment of nutrition requirement/status  ASSESSMENT:   82 y.o. female with medical history significant of HTN, HLD, venous stasis, DM 2, CKD, venous stasis ulcers, peripheral arterial disease  Pt reports having a good appetite currently and PTA with usual consumption of at least 3 meals day with a protein shake at least once daily. Weight has been stable. Diet recall includes toast and coffee for breakfast, a sandwich for lunch, and a hardy meal at dinner. Current meal completion has been 25-50%. Pt is agreeable to nutritional supplements to aid in caloric and protein needs. RD to order Glucerna. CBG's 147-214 mg/dL. Pt encouraged to eat her food at meals.   Pt with no observed significant fat or muscle mass loss.   Labs and medications reviewed.   Diet Order:  Diet regular Room service appropriate? Yes; Fluid consistency: Thin  Skin:  Wound (see comment) (wound on L hip)  Last BM:  10/24  Height:   Ht Readings from Last 1 Encounters:  02/06/16 5\' 5"  (1.651 m)    Weight:   Wt Readings from Last 1 Encounters:  02/06/16 192 lb 14.4 oz (87.5 kg)    Ideal Body Weight:  56.8 kg  BMI:  Body mass index is 32.1 kg/m.  Estimated Nutritional Needs:   Kcal:  1700-1800  Protein:  75-90 grams  Fluid:  1.7 - 1.8 L/day  EDUCATION NEEDS:   Education needs addressed  Corrin Parker, MS, RD, LDN Pager # [phone] After hours/ weekend pager # [phone]

## 2016-02-07 NOTE — Evaluation (Signed)
Physical Therapy Evaluation Patient Details Name: Jill Allen MRN: [id number] DOB: 08/27/1915 Today's Date: 02/07/2016   History of Present Illness  Patient is a 82 yo female admitted 02/06/16 with dizziness, weakness.  Patient with dehydration, UTI, acute on chronic renal failure.   PMH:  HTN, HLD, venous stasis with ulcers LE's, DM, CKD, PAD  Clinical Impression  Patient presents with problems listed below.  Will benefit from acute PT to maximize functional mobility prior to discharge.  Patient was independent at w/c level pta.  Today required mod-max assist for mobility.  Recommend Inpatient Rehab consult for comprehensive therapies with goal to return patient to highest functional level.    Follow Up Recommendations CIR;Supervision for mobility/OOB    Equipment Recommendations  None recommended by PT    Recommendations for Other Services Rehab consult     Precautions / Restrictions Precautions Precautions: Fall Restrictions Weight Bearing Restrictions: No      Mobility  Bed Mobility Overal bed mobility: Needs Assistance Bed Mobility: Supine to Sit;Sit to Supine     Supine to sit: Mod assist;HOB elevated Sit to supine: Mod assist   General bed mobility comments: Verbal cues for technique.  Assist to bring LE's off of bed, raise trunk to upright position, and scoot hips to EOB.  Patient able to maintain sitting balance with min guard assist x10 minutes.  Trunk flexed in sitting.  Returned to supine with assist to bring LE's onto bed, and to scoot to [facility].  Transfers                 General transfer comment: Patient declined to transfer into chair.  "I'm too weak"    Ambulation/Gait                Stairs            Wheelchair Mobility    Modified Rankin (Stroke Patients Only)       Balance Overall balance assessment: Needs assistance Sitting-balance support: Single extremity supported Sitting balance-Leahy Scale: Fair                                       Pertinent Vitals/Pain Pain Assessment: Faces Faces Pain Scale: Hurts a little bit ("Better since my pain medicine") Pain Location: LE's with contact Pain Descriptors / Indicators: Sore Pain Intervention(s): Limited activity within patient's tolerance;Monitored during session;Repositioned;Premedicated before session    Home Living Family/patient expects to be discharged to:: Private residence Living Arrangements: Alone Available Help at Discharge: Family;Friend(s);Available PRN/intermittently (Friend/landlord checks daily; Neice, great nephew) Type of Home: House (Per chart - garage of friend's house) Home Access: Level entry     Home Layout: One level Home Equipment: Wheelchair - manual      Prior Function Level of Independence: Independent with assistive device(s);Needs assistance   Gait / Transfers Assistance Needed: Patient is able to transfer into/out of w/c on her own.  Propels w/c with feet per patient.  ADL's / Homemaking Assistance Needed: Patient takes sponge baths on her own.   [employer] and gets food "from [employer]        Hand Dominance   Dominant Hand: Right    Extremity/Trunk Assessment   Upper Extremity Assessment: RUE deficits/detail;LUE deficits/detail RUE Deficits / Details: Decreased shoulder ROM to approx 45* flexion; Strength grossly 3-/5;  Decreased hand function     LUE Deficits / Details: Decreased shoulder ROM to approx 45* flexion;  Strength grossly 2+/5;  Decreased hand function   Lower Extremity Assessment: Generalized weakness (Unna boots bilaterally)      Cervical / Trunk Assessment: Kyphotic  Communication   Communication: No difficulties  Cognition Arousal/Alertness: Awake/alert Behavior During Therapy: Anxious Overall Cognitive Status: History of cognitive impairments - at baseline (Difficulty understanding info; perseverates on lawyer visit)       Memory: Decreased short-term  memory              General Comments      Exercises General Exercises - Lower Extremity Ankle Circles/Pumps: AROM;Both;10 reps;Seated Long Arc Quad: AROM;Both;5 reps;Seated   Assessment/Plan    PT Assessment Patient needs continued PT services  PT Problem List Decreased strength;Decreased range of motion;Decreased activity tolerance;Decreased balance;Decreased mobility;Decreased coordination;Decreased cognition;Decreased knowledge of use of DME;Decreased skin integrity;Pain          PT Treatment Interventions DME instruction;Functional mobility training;Therapeutic activities;Therapeutic exercise;Balance training;Cognitive remediation;Patient/family education;Wheelchair mobility training    PT Goals (Current goals can be found in the Care Plan section)  Acute Rehab PT Goals Patient Stated Goal: To get home PT Goal Formulation: With patient/family Time For Goal Achievement: 02/14/16 Potential to Achieve Goals: Good    Frequency Min 3X/week   Barriers to discharge Decreased caregiver support Patient lives alone    Co-evaluation               End of Session   Activity Tolerance: Patient limited by fatigue Patient left: in bed;with call bell/phone within reach;with bed alarm set;with family/visitor present Nurse Communication: Mobility status (Needs bed changed/cleaned)    Functional Assessment Tool Used: Clinical judgement Functional Limitation: Mobility: Walking and moving around Mobility: Walking and Moving Around Current Status [id number]): At least 40 percent but less than 60 percent impaired, limited or restricted Mobility: Walking and Moving Around Goal Status [phone]): At least 1 percent but less than 20 percent impaired, limited or restricted    Time: [id number] PT Time Calculation (min) (ACUTE ONLY): 31 min   Charges:   PT Evaluation $PT Eval Moderate Complexity: 1 Procedure PT Treatments $Therapeutic Activity: 8-22 mins   PT G Codes:   PT G-Codes  **NOT FOR INPATIENT CLASS** Functional Assessment Tool Used: Clinical judgement Functional Limitation: Mobility: Walking and moving around Mobility: Walking and Moving Around Current Status [id number]): At least 40 percent but less than 60 percent impaired, limited or restricted Mobility: Walking and Moving Around Goal Status [phone]): At least 1 percent but less than 20 percent impaired, limited or restricted    Despina Pole 02/07/2016, 12:32 PM Carita Pian. Sanjuana Kava, [place] Pager [phone]

## 2016-02-07 NOTE — Consult Note (Signed)
Physical Medicine and Rehabilitation Consult  Reason for Consult:  Dehydration with UTI Referring Physician: Dr. Maylene Roes   HPI: Jill Allen is a 82 y.o. female with history of T2DM, stasis ulcers, HTN who was admitted yesterday evening with 2 day history of weakness with poor po intake, dizziness, urinary frequency and inability to transfer from her wheel chair. She was found to have acute on chronic renal failure with evidence of UTI and candida in urine.  She was  on ceftriaxone and IVF for hydration.  BLE unna boots changed by WOC. PT evaluation done today and CIR recommended for follow up therapy.   Patient rents from a friend who checks on her before he leaves home and when he gets back.  She is wheelchair bound and does level transfers. Hasn't stood for years. Sponge bathes. Has meals on wheels and niece who helps with housework.      Review of Systems  HENT: Negative for hearing loss.   Eyes: Negative for blurred vision and double vision.  Respiratory: Positive for cough. Negative for shortness of breath.   Cardiovascular: Negative for chest pain and palpitations.  Gastrointestinal: Negative for abdominal pain and nausea.  Genitourinary: Negative for dysuria and urgency.  Musculoskeletal: Positive for joint pain (left shoulder).  Skin: Negative for rash.  Neurological: Positive for weakness. Negative for dizziness and headaches.  Psychiatric/Behavioral: Positive for memory loss.      Past Medical History:  Diagnosis Date  . Diabetes ([place])    SLOW TO HEAL  . Gout   . HBP (high blood pressure)   . Kidney disease   . Poor circulation     Past Surgical History:  Procedure Laterality Date  . APPENDECTOMY    . CATARACT EXTRACTION Bilateral     Family History  Problem Relation Age of Onset  . Diabetes Mother   . Hypertension Brother   . Cancer Neg Hx     Social History:  Widowed. She lives alone. Per reports that she quit smoking about 43 years ago. Her  smoking use included Cigarettes. She has never used smokeless tobacco. She reports that she does not drink alcohol or use drugs.    Allergies: No Known Allergies    Medications Prior to Admission  Medication Sig Dispense Refill  . acetaminophen (TYLENOL) 500 MG tablet Take 500 mg by mouth every 8 (eight) hours as needed for moderate pain.    Marland Kitchen amLODipine (NORVASC) 10 MG tablet Take 10 mg by mouth daily.     Marland Kitchen aspirin EC 81 MG tablet Take 81 mg by mouth daily.    Marland Kitchen atorvastatin (LIPITOR) 40 MG tablet Take 40 mg by mouth daily.     Marland Kitchen lisinopril (PRINIVIL,ZESTRIL) 20 MG tablet Take 20 mg by mouth daily.    . metoprolol succinate (TOPROL-XL) 100 MG 24 hr tablet Take 100 mg by mouth daily. Take with or immediately following a meal.      Home: Home Living Family/patient expects to be discharged to:: Private residence Living Arrangements: Alone Available Help at Discharge: Family, Friend(s), Available PRN/intermittently (Friend/landlord checks daily; Neice, great nephew) Type of Home: House (Per chart - garage of friend's house) Home Access: Level entry Home Layout: One level Bathroom Accessibility: Yes Home Equipment: Wheelchair - manual  Functional History: Prior Function Level of Independence: Independent with assistive device(s), Needs assistance Gait / Transfers Assistance Needed: Patient is able to transfer into/out of w/c on her own.  Propels w/c with feet per patient. ADL's /  Homemaking Assistance Needed: Patient takes sponge baths on her own.   [employer] and gets food "from [employer] Functional Status:  Mobility: Bed Mobility Overal bed mobility: Needs Assistance Bed Mobility: Supine to Sit, Sit to Supine Supine to sit: Mod assist, HOB elevated Sit to supine: Mod assist General bed mobility comments: Verbal cues for technique.  Assist to bring LE's off of bed, raise trunk to upright position, and scoot hips to EOB.  Patient able to maintain sitting balance  with min guard assist x10 minutes.  Trunk flexed in sitting.  Returned to supine with assist to bring LE's onto bed, and to scoot to [facility]. Transfers General transfer comment: Patient declined to transfer into chair.  "I'm too weak"        ADL:    Cognition: Cognition Overall Cognitive Status: History of cognitive impairments - at baseline (Difficulty understanding info; perseverates on lawyer visit) Orientation Level: Oriented X4 Cognition Arousal/Alertness: Awake/alert Behavior During Therapy: Anxious Overall Cognitive Status: History of cognitive impairments - at baseline (Difficulty understanding info; perseverates on lawyer visit) Memory: Decreased short-term memory   Blood pressure (!) 163/76, pulse 90, temperature 98.6 F (37 C), temperature source Oral, resp. rate 18, height 5\' 5"  (1.651 m), weight 87.5 kg (192 lb 14.4 oz), SpO2 100 %. Physical Exam  Nursing note and vitals reviewed. Constitutional: She is oriented to person, place, and time. She appears well-developed and well-nourished.  HENT:  Head: Normocephalic.  Eyes: Conjunctivae are normal. Pupils are equal, round, and reactive to light.  Neck: Normal range of motion. Neck supple.  Cardiovascular: Normal rate and regular rhythm.   Respiratory: Effort normal and breath sounds normal. No stridor. No respiratory distress. She has no wheezes.  GI: Soft. Bowel sounds are normal. She exhibits no distension. There is no tenderness.  Musculoskeletal: She exhibits edema.  Unna boots on BLE.   Neurological: She is alert and oriented to person, place, and time.  Mild dysarthria. Able to follow basic commands without difficulty.  Decreased ROM LUE with discomfort on movement.  Skin: Skin is warm and dry.  Psychiatric: She has a normal mood and affect. Her speech is tangential. She is slowed. She expresses inappropriate judgment.    Results for orders placed or performed during the hospital encounter of 02/06/16 (from the  past 24 hour(s))  Hepatic function panel     Status: Abnormal   Collection Time: 02/06/16  2:36 PM  Result Value Ref Range   Total Protein 7.9 6.5 - 8.1 g/dL   Albumin 3.5 3.5 - 5.0 g/dL   AST 31 15 - 41 U/L   ALT 10 (L) 14 - 54 U/L   Alkaline Phosphatase 74 38 - 126 U/L   Total Bilirubin 0.7 0.3 - 1.2 mg/dL   Bilirubin, Direct 0.2 0.1 - 0.5 mg/dL   Indirect Bilirubin 0.5 0.3 - 0.9 mg/dL  Urinalysis, Routine w reflex microscopic     Status: Abnormal   Collection Time: 02/06/16  6:10 PM  Result Value Ref Range   Color, Urine YELLOW YELLOW   APPearance CLOUDY (A) CLEAR   Specific Gravity, Urine 1.021 1.005 - 1.030   pH 6.0 5.0 - 8.0   Glucose, UA NEGATIVE NEGATIVE mg/dL   Hgb urine dipstick MODERATE (A) NEGATIVE   Bilirubin Urine NEGATIVE NEGATIVE   Ketones, ur NEGATIVE NEGATIVE mg/dL   Protein, ur 100 (A) NEGATIVE mg/dL   Nitrite NEGATIVE NEGATIVE   Leukocytes, UA LARGE (A) NEGATIVE  Urine microscopic-add on  Status: Abnormal   Collection Time: 02/06/16  6:10 PM  Result Value Ref Range   Squamous Epithelial / LPF 0-5 (A) NONE SEEN   WBC, UA TOO NUMEROUS TO COUNT 0 - 5 WBC/hpf   RBC / HPF 0-5 0 - 5 RBC/hpf   Bacteria, UA FEW (A) NONE SEEN   Urine-Other YEAST PRESENT   Urinalysis, Routine w reflex microscopic (not at [facility])     Status: Abnormal   Collection Time: 02/06/16  9:00 PM  Result Value Ref Range   Color, Urine YELLOW YELLOW   APPearance HAZY (A) CLEAR   Specific Gravity, Urine 1.017 1.005 - 1.030   pH 6.0 5.0 - 8.0   Glucose, UA NEGATIVE NEGATIVE mg/dL   Hgb urine dipstick MODERATE (A) NEGATIVE   Bilirubin Urine NEGATIVE NEGATIVE   Ketones, ur NEGATIVE NEGATIVE mg/dL   Protein, ur 100 (A) NEGATIVE mg/dL   Nitrite NEGATIVE NEGATIVE   Leukocytes, UA MODERATE (A) NEGATIVE  Urine microscopic-add on     Status: Abnormal   Collection Time: 02/06/16  9:00 PM  Result Value Ref Range   Squamous Epithelial / LPF 0-5 (A) NONE SEEN   WBC, UA TOO NUMEROUS TO COUNT 0 - 5  WBC/hpf   RBC / HPF 0-5 0 - 5 RBC/hpf   Bacteria, UA FEW (A) NONE SEEN   Urine-Other YEAST PRESENT   Glucose, capillary     Status: Abnormal   Collection Time: 02/07/16  1:27 AM  Result Value Ref Range   Glucose-Capillary 214 (H) 65 - 99 mg/dL  Magnesium     Status: None   Collection Time: 02/07/16  5:45 AM  Result Value Ref Range   Magnesium 1.9 1.7 - 2.4 mg/dL  Phosphorus     Status: None   Collection Time: 02/07/16  5:45 AM  Result Value Ref Range   Phosphorus 2.8 2.5 - 4.6 mg/dL  TSH     Status: None   Collection Time: 02/07/16  5:45 AM  Result Value Ref Range   TSH 0.395 0.350 - 4.500 uIU/mL  Comprehensive metabolic panel     Status: Abnormal   Collection Time: 02/07/16  5:45 AM  Result Value Ref Range   Sodium 142 135 - 145 mmol/L   Potassium 3.5 3.5 - 5.1 mmol/L   Chloride 110 101 - 111 mmol/L   CO2 21 (L) 22 - 32 mmol/L   Glucose, Bld 154 (H) 65 - 99 mg/dL   BUN 34 (H) 6 - 20 mg/dL   Creatinine, Ser 1.67 (H) 0.44 - 1.00 mg/dL   Calcium 8.8 (L) 8.9 - 10.3 mg/dL   Total Protein 7.2 6.5 - 8.1 g/dL   Albumin 3.1 (L) 3.5 - 5.0 g/dL   AST 28 15 - 41 U/L   ALT 9 (L) 14 - 54 U/L   Alkaline Phosphatase 65 38 - 126 U/L   Total Bilirubin 0.5 0.3 - 1.2 mg/dL   GFR calc non Af Amer 24 (L) >60 mL/min   GFR calc Af Amer 28 (L) >60 mL/min   Anion gap 11 5 - 15  CBC     Status: Abnormal   Collection Time: 02/07/16  5:45 AM  Result Value Ref Range   WBC 7.2 4.0 - 10.5 K/uL   RBC 4.28 3.87 - 5.11 MIL/uL   Hemoglobin 12.3 12.0 - 15.0 g/dL   HCT 37.4 36.0 - 46.0 %   MCV 87.4 78.0 - 100.0 fL   MCH 28.7 26.0 - 34.0 pg  MCHC 32.9 30.0 - 36.0 g/dL   RDW 16.1 (H) 11.5 - 15.5 %   Platelets 244 150 - 400 K/uL  Prealbumin     Status: Abnormal   Collection Time: 02/07/16  5:45 AM  Result Value Ref Range   Prealbumin 15.9 (L) 18 - 38 mg/dL  Glucose, capillary     Status: Abnormal   Collection Time: 02/07/16  8:10 AM  Result Value Ref Range   Glucose-Capillary 147 (H) 65 - 99  mg/dL  Glucose, capillary     Status: Abnormal   Collection Time: 02/07/16 11:46 AM  Result Value Ref Range   Glucose-Capillary 191 (H) 65 - 99 mg/dL   US Renal  Result Date: 02/07/2016 CLINICAL DATA:  81 year old female with acute on chronic renal failure. History of diabetes and hypertension. EXAM: RENAL / URINARY TRACT ULTRASOUND COMPLETE COMPARISON:  None. FINDINGS: Right Kidney: Length: 9 cm. There is diffuse cortical thinning with mild increased echotexture. No hydronephrosis or echogenic stone. Left Kidney: Length: 9 cm. Diffuse cortical thinning with mild increased echogenicity. No hydronephrosis or echogenic stone. Bladder: Layering echogenic debris noted within the bladder. The urinary bladder is otherwise grossly unremarkable. Bilateral ureteral jets noted. IMPRESSION: Moderate bilateral renal atrophy and cortical thinning, likely related to chronic kidney disease. No hydronephrosis or echogenic stone. Layering echogenic debris within the urinary bladder. Correlation with urinalysis recommended. Bilateral ureteral jets identified. Electronically Signed   By: Anner Crete M.D.   On: 02/07/2016 06:57   Dg Chest Portable 1 View  Result Date: 02/06/2016 CLINICAL DATA:  Dizziness.  Cough. EXAM: PORTABLE CHEST 1 VIEW COMPARISON:  No recent prior . FINDINGS: Mediastinum hilar structures are normal. Low lung volumes with mild basilar atelectasis. Heart size normal. Tiny left pleural effusion cannot be excluded. No pneumothorax. No acute bony abnormality. Degenerative changes both shoulders. IMPRESSION: Low lung volumes with mild basilar atelectasis. Tiny left pleural effusion cannot be excluded. Exam is otherwise unremarkable. Electronically Signed   By: Marcello Moores  Register   On: 02/06/2016 14:16    Assessment/Plan: Diagnosis: 82 year old female admitted with dehydration and UTI on Wednesday.     RECOMMENDATIONS: This patient's condition is appropriate for continued rehabilitative care in  the following setting: potentially home vs SNF Patient has agreed to participate in recommended program. Potentially Note that insurance prior authorization may be required for reimbursement for recommended care.  Comment:  Has no medical necessity for CIR. If pt is unsafe to return home when medically stable, then she may need short term SNF vs longer term plan for safer living arrangements such as ILF/ALF.  Meredith Staggers, MD, [place] [facility] 02/08/2016

## 2016-02-07 NOTE — Consult Note (Addendum)
[facility] Nurse wound consult note Reason for Consult:Bilateral Unnas boots; pt states she wears them prior to admission and they are changed 3 times a week. Wound type:Venous Stasis ulcer to lt lateral dorsal foot Pressure Ulcer POA: n/a Measurement:2x1x0.1 Wound EI:[id number] slough  Drainage (amount, consistency, odor) small amount serous fluid Periwound:Dry, scaly, flaky skin to bilateral legs and feet Dressing procedure/placement/frequency:Apply foam border to wound to lt lateral dorsal foot, change every 3 days. Or prn for soilage Paged Ortho tech to apply Unna boots to BLE today, then begin M/W/F next week.   Patient states she has home health that comes out on Monday Wednesday and Fridays for dressing changes.   Lenard Simmer WOC student Please re-consult if further assistance is needed.  Thank-you,  Julien Girt MSN, [place], [place], [place], [place]

## 2016-02-08 LAB — URINE CULTURE

## 2016-02-08 LAB — GLUCOSE, CAPILLARY
GLUCOSE-CAPILLARY: 102 mg/dL — AB (ref 65–99)
GLUCOSE-CAPILLARY: 141 mg/dL — AB (ref 65–99)
GLUCOSE-CAPILLARY: 204 mg/dL — AB (ref 65–99)
GLUCOSE-CAPILLARY: 184 mg/dL — AB (ref 65–99)

## 2016-02-08 LAB — BASIC METABOLIC PANEL
ANION GAP: 8 (ref 5–15)
ANION GAP: 7 (ref 5–15)
BUN: 30 mg/dL — AB (ref 6–20)
BUN: 30 mg/dL — ABNORMAL HIGH (ref 6–20)
CALCIUM: 8.2 mg/dL — AB (ref 8.9–10.3)
CHLORIDE: 110 mmol/L (ref 101–111)
CO2: 23 mmol/L (ref 22–32)
CO2: 24 mmol/L (ref 22–32)
CREATININE: 1.69 mg/dL — AB (ref 0.44–1.00)
Calcium: 8.4 mg/dL — ABNORMAL LOW (ref 8.9–10.3)
Chloride: 109 mmol/L (ref 101–111)
Creatinine, Ser: 1.86 mg/dL — ABNORMAL HIGH (ref 0.44–1.00)
GFR calc Af Amer: 25 mL/min — ABNORMAL LOW (ref >60)
GFR, EST AFRICAN AMERICAN: 28 mL/min — AB (ref >60)
GFR, EST NON AFRICAN AMERICAN: 21 mL/min — AB (ref >60)
GFR, EST NON AFRICAN AMERICAN: 24 mL/min — AB (ref >60)
Glucose, Bld: 102 mg/dL — ABNORMAL HIGH (ref 65–99)
Glucose, Bld: 199 mg/dL — ABNORMAL HIGH (ref 65–99)
POTASSIUM: 3.6 mmol/L (ref 3.5–5.1)
Potassium: 3.4 mmol/L — ABNORMAL LOW (ref 3.5–5.1)
SODIUM: 141 mmol/L (ref 135–145)
SODIUM: 140 mmol/L (ref 135–145)

## 2016-02-08 LAB — HEMOGLOBIN A1C
HEMOGLOBIN A1C: 7.4 % — AB (ref 4.8–5.6)
Mean Plasma Glucose: 166 mg/dL

## 2016-02-08 MED ORDER — SODIUM CHLORIDE 0.9 % IV SOLN
INTRAVENOUS | Status: DC
  Administered 2016-02-08: 950 mL via INTRAVENOUS

## 2016-02-08 MED ORDER — CEPHALEXIN 500 MG PO CAPS: 500.0000 mg | ORAL_CAPSULE | Freq: Two times a day (BID) | ORAL | Status: DC

## 2016-02-08 NOTE — Progress Notes (Addendum)
Inpatient Rehabilitation  Note that patient has been changed to inpatient status.  I met with patient to discuss team's recommendations.  Patient declined to make any decisions about rehab until she has talked to her lawyer. Per rehab MD no medical necessity and we will sign off at this time.  I notified CSW and RNCM.  Please call with questions.   Carmelia Roller., CCC/SLP Admission Coordinator  [place]  Cell [phone]

## 2016-02-08 NOTE — Progress Notes (Signed)
PROGRESS NOTE    Jill Allen  [id number] DOB: 07/07/1915 DOA: 02/06/2016 PCP: [name], MD     Brief Narrative:  Jill Allen is a 82 y.o. female with medical history significant of HTN, HLD, venous stasis, DM 2, CKD, venous stasis ulcers, peripheral arterial disease. She presented with dizziness and weakness going on for past 2 days. She admits to poor PO intake, increased urinary frequency. She has long-standing history of venous stasis ulcers under wound care clinic management. She was admitted for treatment of UTI and trichomonas.   Assessment & Plan:   Active Problems:   Venous stasis ulcer of left lower extremity (HCC)   UTI (urinary tract infection)   Dehydration   Acute renal failure (ARF) (HCC)   CKD (chronic kidney disease), stage III   DM (diabetes mellitus), type 2 with renal complications (HCC)   Venous stasis  AKI on CKD 3  -Secondary to prerenal  -Baseline Cr 1.5 (in 2013, Care Everywhere)  -Renal US: no hydronephrosis  -IVF was stopped yesterday and Cr slightly worse today. Restart IVF  Vaginal yeast infection  -First urine sample positive for trichomonas. Patient denies recent sexual activity, abuse. Repeat UA does not show trichomonas. Flagyl x 1 dose given in ED. First sample likely contaminant. Urine culture negative for bacteria, positive for yeast. Diflucan x 1 dose given for yeast. Stop antibiotics today.   DM type 2, with CKD stage 3 -Diet controlled at home -Ha1c 7.4 -SSI  Chronic venous stasis ulcer left LE -Follows with wound care clinic  -Wound consulted  -Monitor  Generalized weakness -Secondary to above -PT/OT - recommending CIR   HTN -Norvasc, metoprolol   HLD -Lipitor    DVT prophylaxis: lovenox  Code Status: DNR  Family Communication: at bedside Disposition Plan: CIR recommended   Consultants:   None  Procedures:   None  Antimicrobials:   Rocephin     Subjective: Doing well. No complaints. Patient  is tolerating meals.    Objective: Vitals:   02/07/16 1410 02/07/16 2017 02/08/16 0007 02/08/16 0542  BP: 135/65 (!) 150/63 (!) 152/70 (!) 141/57  Pulse: 95 75 70 64  Resp: 17 19 18 20   Temp: 98.5 F (36.9 C) 97.7 F (36.5 C) 98.5 F (36.9 C) 99 F (37.2 C)  TempSrc: Oral Oral Oral   SpO2: 100% 98% 98% 97%  Weight:  90.4 kg (199 lb 4.7 oz)    Height:        Intake/Output Summary (Last 24 hours) at 02/08/16 0757 Last data filed at 02/08/16 0200  Gross per 24 hour  Intake              890 ml  Output                1 ml  Net              889 ml   Filed Weights   02/06/16 2158 02/07/16 2017  Weight: 87.5 kg (192 lb 14.4 oz) 90.4 kg (199 lb 4.7 oz)    Examination:  General exam: Appears calm and comfortable  Respiratory system: Clear to auscultation. Respiratory effort normal. Cardiovascular system: S1 & S2 heard, RRR. No JVD, murmurs, rubs, gallops or clicks. No pedal edema. Gastrointestinal system: Abdomen is nondistended, soft and nontender. No organomegaly or masses felt. Normal bowel sounds heard. Central nervous system: Alert and oriented. No focal neurological deficits. Extremities: Symmetric 5 x 5 power. Skin: bilateral LE wounds wrapped, dressing clean and dry  Psychiatry:  Judgement and insight appear normal. Mood & affect appropriate.   Data Reviewed: I have personally reviewed following labs and imaging studies  CBC:  Recent Labs Lab 02/06/16 1324 02/07/16 0545  WBC 7.6 7.2  HGB 13.0 12.3  HCT 38.9 37.4  MCV 87.8 87.4  PLT 258 [id number]   Basic Metabolic Panel:  Recent Labs Lab 02/06/16 1324 02/07/16 0545 02/08/16 0534  NA 141 142 141  K 4.1 3.5 3.6  CL 107 110 110  CO2 23 21* 23  GLUCOSE 162* 154* 102*  BUN 42* 34* 30*  CREATININE 1.93* 1.67* 1.86*  CALCIUM 9.4 8.8* 8.4*  MG  --  1.9  --   PHOS  --  2.8  --    GFR: Estimated Creatinine Clearance: 18.8 mL/min (by C-G formula based on SCr of 1.86 mg/dL (H)). Liver Function Tests:  Recent  Labs Lab 02/06/16 1436 02/07/16 0545  AST 31 28  ALT 10* 9*  ALKPHOS 74 65  BILITOT 0.7 0.5  PROT 7.9 7.2  ALBUMIN 3.5 3.1*   No results for input(s): LIPASE, AMYLASE in the last 168 hours. No results for input(s): AMMONIA in the last 168 hours. Coagulation Profile: No results for input(s): INR, PROTIME in the last 168 hours. Cardiac Enzymes: No results for input(s): CKTOTAL, CKMB, CKMBINDEX, TROPONINI in the last 168 hours. BNP (last 3 results) No results for input(s): PROBNP in the last 8760 hours. HbA1C:  Recent Labs  02/07/16 0545  HGBA1C 7.4*   CBG:  Recent Labs Lab 02/07/16 0127 02/07/16 0810 02/07/16 1146 02/07/16 1642 02/07/16 2017  GLUCAP 214* 147* 191* 164* 183*   Lipid Profile: No results for input(s): CHOL, HDL, LDLCALC, TRIG, CHOLHDL, LDLDIRECT in the last 72 hours. Thyroid Function Tests:  Recent Labs  02/07/16 0545  TSH 0.395   Anemia Panel: No results for input(s): VITAMINB12, FOLATE, FERRITIN, TIBC, IRON, RETICCTPCT in the last 72 hours. Sepsis Labs: No results for input(s): PROCALCITON, LATICACIDVEN in the last 168 hours.  No results found for this or any previous visit (from the past 240 hour(s)).     Radiology Studies: US Renal  Result Date: 02/07/2016 CLINICAL DATA:  82 year old female with acute on chronic renal failure. History of diabetes and hypertension. EXAM: RENAL / URINARY TRACT ULTRASOUND COMPLETE COMPARISON:  None. FINDINGS: Right Kidney: Length: 9 cm. There is diffuse cortical thinning with mild increased echotexture. No hydronephrosis or echogenic stone. Left Kidney: Length: 9 cm. Diffuse cortical thinning with mild increased echogenicity. No hydronephrosis or echogenic stone. Bladder: Layering echogenic debris noted within the bladder. The urinary bladder is otherwise grossly unremarkable. Bilateral ureteral jets noted. IMPRESSION: Moderate bilateral renal atrophy and cortical thinning, likely related to chronic kidney  disease. No hydronephrosis or echogenic stone. Layering echogenic debris within the urinary bladder. Correlation with urinalysis recommended. Bilateral ureteral jets identified. Electronically Signed   By: Anner Crete M.D.   On: 02/07/2016 06:57   Dg Chest Portable 1 View  Result Date: 02/06/2016 CLINICAL DATA:  Dizziness.  Cough. EXAM: PORTABLE CHEST 1 VIEW COMPARISON:  No recent prior . FINDINGS: Mediastinum hilar structures are normal. Low lung volumes with mild basilar atelectasis. Heart size normal. Tiny left pleural effusion cannot be excluded. No pneumothorax. No acute bony abnormality. Degenerative changes both shoulders. IMPRESSION: Low lung volumes with mild basilar atelectasis. Tiny left pleural effusion cannot be excluded. Exam is otherwise unremarkable. Electronically Signed   By: [place]   On: 02/06/2016 14:16      Scheduled Meds: .  amLODipine  10 mg Oral Daily  . aspirin EC  81 mg Oral Daily  . atorvastatin  40 mg Oral Daily  . cefTRIAXone (ROCEPHIN)  IV  1 g Intravenous Q24H  . enoxaparin (LOVENOX) injection  30 mg Subcutaneous Q24H  . feeding supplement (GLUCERNA SHAKE)  237 mL Oral BID BM  . insulin aspart  0-5 Units Subcutaneous QHS  . insulin aspart  0-9 Units Subcutaneous TID WC  . metoprolol succinate  100 mg Oral Daily  . nystatin   Topical TID  . senna  1 tablet Oral BID   Continuous Infusions: . sodium chloride       LOS: 1 day    Time spent: 30 minutes   Dessa Phi, DO [facility] [email or web address] Password [email or web address] 02/08/2016, 7:57 AM

## 2016-02-08 NOTE — Progress Notes (Signed)
PT Cancellation Note  Patient Details Name: Jill Allen MRN: [id number] DOB: [birth year]   Cancelled Treatment:    Reason Eval/Treat Not Completed: Fatigue/lethargy limiting ability to participate;Patient declined, no reason specified.  PT providing max encouragement to work on mobility.  "I can't do it now"  "I can do it at home".  Patient declines to sit EOB or transfer to chair.  NT reports +2 assist to roll.  Patient lives alone and functioned at w/c level pta.  At this point, patient unable to move to EOB without mod to max assist. Patient did not qualify for CIR stay.  Feel patient will need SNF stay at d/c for continued therapy to reach Mod I at w/c level for safe return home.   Despina Pole 02/08/2016, 4:46 PM Carita Pian. Sanjuana Kava, [place] Pager [phone]

## 2016-02-08 NOTE — Care Management Note (Signed)
Case Management Note  Patient Details  Name: Jill Allen MRN: [id number] Date of Birth: 19-Oct-1915  Subjective/Objective:   CM following for progression and d/c planning.                 Action/Plan: 02/08/2016 Pt active with [facility] for [place] will resume and asked for the addition of HHRN . Pt will d/c to home and per pt and family she has walker, wheelchair and 3:1, no further DME needs. [facility] Notified of plan to d/c this pt.   Expected Discharge Date:       02/08/2016           Expected Discharge Plan:  [place]  In-House Referral:  Clinical Social Work, NA  Discharge planning Services  CM Consult  Post Acute Care Choice:  Home Health Choice offered to:  Patient  DME Arranged:   NA DME Agency:   NA  HH Arranged:  PT, RN [place] Agency:  [place] (now [facility])  Status of Service:  Completed, signed off  If discussed at [employer] of Stay Meetings, dates discussed:    Additional Comments:  Adron Bene, RN 02/08/2016, 3:42 PM

## 2016-02-09 DIAGNOSIS — I83029 Varicose veins of left lower extremity with ulcer of unspecified site: Secondary | ICD-10-CM

## 2016-02-09 LAB — BASIC METABOLIC PANEL
Anion gap: 7 (ref 5–15)
BUN: 26 mg/dL — AB (ref 6–20)
CALCIUM: 8.2 mg/dL — AB (ref 8.9–10.3)
CHLORIDE: 112 mmol/L — AB (ref 101–111)
CO2: 23 mmol/L (ref 22–32)
CREATININE: 1.53 mg/dL — AB (ref 0.44–1.00)
GFR, EST AFRICAN AMERICAN: 31 mL/min — AB (ref >60)
GFR, EST NON AFRICAN AMERICAN: 27 mL/min — AB (ref >60)
Glucose, Bld: 140 mg/dL — ABNORMAL HIGH (ref 65–99)
Potassium: 3.6 mmol/L (ref 3.5–5.1)
SODIUM: 142 mmol/L (ref 135–145)

## 2016-02-09 LAB — GLUCOSE, CAPILLARY
GLUCOSE-CAPILLARY: 139 mg/dL — AB (ref 65–99)
Glucose-Capillary: 154 mg/dL — ABNORMAL HIGH (ref 65–99)

## 2016-02-09 MED ORDER — HYDRALAZINE HCL 20 MG/ML IJ SOLN: 10.0000 mg | INTRAMUSCULAR | Status: DC | PRN

## 2016-02-09 NOTE — Clinical Social Work Note (Signed)
CSW reviewed chart and spoke with Judson Roch- weekend [facility]. Patient will return home with [facility] when medically stable. SNF placement is not desired for patient.  CSW services will sign off.  Lorie Phenix. Pauline Good, [place]  (weekend coverage)

## 2016-02-09 NOTE — Progress Notes (Addendum)
16:00 CM received callback and [facility] is able to see pt 02/10/16; CM has called Dr. Maylene Roes to relay [facility] will  be 02/10/16 if discharged today.    CM called [facility] rep, Clide Cliff to ensure [facility] will be seeing pt tomorrow 02/10/16; Cm waiting for callback from [facility] who is checking on staffing.

## 2016-02-09 NOTE — Care Management (Signed)
CM spoke with patient regarding DME patient will benefit from a bedside commode, 3n1 delivered to room prior to discharge.

## 2016-02-09 NOTE — Progress Notes (Signed)
Patient discharge teaching given, including activity, diet, follow-up appoints, and medications. Patient verbalized understanding of all discharge instructions. IV access was d/c'd. Vitals are stable. Skin is intact except as charted in most recent assessments. Pt to be escorted out by NT, to be driven home by family.  Anikka Marsan, MBA, BSN, RN 

## 2016-02-09 NOTE — Discharge Summary (Addendum)
Physician Discharge Summary  Jill Allen [id number] DOB: 22-Jan-1896 DOA: 02/06/2016  PCP: [name], MD  Admit date: 02/06/2016 Discharge date: 02/09/2016  Admitted From: Home Disposition:  Home (refused CIR, refused SNF although both are recommended)  Recommendations for Outpatient Follow-up:  1. Follow up with PCP in 1 week 2. Please obtain BMP in 1 week   Home Health: HHPT, [facility]N (they will follow up 10/29)  Equipment/Devices: Has walker, wheelchair, 3:1 at home     Discharge Condition: Stable, but at high risk for readmission as patient quite weak and needs max assistance. Refused CIR and SNF multiple times. Benefit and risk explained to patient multiple times and she continued to refuse, stating "if I have trouble at home, I'll come back." CODE STATUS: DNR  Diet recommendation: General   Brief/Interim Summary: From H&P: Jill Allen a 82 y.o.femalewith medical history significant of HTN, HLD, venous stasis, DM 2, CKD, venous stasis ulcers, peripheral arterial disease. She presented with dizziness and weakness going on for past 2 days. She admits to poor PO intake, increased urinary frequency. She has long-standing history of venous stasis ulcers under wound care clinic management. She was admitted for treatment of UTI and trichomonas.   Interim: She was initially treated for UTI, this was discontinued as urine culture was negative for bacteria. Patient was treated for yeast infection with 1 time dose of Diflucan. She was given IV fluids for AKI, which improved. She was evaluated by physical therapy during admission. They did recommend CIR. Patient refused stating that she needs to go home for a meeting with her lawyer on 11/6. I did discuss this with patient as well as family member in room extensively on 10/26. She continued to refuse stating that she needs to make this court appointment next week. AKI continued to improve. We also recommended skilled nursing facility.  Patient also refused this and stated that she will go home with home health PT and home health Rn.   Subjective on day of discharge: No new complaints. Doing well. I did speak to her nephew over the phone today [phone]) with patient's permission and updated him on patient's status, refusal of placement, and discharge.    Discharge Diagnoses:  Active Problems:   Venous stasis ulcer of left lower extremity (HCC)   UTI (urinary tract infection)   Dehydration   Acute renal failure (ARF) (HCC)   CKD (chronic kidney disease), stage III   DM (diabetes mellitus), type 2 with renal complications (HCC)   Venous stasis  AKI on CKD 3  -Secondary to prerenal, dehydration  -Baseline Cr 1.5 (in 2013, Care Everywhere)  -Renal US: no hydronephrosis  -Lisinopril stopped  -Improved with IVF   Vaginal yeast infection  -First urine sample positive for trichomonas. Patient denies recent sexual activity, abuse. Repeat UA does not show trichomonas. Flagyl x 1 dose given in ED. First sample likely contaminant. Urine culture negative for bacteria, positive for yeast. Diflucan x 1 dose given for yeast. Antibiotics stopped.   DM type 2, with CKD stage 3 -Diet controlled at home -Ha1c 7.4 -SSI  Chronic venous stasis ulcer left LE -Follows with wound care clinic and home RN  -Wound consulted  -Monitor  Generalized weakness -Secondary to above -PT/OT  HTN -Norvasc, metoprolol   HLD -Lipitor    Discharge Instructions  Discharge Instructions    Diet - low sodium heart healthy    Complete by:  As directed    Increase activity slowly    Complete by:  As directed        Medication List    STOP taking these medications   lisinopril 20 MG tablet Commonly known as:  PRINIVIL,ZESTRIL     TAKE these medications   acetaminophen 500 MG tablet Commonly known as:  TYLENOL Take 500 mg by mouth every 8 (eight) hours as needed for moderate pain.   amLODipine 10 MG tablet Commonly  known as:  NORVASC Take 10 mg by mouth daily.   aspirin EC 81 MG tablet Take 81 mg by mouth daily.   atorvastatin 40 MG tablet Commonly known as:  LIPITOR Take 40 mg by mouth daily.   metoprolol succinate 100 MG 24 hr tablet Commonly known as:  TOPROL-XL Take 100 mg by mouth daily. Take with or immediately following a meal.      Follow-up Information    HOLWERDA, SCOTT, MD. Schedule an appointment as soon as possible for a visit in 1 week(s).   Specialty:  Internal Medicine Contact information: [address] [place] [address] [phone]          No Known Allergies  Consultations:  None   Procedures/Studies: US Renal  Result Date: 02/07/2016 CLINICAL DATA:  82 year old female with acute on chronic renal failure. History of diabetes and hypertension. EXAM: RENAL / URINARY TRACT ULTRASOUND COMPLETE COMPARISON:  None. FINDINGS: Right Kidney: Length: 9 cm. There is diffuse cortical thinning with mild increased echotexture. No hydronephrosis or echogenic stone. Left Kidney: Length: 9 cm. Diffuse cortical thinning with mild increased echogenicity. No hydronephrosis or echogenic stone. Bladder: Layering echogenic debris noted within the bladder. The urinary bladder is otherwise grossly unremarkable. Bilateral ureteral jets noted. IMPRESSION: Moderate bilateral renal atrophy and cortical thinning, likely related to chronic kidney disease. No hydronephrosis or echogenic stone. Layering echogenic debris within the urinary bladder. Correlation with urinalysis recommended. Bilateral ureteral jets identified. Electronically Signed   By: Anner Crete M.D.   On: 02/07/2016 06:57   Dg Chest Portable 1 View  Result Date: 02/06/2016 CLINICAL DATA:  Dizziness.  Cough. EXAM: PORTABLE CHEST 1 VIEW COMPARISON:  No recent prior . FINDINGS: Mediastinum hilar structures are normal. Low lung volumes with mild basilar atelectasis. Heart size normal. Tiny left pleural effusion cannot be  excluded. No pneumothorax. No acute bony abnormality. Degenerative changes both shoulders. IMPRESSION: Low lung volumes with mild basilar atelectasis. Tiny left pleural effusion cannot be excluded. Exam is otherwise unremarkable. Electronically Signed   By: Marcello Moores  Register   On: 02/06/2016 14:16     Discharge Exam: Vitals:   02/09/16 0613 02/09/16 1010  BP: (!) 178/84 135/64  Pulse: 70 78  Resp: 17 18  Temp: 97.6 F (36.4 C) 98.4 F (36.9 C)   Vitals:   02/08/16 1711 02/08/16 2053 02/09/16 0613 02/09/16 1010  BP: (!) 163/70 (!) 180/84 (!) 178/84 135/64  Pulse: 68 80 70 78  Resp: 18 20 17 18   Temp: 98.1 F (36.7 C) 98.7 F (37.1 C) 97.6 F (36.4 C) 98.4 F (36.9 C)  TempSrc: Oral Oral Oral Oral  SpO2: 96% 99% 97% 97%  Weight:  91.5 kg (201 lb 11.5 oz)    Height:       General exam: Appears calm and comfortable  Respiratory system: Clear to auscultation. Respiratory effort normal. Cardiovascular system: S1 & S2 heard, RRR. No JVD, murmurs, rubs, gallops or clicks. No pedal edema. Gastrointestinal system: Abdomen is nondistended, soft and nontender. No organomegaly or masses felt. Normal bowel sounds heard. Central nervous system: Alert and oriented.  No focal neurological deficits. Extremities: Symmetric 5 x 5 power. Skin: bilateral LE wounds wrapped, dressing clean and dry  Psychiatry: Judgement and insight poor. Mood & affect appropriate.     The results of significant diagnostics from this hospitalization (including imaging, microbiology, ancillary and laboratory) are listed below for reference.     Microbiology: Recent Results (from the past 240 hour(s))  Urine culture     Status: Abnormal   Collection Time: 02/06/16  9:00 PM  Result Value Ref Range Status   Specimen Description URINE, CATHETERIZED  Final   Special Requests NONE  Final   Culture 40,000 COLONIES/mL YEAST (A)  Final   Report Status 02/08/2016 FINAL  Final     Labs: BNP (last 3 results) No  results for input(s): BNP in the last 8760 hours. Basic Metabolic Panel:  Recent Labs Lab 02/06/16 1324 02/07/16 0545 02/08/16 0534 02/08/16 1526 02/09/16 0849  NA 141 142 141 140 142  K 4.1 3.5 3.6 3.4* 3.6  CL 107 110 110 109 112*  CO2 23 21* 23 24 23   GLUCOSE 162* 154* 102* 199* 140*  BUN 42* 34* 30* 30* 26*  CREATININE 1.93* 1.67* 1.86* 1.69* 1.53*  CALCIUM 9.4 8.8* 8.4* 8.2* 8.2*  MG  --  1.9  --   --   --   PHOS  --  2.8  --   --   --    Liver Function Tests:  Recent Labs Lab 02/06/16 1436 02/07/16 0545  AST 31 28  ALT 10* 9*  ALKPHOS 74 65  BILITOT 0.7 0.5  PROT 7.9 7.2  ALBUMIN 3.5 3.1*   No results for input(s): LIPASE, AMYLASE in the last 168 hours. No results for input(s): AMMONIA in the last 168 hours. CBC:  Recent Labs Lab 02/06/16 1324 02/07/16 0545  WBC 7.6 7.2  HGB 13.0 12.3  HCT 38.9 37.4  MCV 87.8 87.4  PLT 258 244   Cardiac Enzymes: No results for input(s): CKTOTAL, CKMB, CKMBINDEX, TROPONINI in the last 168 hours. BNP: Invalid input(s): POCBNP CBG:  Recent Labs Lab 02/08/16 1204 02/08/16 1709 02/08/16 2051 02/09/16 0759 02/09/16 1148  GLUCAP 141* 204* 184* 139* 154*   D-Dimer No results for input(s): DDIMER in the last 72 hours. Hgb A1c  Recent Labs  02/07/16 0545  HGBA1C 7.4*   Lipid Profile No results for input(s): CHOL, HDL, LDLCALC, TRIG, CHOLHDL, LDLDIRECT in the last 72 hours. Thyroid function studies  Recent Labs  02/07/16 0545  TSH 0.395   Anemia work up No results for input(s): VITAMINB12, FOLATE, FERRITIN, TIBC, IRON, RETICCTPCT in the last 72 hours. Urinalysis    Component Value Date/Time   COLORURINE YELLOW 02/06/2016 2100   APPEARANCEUR HAZY (A) 02/06/2016 2100   LABSPEC 1.017 02/06/2016 2100   PHURINE 6.0 02/06/2016 2100   GLUCOSEU NEGATIVE 02/06/2016 2100   HGBUR MODERATE (A) 02/06/2016 2100   [place] NEGATIVE 02/06/2016 2100   [place] NEGATIVE 02/06/2016 2100   PROTEINUR 100 (A)  02/06/2016 2100   NITRITE NEGATIVE 02/06/2016 2100   LEUKOCYTESUR MODERATE (A) 02/06/2016 2100   Sepsis Labs Invalid input(s): PROCALCITONIN,  WBC,  LACTICIDVEN Microbiology Recent Results (from the past 240 hour(s))  Urine culture     Status: Abnormal   Collection Time: 02/06/16  9:00 PM  Result Value Ref Range Status   Specimen Description URINE, CATHETERIZED  Final   Special Requests NONE  Final   Culture 40,000 COLONIES/mL YEAST (A)  Final   Report Status 02/08/2016 FINAL  Final     Time coordinating discharge: Over 30 minutes  SIGNED:  Dessa Phi, DO [facility] Pager [phone]  If 7PM-7AM, please contact night-coverage [email or web address] Password [email or web address] 02/09/2016, 4:08 PM

## 2016-02-10 DIAGNOSIS — I10 Essential (primary) hypertension: Secondary | ICD-10-CM | POA: Diagnosis not present

## 2016-02-10 DIAGNOSIS — Z4800 Encounter for change or removal of nonsurgical wound dressing: Secondary | ICD-10-CM | POA: Diagnosis not present

## 2016-02-10 DIAGNOSIS — L97219 Non-pressure chronic ulcer of right calf with unspecified severity: Secondary | ICD-10-CM | POA: Diagnosis not present

## 2016-02-10 DIAGNOSIS — E11649 Type 2 diabetes mellitus with hypoglycemia without coma: Secondary | ICD-10-CM | POA: Diagnosis not present

## 2016-02-10 DIAGNOSIS — I872 Venous insufficiency (chronic) (peripheral): Secondary | ICD-10-CM | POA: Diagnosis not present

## 2016-02-10 DIAGNOSIS — L97229 Non-pressure chronic ulcer of left calf with unspecified severity: Secondary | ICD-10-CM | POA: Diagnosis not present

## 2016-02-11 LAB — GLUCOSE, CAPILLARY: Glucose-Capillary: 162 mg/dL — ABNORMAL HIGH (ref 65–99)

## 2016-02-11 MED FILL — Ceftriaxone Sodium For Inj 1 GM: INTRAMUSCULAR | Qty: 10 | Status: AC

## 2016-02-11 MED FILL — Dextrose Inj 5%: INTRAVENOUS | Qty: 50 | Status: AC

## 2016-02-12 DIAGNOSIS — Z4800 Encounter for change or removal of nonsurgical wound dressing: Secondary | ICD-10-CM | POA: Diagnosis not present

## 2016-02-12 DIAGNOSIS — L97219 Non-pressure chronic ulcer of right calf with unspecified severity: Secondary | ICD-10-CM | POA: Diagnosis not present

## 2016-02-12 DIAGNOSIS — E11649 Type 2 diabetes mellitus with hypoglycemia without coma: Secondary | ICD-10-CM | POA: Diagnosis not present

## 2016-02-12 DIAGNOSIS — I10 Essential (primary) hypertension: Secondary | ICD-10-CM | POA: Diagnosis not present

## 2016-02-12 DIAGNOSIS — I872 Venous insufficiency (chronic) (peripheral): Secondary | ICD-10-CM | POA: Diagnosis not present

## 2016-02-12 DIAGNOSIS — L97229 Non-pressure chronic ulcer of left calf with unspecified severity: Secondary | ICD-10-CM | POA: Diagnosis not present

## 2016-02-15 DIAGNOSIS — L97219 Non-pressure chronic ulcer of right calf with unspecified severity: Secondary | ICD-10-CM | POA: Diagnosis not present

## 2016-02-15 DIAGNOSIS — E11649 Type 2 diabetes mellitus with hypoglycemia without coma: Secondary | ICD-10-CM | POA: Diagnosis not present

## 2016-02-15 DIAGNOSIS — I872 Venous insufficiency (chronic) (peripheral): Secondary | ICD-10-CM | POA: Diagnosis not present

## 2016-02-15 DIAGNOSIS — Z4800 Encounter for change or removal of nonsurgical wound dressing: Secondary | ICD-10-CM | POA: Diagnosis not present

## 2016-02-15 DIAGNOSIS — I10 Essential (primary) hypertension: Secondary | ICD-10-CM | POA: Diagnosis not present

## 2016-02-15 DIAGNOSIS — L97229 Non-pressure chronic ulcer of left calf with unspecified severity: Secondary | ICD-10-CM | POA: Diagnosis not present

## 2016-02-21 ENCOUNTER — Inpatient Hospital Stay (HOSPITAL_COMMUNITY)
Admission: EM | Admit: 2016-02-21 | Discharge: 2016-02-26 | DRG: 872 | Disposition: A | Discharge status: Skilled Nursing Facility | Payer: Medicare Other | Attending: Internal Medicine | Admitting: Internal Medicine

## 2016-02-21 ENCOUNTER — Encounter (HOSPITAL_COMMUNITY): Payer: Self-pay | Admitting: *Deleted

## 2016-02-21 ENCOUNTER — Emergency Department (HOSPITAL_COMMUNITY): Payer: Medicare Other

## 2016-02-21 ENCOUNTER — Inpatient Hospital Stay (HOSPITAL_COMMUNITY): Payer: Medicare Other

## 2016-02-21 DIAGNOSIS — R402411 Glasgow coma scale score 13-15, in the field [EMT or ambulance]: Secondary | ICD-10-CM | POA: Diagnosis not present

## 2016-02-21 DIAGNOSIS — I129 Hypertensive chronic kidney disease with stage 1 through stage 4 chronic kidney disease, or unspecified chronic kidney disease: Secondary | ICD-10-CM | POA: Diagnosis present

## 2016-02-21 DIAGNOSIS — N390 Urinary tract infection, site not specified: Secondary | ICD-10-CM

## 2016-02-21 DIAGNOSIS — R197 Diarrhea, unspecified: Secondary | ICD-10-CM | POA: Diagnosis present

## 2016-02-21 DIAGNOSIS — I83019 Varicose veins of right lower extremity with ulcer of unspecified site: Secondary | ICD-10-CM | POA: Diagnosis not present

## 2016-02-21 DIAGNOSIS — N183 Chronic kidney disease, stage 3 (moderate): Secondary | ICD-10-CM | POA: Diagnosis not present

## 2016-02-21 DIAGNOSIS — E1151 Type 2 diabetes mellitus with diabetic peripheral angiopathy without gangrene: Secondary | ICD-10-CM | POA: Diagnosis present

## 2016-02-21 DIAGNOSIS — M79606 Pain in leg, unspecified: Secondary | ICD-10-CM | POA: Diagnosis not present

## 2016-02-21 DIAGNOSIS — E1122 Type 2 diabetes mellitus with diabetic chronic kidney disease: Secondary | ICD-10-CM | POA: Diagnosis present

## 2016-02-21 DIAGNOSIS — R41841 Cognitive communication deficit: Secondary | ICD-10-CM | POA: Diagnosis not present

## 2016-02-21 DIAGNOSIS — Z66 Do not resuscitate: Secondary | ICD-10-CM | POA: Diagnosis present

## 2016-02-21 DIAGNOSIS — L89152 Pressure ulcer of sacral region, stage 2: Secondary | ICD-10-CM | POA: Diagnosis present

## 2016-02-21 DIAGNOSIS — I878 Other specified disorders of veins: Secondary | ICD-10-CM | POA: Diagnosis not present

## 2016-02-21 DIAGNOSIS — A419 Sepsis, unspecified organism: Secondary | ICD-10-CM | POA: Diagnosis present

## 2016-02-21 DIAGNOSIS — M6281 Muscle weakness (generalized): Secondary | ICD-10-CM | POA: Diagnosis not present

## 2016-02-21 DIAGNOSIS — L8990 Pressure ulcer of unspecified site, unspecified stage: Secondary | ICD-10-CM | POA: Insufficient documentation

## 2016-02-21 DIAGNOSIS — I739 Peripheral vascular disease, unspecified: Secondary | ICD-10-CM | POA: Diagnosis not present

## 2016-02-21 DIAGNOSIS — B3749 Other urogenital candidiasis: Secondary | ICD-10-CM | POA: Diagnosis present

## 2016-02-21 DIAGNOSIS — M25512 Pain in left shoulder: Secondary | ICD-10-CM | POA: Diagnosis not present

## 2016-02-21 DIAGNOSIS — L97909 Non-pressure chronic ulcer of unspecified part of unspecified lower leg with unspecified severity: Secondary | ICD-10-CM | POA: Diagnosis present

## 2016-02-21 DIAGNOSIS — E1165 Type 2 diabetes mellitus with hyperglycemia: Secondary | ICD-10-CM | POA: Diagnosis present

## 2016-02-21 DIAGNOSIS — D72829 Elevated white blood cell count, unspecified: Secondary | ICD-10-CM | POA: Diagnosis not present

## 2016-02-21 DIAGNOSIS — Z87891 Personal history of nicotine dependence: Secondary | ICD-10-CM | POA: Diagnosis not present

## 2016-02-21 DIAGNOSIS — Z79899 Other long term (current) drug therapy: Secondary | ICD-10-CM

## 2016-02-21 DIAGNOSIS — N3000 Acute cystitis without hematuria: Secondary | ICD-10-CM | POA: Diagnosis not present

## 2016-02-21 DIAGNOSIS — R739 Hyperglycemia, unspecified: Secondary | ICD-10-CM | POA: Diagnosis not present

## 2016-02-21 DIAGNOSIS — Z7982 Long term (current) use of aspirin: Secondary | ICD-10-CM

## 2016-02-21 DIAGNOSIS — R531 Weakness: Secondary | ICD-10-CM | POA: Diagnosis not present

## 2016-02-21 DIAGNOSIS — R000 Tachycardia, unspecified: Secondary | ICD-10-CM | POA: Diagnosis present

## 2016-02-21 DIAGNOSIS — I83009 Varicose veins of unspecified lower extremity with ulcer of unspecified site: Secondary | ICD-10-CM | POA: Diagnosis present

## 2016-02-21 DIAGNOSIS — R5381 Other malaise: Secondary | ICD-10-CM | POA: Diagnosis present

## 2016-02-21 DIAGNOSIS — E1129 Type 2 diabetes mellitus with other diabetic kidney complication: Secondary | ICD-10-CM | POA: Diagnosis not present

## 2016-02-21 DIAGNOSIS — R4182 Altered mental status, unspecified: Secondary | ICD-10-CM | POA: Diagnosis not present

## 2016-02-21 DIAGNOSIS — R6520 Severe sepsis without septic shock: Secondary | ICD-10-CM | POA: Diagnosis not present

## 2016-02-21 DIAGNOSIS — M19012 Primary osteoarthritis, left shoulder: Secondary | ICD-10-CM | POA: Diagnosis not present

## 2016-02-21 DIAGNOSIS — M25519 Pain in unspecified shoulder: Secondary | ICD-10-CM

## 2016-02-21 LAB — CBC WITH DIFFERENTIAL/PLATELET
BASOS ABS: 0 10*3/uL (ref 0.0–0.1)
BASOS PCT: 0 %
Eosinophils Absolute: 0 10*3/uL (ref 0.0–0.7)
Eosinophils Relative: 0 %
HEMATOCRIT: 34.4 % — AB (ref 36.0–46.0)
HEMOGLOBIN: 11.9 g/dL — AB (ref 12.0–15.0)
Lymphocytes Relative: 12 %
Lymphs Abs: 1.3 10*3/uL (ref 0.7–4.0)
MCH: 29.6 pg (ref 26.0–34.0)
MCHC: 34.6 g/dL (ref 30.0–36.0)
MCV: 85.6 fL (ref 78.0–100.0)
MONO ABS: 1.1 10*3/uL — AB (ref 0.1–1.0)
Monocytes Relative: 10 %
NEUTROS ABS: 8.4 10*3/uL — AB (ref 1.7–7.7)
NEUTROS PCT: 78 %
Platelets: 285 10*3/uL (ref 150–400)
RBC: 4.02 MIL/uL (ref 3.87–5.11)
RDW: 15.5 % (ref 11.5–15.5)
WBC: 10.7 10*3/uL — ABNORMAL HIGH (ref 4.0–10.5)

## 2016-02-21 LAB — URINALYSIS, ROUTINE W REFLEX MICROSCOPIC
BILIRUBIN URINE: NEGATIVE
Glucose, UA: NEGATIVE mg/dL
Ketones, ur: NEGATIVE mg/dL
NITRITE: NEGATIVE
PH: 6 (ref 5.0–8.0)
Protein, ur: 30 mg/dL — AB
SPECIFIC GRAVITY, URINE: 1.015 (ref 1.005–1.030)

## 2016-02-21 LAB — COMPREHENSIVE METABOLIC PANEL
ALBUMIN: 3 g/dL — AB (ref 3.5–5.0)
ALT: 12 U/L — ABNORMAL LOW (ref 14–54)
ANION GAP: 8 (ref 5–15)
AST: 34 U/L (ref 15–41)
Alkaline Phosphatase: 105 U/L (ref 38–126)
BILIRUBIN TOTAL: 0.9 mg/dL (ref 0.3–1.2)
BUN: 33 mg/dL — AB (ref 6–20)
CHLORIDE: 105 mmol/L (ref 101–111)
CO2: 24 mmol/L (ref 22–32)
Calcium: 9 mg/dL (ref 8.9–10.3)
Creatinine, Ser: 1.6 mg/dL — ABNORMAL HIGH (ref 0.44–1.00)
GFR calc Af Amer: 30 mL/min — ABNORMAL LOW (ref >60)
GFR calc non Af Amer: 26 mL/min — ABNORMAL LOW (ref >60)
GLUCOSE: 148 mg/dL — AB (ref 65–99)
POTASSIUM: 4 mmol/L (ref 3.5–5.1)
SODIUM: 137 mmol/L (ref 135–145)
TOTAL PROTEIN: 7.7 g/dL (ref 6.5–8.1)

## 2016-02-21 LAB — LIPASE, BLOOD: Lipase: 20 U/L (ref 11–51)

## 2016-02-21 LAB — URINE MICROSCOPIC-ADD ON

## 2016-02-21 LAB — I-STAT CG4 LACTIC ACID, ED: Lactic Acid, Venous: 1.29 mmol/L (ref 0.5–1.9)

## 2016-02-21 MED ORDER — SODIUM CHLORIDE 0.9% FLUSH
3.0000 mL | Freq: Two times a day (BID) | INTRAVENOUS | Status: DC
  Administered 2016-02-22 – 2016-02-25 (×6): 3 mL via INTRAVENOUS

## 2016-02-21 MED ORDER — DEXTROSE 5 % IV SOLN
1.0000 g | Freq: Once | INTRAVENOUS | Status: AC
  Administered 2016-02-21: 1 g via INTRAVENOUS
  Filled 2016-02-21: qty 10

## 2016-02-21 MED ORDER — SODIUM CHLORIDE 0.9 % IV SOLN
INTRAVENOUS | Status: DC
  Administered 2016-02-23 – 2016-02-25 (×2): via INTRAVENOUS

## 2016-02-21 MED ORDER — METOPROLOL SUCCINATE ER 100 MG PO TB24
100.0000 mg | ORAL_TABLET | Freq: Every day | ORAL | Status: DC
  Administered 2016-02-21 – 2016-02-26 (×6): 100 mg via ORAL
  Filled 2016-02-21 (×6): qty 1

## 2016-02-21 MED ORDER — METRONIDAZOLE IN NACL 5-0.79 MG/ML-% IV SOLN
500.0000 mg | Freq: Three times a day (TID) | INTRAVENOUS | Status: DC
  Administered 2016-02-21 – 2016-02-22 (×2): 500 mg via INTRAVENOUS
  Filled 2016-02-21 (×3): qty 100

## 2016-02-21 MED ORDER — AMLODIPINE BESYLATE 10 MG PO TABS
10.0000 mg | ORAL_TABLET | Freq: Every day | ORAL | Status: DC
  Administered 2016-02-21 – 2016-02-26 (×6): 10 mg via ORAL
  Filled 2016-02-21 (×6): qty 1

## 2016-02-21 MED ORDER — ENOXAPARIN SODIUM 30 MG/0.3ML ~~LOC~~ SOLN
30.0000 mg | SUBCUTANEOUS | Status: DC
  Administered 2016-02-21 – 2016-02-25 (×5): 30 mg via SUBCUTANEOUS
  Filled 2016-02-21 (×5): qty 0.3

## 2016-02-21 NOTE — H&P (Addendum)
[facility] History and Physical   Tariyah Flitter [id number] DOB: Aug 19, 1915 DOA: 02/21/2016  Referring physician: ED PCP: [name], MD  Specialists: none  Chief Complaint:    HPI:   82 y/o ? know venous stasis ulcers,   MOD PAD RLE, LLE-used to follow Dr. Bridgett Larsson              [patient not interested in any work-up or management] htn Gout  Recent admission 10/26-->11/6 for AKI, ty II DM gen weakness and Yeast UTI all Rx Was advised d/c to CIR vs SNF elected to go home instead  She is ableto give some history and report sthat she has been feeling "ill" Her L shoulder and her back pain. She however is not really able to elaborate further Her niece who is at the bedside mentions that she has been hallucinating and grabbing at things that are not there and at baseline she is a little bit more apparent Patient is able to tell me that this is 2018 (this is not thousand 18 this 2017) thinks that this is November Cannot remember president and can tell me that she is in [place] but cannot remember the city which is unusual for her  When she was first seen in the emergency room she was covered in stool and had diarrhea Her wounds in her lower extremities were also crusted and covered  She had a cath specimen urine that showed many bacteria and nitrites White count was 10.7 hemoglobin 11 0.9 platelets 285 BUN/creatinine about her baseline 33/1.6 LFTs normal Glucose slightly elevated 140s Chest x-ray was within normal limits  At baseline she sits in her wheelchair most the day She wears depends She lives alone at home and has been at home alone for a while Her healthcare power of attorney is her nephew      Review of Systems:  Patient is not able to coherently give me a clear history  Past Medical History:  Diagnosis Date  . Diabetes ([place])    SLOW TO HEAL  . Gout   . HBP (high blood pressure)   . Kidney disease   . Poor circulation    Past Surgical  History:  Procedure Laterality Date  . APPENDECTOMY    . CATARACT EXTRACTION Bilateral    Social History:  Social History   Social History Narrative  . No narrative on file    No Known Allergies  Family History  Problem Relation Age of Onset  . Diabetes Mother   . Hypertension Brother   . Cancer Neg Hx     Prior to Admission medications   Medication Sig Start Date End Date Taking? Authorizing Provider  acetaminophen (TYLENOL) 500 MG tablet Take 1,000 mg by mouth every 8 (eight) hours as needed for moderate pain.    Yes Historical Provider, MD  amLODipine (NORVASC) 10 MG tablet Take 10 mg by mouth daily.  08/09/14  Yes Historical Provider, MD  aspirin EC 81 MG tablet Take 81 mg by mouth daily.   Yes Historical Provider, MD  atorvastatin (LIPITOR) 40 MG tablet Take 40 mg by mouth daily.  09/24/13  Yes Historical Provider, MD  metoprolol succinate (TOPROL-XL) 100 MG 24 hr tablet Take 100 mg by mouth daily. Take with or immediately following a meal.   Yes Historical Provider, MD   Physical Exam: Vitals:   02/21/16 1119 02/21/16 1120 02/21/16 1324  BP: 164/82  159/90  Pulse: 104  101  Resp: 17  17  Temp:  98.9 F (37.2 C)    SpO2: 96%  99%  Weight: 93 kg (205 lb) 93 kg (205 lb)     EOMI NCAT No pallor no icterus Closes eyes and does not allow full exam however I did not appreciate any icterus or pallor when they're open Mallampati 3 No JVD Chest is clinically clear She does have a decubitus ulcer on the bottom and passed a diarrhea stool while I was in the room  Labs on Admission:  Basic Metabolic Panel:  Recent Labs Lab 02/21/16 1219  NA 137  K 4.0  CL 105  CO2 24  GLUCOSE 148*  BUN 33*  CREATININE 1.60*  CALCIUM 9.0   Liver Function Tests:  Recent Labs Lab 02/21/16 1219  AST 34  ALT 12*  ALKPHOS 105  BILITOT 0.9  PROT 7.7  ALBUMIN 3.0*    Recent Labs Lab 02/21/16 1219  LIPASE 20   No results for input(s): AMMONIA in the last 168  hours. CBC:  Recent Labs Lab 02/21/16 1219  WBC 10.7*  NEUTROABS 8.4*  HGB 11.9*  HCT 34.4*  MCV 85.6  PLT 285   Cardiac Enzymes: No results for input(s): CKTOTAL, CKMB, CKMBINDEX, TROPONINI in the last 168 hours.  BNP (last 3 results) No results for input(s): BNP in the last 8760 hours.  ProBNP (last 3 results) No results for input(s): PROBNP in the last 8760 hours.  CBG: No results for input(s): GLUCAP in the last 168 hours.  Radiological Exams on Admission: Dg Chest 2 View  Result Date: 02/21/2016 CLINICAL DATA:  Altered mental status EXAM: CHEST  2 VIEW COMPARISON:  02/06/2016 FINDINGS: The heart size and mediastinal contours are within normal limits. Both lungs are clear. The visualized skeletal structures are unremarkable. IMPRESSION: No active cardiopulmonary disease. Electronically Signed   By: Kerby Moors M.D.   On: 02/21/2016 13:05    EKG: Independently reviewed. Sinus tachycardia PR interval 0 point 04 QRS axis -20 No ST-T wave changes  Assessment/Plan  Sepsis probably from pyelonephritis versus diarrheal disease Patient has already been treated in the recent past with ceftriaxone We will double cover with Flagyl also 500 every 8 If C. difficile testing is negative this can be discontinued Trend CBC IV saline at 75 cc an hour Allow diet Trend lactic acid  Sinus tachycardia Probably secondary to sepsis physiology Unclear if she is taking her metoprolol 100 mg XL daily which we will give now  Hyperglycemia without diagnosis of diabetes mellitus Check a.m. blood sugars May need sliding scale coverage if trending above 180  History of gout Unusual to have gout pain in shoulder although certainly possible Note that she is not on any Medications at present time. getshoudlerxray and follow up  ? Neglect Patient was found in feces and debilitated at home and was not getting the care that was reported to of the needed Niece tells me that the patient  was supposed to have home health but the only came out once as posted 3 times a week This will need to be investigated  Peripheral arterial disease prior follow-up by Dr. Bridgett Larsson Patient was not interested in at that time in any type of operation  Chronic kidney disease stage 3 At about baseline continue saline as above   Admit to telemetry  Lovenox renally dosed Inpatient Discussed with [facility] POA relative on phone and relative in person Patient tells me she was to be doing DO NOT RESUSCITATE  Verlon Au [facility] Pager [phone]  If 7PM-7AM, please contact night-coverage [email or web address] Password [email or web address] 02/21/2016, 3:15 PM

## 2016-02-21 NOTE — ED Notes (Signed)
In and out cath completed after patient was cleaned with cleansing spray and wash clothes, followed by betadine swabs.

## 2016-02-21 NOTE — ED Notes (Signed)
Pt cleaned. Not enough stool for a C-Diff specimen.

## 2016-02-21 NOTE — Progress Notes (Signed)
Cm consult- Jill Allen, niece reports that they are not very satisfied with [facility] spoke with pt, POA nephew, aunt Bonnita Nasuti and another female visitor in pt room Cm inquired if they had shared their concerns with [facility] staff and offered a [place] home health agency list to assist with their choice of a new home health agency  Pt alert to person, place and time She was able to tell Cm her name, she was in the hospital at [facility], it was November on "thursday or Friday" and "donald" was president Informed Cm she was from [place] and knew he had large hotels and was "married three times"  Nephew, who informed CM he was pt's POA asked CM not to touch pt feet when CM barely touched pt foot to awaken her after she dozed off to allow pt to get answers to her questions Pt did not flinch nor seem disturbed when Cm touched her foot Pt informed CM she did not understand "why?" she needed a home health agency CM discussed HHRNs, PT/OT/aide and social workers provide services in pt homes from [employer]. Female visitor states pt may have some memory issues CM discussed that neurologists generally are available to assist with diagnosis for memory loss, alzheimer, dementia, etc  Cm offered to answer further questions for the pt and visitors about other home health issues but there were none Cm encouraged pt and visitors to review list of hh agencies and notify a RN or CM of their choice  Tim of [facility] made aware of Jill Allen, niece not being satisfied with [facility] and that pt and visitors were provided a list of [facility] agencies to assist with choice of another hh agency if determined

## 2016-02-21 NOTE — ED Notes (Signed)
Patient presents to ED from home via EMS.  Her pajamas and bed linens from home were saturated with urine and feces.  Patient has a stage 2 pressure ulcer on her buttocks and several stage 2 pressure ulcers on her legs and feet bilaterally as well as bilateral venous stasis in lower extremities.  Patient denies pain anywhere except feet and her left shoulder, which is painful to movement.  Patient is uncertain why.  Patient denies cough and fever.  Patient's abdomen is soft, pendulous and non-tender to palpation.  Patient's lung sounds are diminished.

## 2016-02-21 NOTE — ED Notes (Signed)
Bed: [id number] Expected date:  Expected time:  Means of arrival:  Comments: EMS- 82yo F, AMS/sepsis?/foot wounds

## 2016-02-21 NOTE — ED Triage Notes (Signed)
Per EMS - patient from home where she lives with her nephew with c/o altered mental status.  Patient has decubitus ulcers on bilateral feet.  She was seen here and hospitalized last week for a UTI and weakness.  Patient is typically alert & oriented, but is now somewhat confused.  Patient has home healthcare come change the bandages on her feet, but they have not been out to change the bandages in a week.    Temporal temp 99.3.  CBG 130, BP 160/100, HR 105, 100% RA, RR 24.

## 2016-02-21 NOTE — ED Provider Notes (Addendum)
[facility] DEPT Provider Note   CSN: [id number] Arrival date & time: 02/21/16  1058     History   Chief Complaint Chief Complaint  Patient presents with  . Altered Mental Status    HPI Jill Allen is a 82 y.o. female with a past medical history significant for diabetes, hypertension, chronic kidney disease, and chronic leg wounds with recent admission for UTI and fatigue who presents with altered mental status, fatigue, and pain. According to patient, she was admitted last week for fatigue and urinary tract infection. She reports that she was discharged home and lives with family. According to EMS who spoke with family, family was concerned with altered mental status today. According to family, patient is supposed to have home health come change her bandages on her chronic leg wounds but reportedly, they have not been there this week. According to EMS, patient was laying in urine, feces, and appears to have developed new ulcerations on her low back.   She reports that she has had a recent cough. She denies any chest pain or abdominal pain. She reports some pain in her low back and in her legs. She denies any fevers, chills, nausea, vomiting, constipation, diarrhea, or change in urination. She denies any headache, vision changes, or neurologic deficits. She denies any traumas.       The history is provided by a relative, medical records and the patient. No language interpreter was used.  Altered Mental Status   This is a new problem. The current episode started more than 1 week ago. The problem has been gradually worsening. Associated symptoms include confusion. Pertinent negatives include no unresponsiveness, no weakness and no agitation. Risk factors include a recent illness (recent UTI).    Past Medical History:  Diagnosis Date  . Diabetes ([place])    SLOW TO HEAL  . Gout   . HBP (high blood pressure)   . Kidney disease   . Poor circulation     Patient Active Problem List    Diagnosis Date Noted  . UTI (urinary tract infection) 02/06/2016  . Dehydration 02/06/2016  . Acute renal failure (ARF) ([place]) 02/06/2016  . CKD (chronic kidney disease), stage III 02/06/2016  . DM (diabetes mellitus), type 2 with renal complications ([place]) 0000000  . Venous stasis 02/06/2016  . PAOD (peripheral arterial occlusive disease) ([place]) 11/03/2014  . Chronic venous insufficiency 08/18/2014  . Venous stasis ulcer of right lower extremity ([place]) 08/18/2014  . Venous stasis ulcer of left lower extremity ([place]) 08/18/2014    Past Surgical History:  Procedure Laterality Date  . APPENDECTOMY    . CATARACT EXTRACTION Bilateral     OB History    No data available       Home Medications    Prior to Admission medications   Medication Sig Start Date End Date Taking? Authorizing Provider  acetaminophen (TYLENOL) 500 MG tablet Take 500 mg by mouth every 8 (eight) hours as needed for moderate pain.    Historical Provider, MD  amLODipine (NORVASC) 10 MG tablet Take 10 mg by mouth daily.  08/09/14   Historical Provider, MD  aspirin EC 81 MG tablet Take 81 mg by mouth daily.    Historical Provider, MD  atorvastatin (LIPITOR) 40 MG tablet Take 40 mg by mouth daily.  09/24/13   Historical Provider, MD  metoprolol succinate (TOPROL-XL) 100 MG 24 hr tablet Take 100 mg by mouth daily. Take with or immediately following a meal.    Historical Provider, MD  Family History Family History  Problem Relation Age of Onset  . Diabetes Mother   . Hypertension Brother   . Cancer Neg Hx     Social History Social History  Substance Use Topics  . Smoking status: Former Smoker    Types: Cigarettes    Quit date: 01/19/1973  . Smokeless tobacco: Never Used  . Alcohol use No     Allergies   Patient has no known allergies.   Review of Systems Review of Systems  Constitutional: Negative for activity change, chills, diaphoresis, fatigue and fever.  HENT: Negative for congestion and  rhinorrhea.   Eyes: Negative for visual disturbance.  Respiratory: Negative for cough, chest tightness, shortness of breath and stridor.   Cardiovascular: Negative for chest pain, palpitations and leg swelling.  Gastrointestinal: Negative for abdominal distention, abdominal pain, constipation, diarrhea, nausea and vomiting.  Genitourinary: Negative for difficulty urinating, dysuria, flank pain, frequency, hematuria, menstrual problem, pelvic pain, vaginal bleeding and vaginal discharge.  Musculoskeletal: Negative for back pain and neck pain.  Skin: Negative for rash and wound.  Neurological: Negative for dizziness, weakness, light-headedness, numbness and headaches.  Psychiatric/Behavioral: Positive for confusion. Negative for agitation.  All other systems reviewed and are negative.    Physical Exam Updated Vital Signs BP 164/82 (BP Location: Left Arm)   Pulse 104   Temp 98.9 F (37.2 C)   Resp 17   Wt 205 lb (93 kg)   SpO2 96%   BMI 34.11 kg/m   Physical Exam  Constitutional: She appears well-developed and well-nourished. No distress.  HENT:  Head: Normocephalic and atraumatic.  Mouth/Throat: Oropharynx is clear and moist. No oropharyngeal exudate.  Eyes: Conjunctivae and EOM are normal. Pupils are equal, round, and reactive to light.  Neck: Normal range of motion. Neck supple.  Cardiovascular: Normal rate and regular rhythm.   No murmur heard. Pulmonary/Chest: Effort normal and breath sounds normal. No respiratory distress. She has no wheezes. She exhibits no tenderness.  Abdominal: Soft. There is no tenderness.  Musculoskeletal: She exhibits tenderness. She exhibits no edema.  Neurological: She is alert. She is disoriented. No cranial nerve deficit or sensory deficit. She exhibits normal muscle tone.  Patient disoriented to place and time. Family reports that patient has seemed more confused today.  Skin: Skin is warm and dry. Capillary refill takes less than 2 seconds.  Rash noted. She is not diaphoretic. There is erythema.     Nursing note and vitals reviewed.    ED Treatments / Results  Labs (all labs ordered are listed, but only abnormal results are displayed) Labs Reviewed  CBC WITH DIFFERENTIAL/PLATELET - Abnormal; Notable for the following:       Result Value   WBC 10.7 (*)    Hemoglobin 11.9 (*)    HCT 34.4 (*)    Neutro Abs 8.4 (*)    Monocytes Absolute 1.1 (*)    All other components within normal limits  COMPREHENSIVE METABOLIC PANEL - Abnormal; Notable for the following:    Glucose, Bld 148 (*)    BUN 33 (*)    Creatinine, Ser 1.60 (*)    Albumin 3.0 (*)    ALT 12 (*)    GFR calc non Af Amer 26 (*)    GFR calc Af Amer 30 (*)    All other components within normal limits  URINALYSIS, ROUTINE W REFLEX MICROSCOPIC (NOT AT [facility]) - Abnormal; Notable for the following:    APPearance CLOUDY (*)    Hgb urine dipstick MODERATE (*)  Protein, ur 30 (*)    Leukocytes, UA LARGE (*)    All other components within normal limits  URINE MICROSCOPIC-ADD ON - Abnormal; Notable for the following:    Squamous Epithelial / LPF 0-5 (*)    Bacteria, UA MANY (*)    All other components within normal limits  URINE CULTURE  CULTURE, BLOOD (ROUTINE X 2)  CULTURE, BLOOD (ROUTINE X 2)  C DIFFICILE QUICK SCREEN W PCR REFLEX  LIPASE, BLOOD  COMPREHENSIVE METABOLIC PANEL  CBC  I-STAT CG4 LACTIC ACID, ED    EKG  EKG Interpretation  Date/Time:  Thursday February 21 2016 11:21:21 EST Ventricular Rate:  105 PR Interval:    QRS Duration: 96 QT Interval:  353 QTC Calculation: 467 R Axis:   -17 Text Interpretation:  Sinus tachycardia Borderline left axis deviation Nonspecific T abnormalities, lateral leads Baseline wander in lead(s) I II aVR V4 ECG appears similar to prior No STEMI Confirmed by Sherry Ruffing MD, CHRISTOPHER [phone]) on 02/21/2016 6:31:56 PM       Radiology Dg Chest 2 View  Result Date: 02/21/2016 CLINICAL DATA:  Altered mental status  EXAM: CHEST  2 VIEW COMPARISON:  02/06/2016 FINDINGS: The heart size and mediastinal contours are within normal limits. Both lungs are clear. The visualized skeletal structures are unremarkable. IMPRESSION: No active cardiopulmonary disease. Electronically Signed   By: Kerby Moors M.D.   On: 02/21/2016 13:05   Dg Shoulder Left  Result Date: 02/21/2016 CLINICAL DATA:  Left shoulder pain EXAM: LEFT SHOULDER - 2+ VIEW COMPARISON:  None. FINDINGS: Two views of the left shoulder submitted. Significant degenerative changes with narrowing of joint space and mild gurney sclerosis noted glenohumeral joint. There is spurring of humeral head. Moderate degenerative changes AC joint. No acute fracture or subluxation IMPRESSION: No acute fracture or subluxation. Osteoarthritic changes as described above. Electronically Signed   By: Lahoma Crocker M.D.   On: 02/21/2016 16:30    Procedures Procedures (including critical care time)  Medications Ordered in ED Medications  metoprolol succinate (TOPROL-XL) 24 hr tablet 100 mg (not administered)  amLODipine (NORVASC) tablet 10 mg (not administered)  enoxaparin (LOVENOX) injection 30 mg (not administered)  sodium chloride flush (NS) 0.9 % injection 3 mL (not administered)  0.9 %  sodium chloride infusion ( Intravenous Rate/Dose Change 02/21/16 1845)  metroNIDAZOLE (FLAGYL) IVPB 500 mg (not administered)  cefTRIAXone (ROCEPHIN) 1 g in dextrose 5 % 50 mL IVPB (0 g Intravenous Stopped 02/21/16 1656)     Initial Impression / Assessment and Plan / ED Course  I have reviewed the triage vital signs and the nursing notes.  Pertinent labs & imaging results that were available during my care of the patient were reviewed by me and considered in my medical decision making (see chart for details).  Clinical Course     Jancie Santodomingo is a 82 y.o. female with a past medical history significant for diabetes, hypertension, chronic kidney disease, and chronic leg wounds with  recent admission for UTI and fatigue who presents with altered mental status, fatigue, and pain.  History and exam are seen above.  On exam, patient is alert but is only oriented to person. Patient is not oriented to place and time. Patient reports that she is having pain in her low back and her legs. On exam of these locations, patient has ulcerations on her legs. Wound dressings were dirty and appeared to be soaked in urine. On patient's skin exam otherwise, she has a mild pressure ulcer on  her low back. Patient does not have evidence of crepitance, fluctuance, or drainage in this location. Mild erythema around location. Patient's lungs were clear and abdomen was nontender.  Based on patient's symptoms of altered mental status, will have workup to look for occult infection, hydration, and electrolyte abnormalities.   Laboratory testing showed a leukocytosis, slightly worsened kidney function, and evidence of UTI. Patient given Rocephin for UTI. Blood cultures and urine cultures were obtained. Laboratory testing otherwise showed a normal lactic acid, normal lipase, and no significant electrolyte abnormalities.  Hospitalist team was called for admission due to concern for worsened kidney function, wound care, and confusion in setting of urinary tract infection. Patient admitted for further management.  Family was at the bedside and agreed with plan for admission, especially as patient now is amenable to placement for further management.    Final Clinical Impressions(s) / ED Diagnoses   Final diagnoses:  Urinary tract infection without hematuria, site unspecified    Clinical Impression: 1. Urinary tract infection without hematuria, site unspecified   2. Shoulder pain     Disposition: Admit to Hospitalist service    Courtney Paris, MD 02/21/16 [id number]    Gwenyth Allegra Tegeler, MD 02/21/16 2105

## 2016-02-22 DIAGNOSIS — N3000 Acute cystitis without hematuria: Secondary | ICD-10-CM

## 2016-02-22 DIAGNOSIS — I739 Peripheral vascular disease, unspecified: Secondary | ICD-10-CM

## 2016-02-22 DIAGNOSIS — R739 Hyperglycemia, unspecified: Secondary | ICD-10-CM

## 2016-02-22 DIAGNOSIS — I83029 Varicose veins of left lower extremity with ulcer of unspecified site: Secondary | ICD-10-CM

## 2016-02-22 DIAGNOSIS — I83019 Varicose veins of right lower extremity with ulcer of unspecified site: Secondary | ICD-10-CM

## 2016-02-22 DIAGNOSIS — L8990 Pressure ulcer of unspecified site, unspecified stage: Secondary | ICD-10-CM | POA: Insufficient documentation

## 2016-02-22 LAB — COMPREHENSIVE METABOLIC PANEL
ALT: 9 U/L — AB (ref 14–54)
AST: 28 U/L (ref 15–41)
Albumin: 2.6 g/dL — ABNORMAL LOW (ref 3.5–5.0)
Alkaline Phosphatase: 93 U/L (ref 38–126)
Anion gap: 8 (ref 5–15)
BUN: 31 mg/dL — ABNORMAL HIGH (ref 6–20)
CHLORIDE: 108 mmol/L (ref 101–111)
CO2: 22 mmol/L (ref 22–32)
Calcium: 8.6 mg/dL — ABNORMAL LOW (ref 8.9–10.3)
Creatinine, Ser: 1.42 mg/dL — ABNORMAL HIGH (ref 0.44–1.00)
GFR, EST AFRICAN AMERICAN: 34 mL/min — AB (ref >60)
GFR, EST NON AFRICAN AMERICAN: 30 mL/min — AB (ref >60)
Glucose, Bld: 209 mg/dL — ABNORMAL HIGH (ref 65–99)
POTASSIUM: 3.5 mmol/L (ref 3.5–5.1)
SODIUM: 138 mmol/L (ref 135–145)
Total Bilirubin: 0.7 mg/dL (ref 0.3–1.2)
Total Protein: 6.4 g/dL — ABNORMAL LOW (ref 6.5–8.1)

## 2016-02-22 LAB — CBC
HEMATOCRIT: 33.2 % — AB (ref 36.0–46.0)
Hemoglobin: 11 g/dL — ABNORMAL LOW (ref 12.0–15.0)
MCH: 28.9 pg (ref 26.0–34.0)
MCHC: 33.1 g/dL (ref 30.0–36.0)
MCV: 87.4 fL (ref 78.0–100.0)
Platelets: 260 10*3/uL (ref 150–400)
RBC: 3.8 MIL/uL — AB (ref 3.87–5.11)
RDW: 15.7 % — ABNORMAL HIGH (ref 11.5–15.5)
WBC: 9.1 10*3/uL (ref 4.0–10.5)

## 2016-02-22 LAB — URINE CULTURE: Culture: 60000 — AB

## 2016-02-22 MED ORDER — DEXTROSE 5 % IV SOLN
1.0000 g | INTRAVENOUS | Status: DC
  Administered 2016-02-22 – 2016-02-23 (×2): 1 g via INTRAVENOUS
  Filled 2016-02-22 (×2): qty 10

## 2016-02-22 MED ORDER — ASPIRIN EC 81 MG PO TBEC
81.0000 mg | DELAYED_RELEASE_TABLET | Freq: Every day | ORAL | Status: DC
  Administered 2016-02-22 – 2016-02-26 (×5): 81 mg via ORAL
  Filled 2016-02-22 (×5): qty 1

## 2016-02-22 MED ORDER — ATORVASTATIN CALCIUM 40 MG PO TABS
40.0000 mg | ORAL_TABLET | Freq: Every day | ORAL | Status: DC
  Administered 2016-02-22 – 2016-02-25 (×4): 40 mg via ORAL
  Filled 2016-02-22 (×4): qty 1

## 2016-02-22 MED ORDER — TRAMADOL HCL 50 MG PO TABS
50.0000 mg | ORAL_TABLET | Freq: Once | ORAL | Status: AC
  Administered 2016-02-23: 50 mg via ORAL
  Filled 2016-02-22: qty 1

## 2016-02-22 NOTE — Progress Notes (Signed)
Inpatient Diabetes Program Recommendations  AACE/ADA: New Consensus Statement on Inpatient Glycemic Control (2015)  Target Ranges:  Prepandial:   less than 140 mg/dL      Peak postprandial:   less than 180 mg/dL (1-2 hours)      Critically ill patients:  140 - 180 mg/dL   Results for BITA, WAGLE (MRN [id number]) as of 02/22/2016 08:54  Ref. Range 02/21/2016 12:19 02/22/2016 03:49  Glucose Latest Ref Range: 65 - 99 mg/dL 148 (H) 209 (H)    Admit with: Sepsis  History: DM2, CKD  Home DM Meds: None listed  Current Insulin Orders: None yet      MD- Patient noted to have History of DM from previous admissions.  Please consider starting Novolog Sensitive Correction Scale/ SSI (0-9 units) TID AC + HS      --Will follow patient during hospitalization--  Wyn Quaker RN, MSN, CDE Diabetes Coordinator Inpatient Glycemic Control Team Team Pager: [phone] (8a-5p)

## 2016-02-22 NOTE — Consult Note (Signed)
[facility] Nurse wound consult note Reason for Consult: Patient consult received for LE wounds, reepithelializing.  Also consulted for severe incontinence associated dermatitis (IAD), intertriginous dermatitis in the sub pannicular region. This patient was seen by my partners approximately 2 weeks ago. Wound type:Venopus insufficiency, moisture associated skin damage Pressure Ulcer POA: Yes/No Measurement:RLE:  3cm x 0.6cm x 0.1cm  Pink, moist, granulating.  Minimal serous drainage.   LLE:  2cm x 1cm x 0.2cm, red, moist wound bed, scant serous exudate.  Posterior thighs, medial thighs and buttocks:  Diffuse erythema with scattered small lesions consistent with presentation of IAD. Pink, shallow with scant serous exudate.  Sub pannicular area, right:  0.2cm x 8cm x 0.1cm moist, pink wound bed with scant serous exudate. Wound bed:As described above Drainage (amount, consistency, odor) As described above Periwound: intact, dry.  Bialteral LEs with hyperkeratotic areas and plaque presentations. Dressing procedure/placement/frequency: I have provided patient with a mattress replacement today as a management strategy for her IAD (correction of microclimate); it is noted that she is quite reluctant to turn and reposition and will not do so independently. Additionally, I have provided conservative orders for local wound care to the bilateral LEs;  Patient wears Unnas' Boots at home because her LEs are dependent most of the day and night.  As her LEs are currently elevated and she is in bed with urinary incontinence, I will not place Unna's Boots at this time.  If it is decided to resume this therapy at home, I can agree with and support that decision.  For the ITD, I have provided Nursing with guidance via the Orders for use of our house antimicrobial textile for wicking moisture out of the sub pannicular skin fold. Bilateral pressure redistribution heel boots are provided to float heels while in bed.  [facility] nursing team  will not follow, but will remain available to this patient, the nursing and medical teams.  Please re-consult if needed. Thanks, Maudie Flakes, MSN, RN, [place], Arther Abbott  Pager# [phone]

## 2016-02-22 NOTE — Progress Notes (Signed)
No air mattresses currently available per [employer].  Will pass on to night shift RN to call again at the start of her shift and check for mattresses at that time, per [employer].

## 2016-02-22 NOTE — Care Management Note (Signed)
Case Management Note  Patient Details  Name: Jill Allen MRN: [id number] Date of Birth: January 31, 1916  Subjective/Objective: 82 y/o f admitted w/Sepsis. [id number] venous stasis ulcers, gout,htn, w/c bound. Readmit-10/25-10/28/17-UTI. Last admission noted-Was not appropriate for CIR;declined SNF;d/c home w/HHC-[facility].  Has w/c. Not appropriate for PT cons-w/c bound.  [facility] rep Octavia Bruckner states-they attempted to reach w/no calls back, patient declined visits, they made unannounced visits-no one @ door. There is a HCPOA-Donald tatum [phone]-he says he is in the [employer] & was not @ home-states patient is home alone, but Coqui make visits during the day. WOC cons-await recc. [facility] rep Octavia Bruckner will reach out to nephew Elenore Rota, [facility] willing to take back with an available caregiver in place. Would recc-HHRN/HH social worker/aide,[id number] @ d/c.                   Action/Plan:d/c home w/HHC.   Expected Discharge Date:   (unknown)               Expected Discharge Plan:  [place]  In-House Referral:     Discharge planning Services     Post Acute Care Choice:  [place], [place] ([facility]-HHRN, has w/c) Choice offered to:     DME Arranged:    DME Agency:     HH Arranged:    HH Agency:     Status of Service:  In process, will continue to follow  If discussed at Long Length of Stay Meetings, dates discussed:    Additional Comments:  Dessa Phi, RN 02/22/2016, 1:27 PM

## 2016-02-22 NOTE — Progress Notes (Signed)
PROGRESS NOTE    Jill Allen  [id number] DOB: [birth year] DOA: 02/21/2016 PCP: [name], MD   Brief Narrative: Patient is a 82 year old AAF with a PMH of Know Venous Stasis Ulcers followed by Dr. Bridgett Larsson, HTN, Gout and other comorbidities who was just recently admitted for AKI, Weakness, and Yeast UTI. It was recommended for her to go to SNF but elected to go home instead. She comes in more confused this time and reported feeling nauseous. In the ER it was reported that she was covered in stool and had diarrhea but has not had any more episodes. UA showed possible UTI. She was admitted for possible Pyelo vs. Diarrheal Disease but Upon seeing the patient today, diarrhea has ceased and it appears she may have a UTI.   Assessment & Plan:   Active Problems:   Sepsis ([place])   Pressure injury of skin  UTI poA -Urine Cx pending -Continue with Rocephin\ -More lucid today  Lower Extermity Ulcers/PAD -Wound Care Nurse consultation and Recc's appreciated. -Follows with Dr. Bridgett Larsson as an outpatient  CKD Stage 3 -Patient's BUN/Cr went from 33/1/60 -> 31/1.42 -Repeat BMP in AM  Gout -Left shoulder X-Ray showed No acute fracture or subluxation. Osteoarthritic changes.  Diarrhea  -Seems Resolved. -D/C'd Flagyl  -If continues will obtain C Diff testing and start on po Vanc  Hyperglycemia -Will continue to Monitor CBG's and if increased will start SSI  ? Neglect -Education officer, museum to [employer] and will find out more information  DVT prophylaxis:  Code Status: DNR Family Communication: Discussed with Nephew who is HCPOA at bedside Disposition Plan: Pending PT Eval  Consultants:   None  Procedures: None  Antimicrobials: IV Rocephin  Subjective: Seen and examined at bedside. Had no complaints. Stated that diarrhea stopped. Had some pain when manipulating legs to check chronic ulcers.   Objective: Vitals:   02/21/16 1827 02/21/16 2140 02/22/16 0706 02/22/16 1300  BP:  (!) 153/98 (!) 152/75 (!) 161/80 139/72  Pulse: 98 99 79 77  Resp: 16 20 18 18   Temp: 98.5 F (36.9 C) 98 F (36.7 C) 99 F (37.2 C) 98.1 F (36.7 C)  TempSrc: Oral Axillary Oral Oral  SpO2: 100% 98% 100% 100%  Weight: 86.1 kg (189 lb 12.8 oz)     Height: 5\' 3"  (1.6 m)       Intake/Output Summary (Last 24 hours) at 02/22/16 2029 Last data filed at 02/22/16 1900  Gross per 24 hour  Intake          1143.75 ml  Output                0 ml  Net          1143.75 ml   Filed Weights   02/21/16 1119 02/21/16 1120 02/21/16 1827  Weight: 93 kg (205 lb) 93 kg (205 lb) 86.1 kg (189 lb 12.8 oz)    Examination: Physical Exam:  Constitutional: WN/WD, NAD and appears calm and comfortable Eyes: Lids and conjunctivae normal, sclerae anicteric  ENMT: External Ears, Nose appear normal. Grossly normal hearing.  Neck: Appears normal, supple, no cervical masses, normal ROM, no appreciable thyromegaly Respiratory: Clear to auscultation bilaterally, no wheezing, rales, rhonchi or crackles. Normal respiratory effort and patient is not tachypenic. No accessory muscle use.  Cardiovascular: RRR, no murmurs / rubs / gallops. S1 and S2 auscultated. No extremity edema. Abdomen: Soft, non-tender, non-distended. No masses palpated. No appreciable hepatosplenomegaly. Bowel sounds positive x4.  GU: Deferred. Musculoskeletal:  No clubbing / cyanosis of digits/nails. No joint deformity upper and lower extremities.  Skin: B/L LE with Ulcers that are wrapped. Wrapping revealed scaly ulcerations. Unable to turn to view Sacral Decubitus Ulcer.  Neurologic: CN 2-12 grossly intact with no focal deficits. Sensation intact in all 4 Extremities. Romberg sign cerebellar reflexes not assessed.  Psychiatric: Normal judgment and insight. Awake but not oriented. Normal mood and appropriate affect.   Data Reviewed: I have personally reviewed following labs and imaging studies  CBC:  Recent Labs Lab 02/21/16 1219  02/22/16 0349  WBC 10.7* 9.1  NEUTROABS 8.4*  --   HGB 11.9* 11.0*  HCT 34.4* 33.2*  MCV 85.6 87.4  PLT 285 [id number]   Basic Metabolic Panel:  Recent Labs Lab 02/21/16 1219 02/22/16 0349  NA 137 138  K 4.0 3.5  CL 105 108  CO2 24 22  GLUCOSE 148* 209*  BUN 33* 31*  CREATININE 1.60* 1.42*  CALCIUM 9.0 8.6*   GFR: Estimated Creatinine Clearance: 23 mL/min (by C-G formula based on SCr of 1.42 mg/dL (H)). Liver Function Tests:  Recent Labs Lab 02/21/16 1219 02/22/16 0349  AST 34 28  ALT 12* 9*  ALKPHOS 105 93  BILITOT 0.9 0.7  PROT 7.7 6.4*  ALBUMIN 3.0* 2.6*    Recent Labs Lab 02/21/16 1219  LIPASE 20   No results for input(s): AMMONIA in the last 168 hours. Coagulation Profile: No results for input(s): INR, PROTIME in the last 168 hours. Cardiac Enzymes: No results for input(s): CKTOTAL, CKMB, CKMBINDEX, TROPONINI in the last 168 hours. BNP (last 3 results) No results for input(s): PROBNP in the last 8760 hours. HbA1C: No results for input(s): HGBA1C in the last 72 hours. CBG: No results for input(s): GLUCAP in the last 168 hours. Lipid Profile: No results for input(s): CHOL, HDL, LDLCALC, TRIG, CHOLHDL, LDLDIRECT in the last 72 hours. Thyroid Function Tests: No results for input(s): TSH, T4TOTAL, FREET4, T3FREE, THYROIDAB in the last 72 hours. Anemia Panel: No results for input(s): VITAMINB12, FOLATE, FERRITIN, TIBC, IRON, RETICCTPCT in the last 72 hours. Sepsis Labs:  Recent Labs Lab 02/21/16 1230  LATICACIDVEN 1.29    Recent Results (from the past 240 hour(s))  Blood culture (routine x 2)     Status: None (Preliminary result)   Collection Time: 02/21/16 12:00 PM  Result Value Ref Range Status   Specimen Description BLOOD RIGHT ANTECUBITAL  Final   Special Requests IN PEDIATRIC BOTTLE 3CC  Final   Culture   Final    NO GROWTH < 24 HOURS Performed at [facility]    Report Status PENDING  Incomplete  Blood culture (routine x 2)      Status: None (Preliminary result)   Collection Time: 02/21/16 12:15 PM  Result Value Ref Range Status   Specimen Description BLOOD LEFT ANTECUBITAL  Final   Special Requests BOTTLES DRAWN AEROBIC AND ANAEROBIC 5CC  Final   Culture   Final    NO GROWTH < 24 HOURS Performed at [facility]    Report Status PENDING  Incomplete  Urine culture     Status: Abnormal   Collection Time: 02/21/16 12:34 PM  Result Value Ref Range Status   Specimen Description URINE, CATHETERIZED  Final   Special Requests NONE  Final   Culture 60,000 COLONIES/mL YEAST (A)  Final   Report Status 02/22/2016 FINAL  Final    Radiology Studies: Dg Chest 2 View  Result Date: 02/21/2016 CLINICAL DATA:  Altered mental status  EXAM: CHEST  2 VIEW COMPARISON:  02/06/2016 FINDINGS: The heart size and mediastinal contours are within normal limits. Both lungs are clear. The visualized skeletal structures are unremarkable. IMPRESSION: No active cardiopulmonary disease. Electronically Signed   By: Kerby Moors M.D.   On: 02/21/2016 13:05   Dg Shoulder Left  Result Date: 02/21/2016 CLINICAL DATA:  Left shoulder pain EXAM: LEFT SHOULDER - 2+ VIEW COMPARISON:  None. FINDINGS: Two views of the left shoulder submitted. Significant degenerative changes with narrowing of joint space and mild gurney sclerosis noted glenohumeral joint. There is spurring of humeral head. Moderate degenerative changes AC joint. No acute fracture or subluxation IMPRESSION: No acute fracture or subluxation. Osteoarthritic changes as described above. Electronically Signed   By: Lahoma Crocker M.D.   On: 02/21/2016 16:30   Scheduled Meds: . amLODipine  10 mg Oral Daily  . aspirin EC  81 mg Oral Daily  . atorvastatin  40 mg Oral q1800  . cefTRIAXone (ROCEPHIN)  IV  1 g Intravenous Q24H  . enoxaparin (LOVENOX) injection  30 mg Subcutaneous Q24H  . metoprolol succinate  100 mg Oral Daily  . sodium chloride flush  3 mL Intravenous Q12H   Continuous  Infusions: . sodium chloride 75 mL/hr at 02/21/16 1845     LOS: 1 day   Kerney Elbe, DO [facility] Pager [phone]  If 7PM-7AM, please contact night-coverage [email or web address] Password [email or web address] 02/22/2016, 8:29 PM

## 2016-02-23 DIAGNOSIS — B3749 Other urogenital candidiasis: Secondary | ICD-10-CM

## 2016-02-23 DIAGNOSIS — D72829 Elevated white blood cell count, unspecified: Secondary | ICD-10-CM

## 2016-02-23 LAB — CBC WITH DIFFERENTIAL/PLATELET
BASOS PCT: 0 %
Basophils Absolute: 0 10*3/uL (ref 0.0–0.1)
EOS ABS: 0 10*3/uL (ref 0.0–0.7)
Eosinophils Relative: 0 %
HCT: 32 % — ABNORMAL LOW (ref 36.0–46.0)
HEMOGLOBIN: 10.5 g/dL — AB (ref 12.0–15.0)
Lymphocytes Relative: 13 %
Lymphs Abs: 1.4 10*3/uL (ref 0.7–4.0)
MCH: 28.8 pg (ref 26.0–34.0)
MCHC: 32.8 g/dL (ref 30.0–36.0)
MCV: 87.7 fL (ref 78.0–100.0)
MONO ABS: 1.5 10*3/uL — AB (ref 0.1–1.0)
MONOS PCT: 13 %
NEUTROS PCT: 74 %
Neutro Abs: 8.1 10*3/uL — ABNORMAL HIGH (ref 1.7–7.7)
PLATELETS: 275 10*3/uL (ref 150–400)
RBC: 3.65 MIL/uL — ABNORMAL LOW (ref 3.87–5.11)
RDW: 15.6 % — AB (ref 11.5–15.5)
WBC: 11 10*3/uL — ABNORMAL HIGH (ref 4.0–10.5)

## 2016-02-23 LAB — COMPREHENSIVE METABOLIC PANEL
ALT: 9 U/L — ABNORMAL LOW (ref 14–54)
ANION GAP: 9 (ref 5–15)
AST: 27 U/L (ref 15–41)
Albumin: 2.4 g/dL — ABNORMAL LOW (ref 3.5–5.0)
Alkaline Phosphatase: 99 U/L (ref 38–126)
BUN: 29 mg/dL — ABNORMAL HIGH (ref 6–20)
CHLORIDE: 108 mmol/L (ref 101–111)
CO2: 22 mmol/L (ref 22–32)
Calcium: 8.1 mg/dL — ABNORMAL LOW (ref 8.9–10.3)
Creatinine, Ser: 1.4 mg/dL — ABNORMAL HIGH (ref 0.44–1.00)
GFR, EST AFRICAN AMERICAN: 35 mL/min — AB (ref >60)
GFR, EST NON AFRICAN AMERICAN: 30 mL/min — AB (ref >60)
Glucose, Bld: 182 mg/dL — ABNORMAL HIGH (ref 65–99)
Potassium: 3.4 mmol/L — ABNORMAL LOW (ref 3.5–5.1)
Sodium: 139 mmol/L (ref 135–145)
Total Bilirubin: 0.6 mg/dL (ref 0.3–1.2)
Total Protein: 6.2 g/dL — ABNORMAL LOW (ref 6.5–8.1)

## 2016-02-23 LAB — PHOSPHORUS: PHOSPHORUS: 3.4 mg/dL (ref 2.5–4.6)

## 2016-02-23 LAB — MAGNESIUM: MAGNESIUM: 1.8 mg/dL (ref 1.7–2.4)

## 2016-02-23 MED ORDER — FLUCONAZOLE 100 MG PO TABS
100.0000 mg | ORAL_TABLET | ORAL | Status: DC
  Administered 2016-02-24 – 2016-02-25 (×2): 100 mg via ORAL
  Filled 2016-02-23 (×2): qty 1

## 2016-02-23 MED ORDER — TRAMADOL HCL 50 MG PO TABS
50.0000 mg | ORAL_TABLET | Freq: Four times a day (QID) | ORAL | Status: DC | PRN
  Administered 2016-02-23 – 2016-02-26 (×8): 50 mg via ORAL
  Filled 2016-02-23 (×8): qty 1

## 2016-02-23 MED ORDER — FLUCONAZOLE IN SODIUM CHLORIDE 200-0.9 MG/100ML-% IV SOLN
200.0000 mg | Freq: Once | INTRAVENOUS | Status: AC
  Administered 2016-02-23: 200 mg via INTRAVENOUS
  Filled 2016-02-23: qty 100

## 2016-02-23 MED ORDER — POTASSIUM CHLORIDE CRYS ER 20 MEQ PO TBCR
40.0000 meq | EXTENDED_RELEASE_TABLET | Freq: Two times a day (BID) | ORAL | Status: AC
  Administered 2016-02-23 (×2): 40 meq via ORAL
  Filled 2016-02-23 (×2): qty 2

## 2016-02-23 NOTE — Progress Notes (Signed)
RN and NT spent over an hour performing skin care to patient's BLE.  Both legs dry, skin flaking off and venous stasis ulcers present to both legs. Skin care performed per orders left by [facility] RN. It takes 6-8 Vaseline gauze per leg to cover. Patient also continues to complain of pain in her arms, especially around left wrist area.  Notified portable equipment that an air overlay mattress is needed; none available at this time. Will continue to monitor patient.

## 2016-02-23 NOTE — Progress Notes (Signed)
PROGRESS NOTE    Jill Allen  [id number] DOB: 1916-02-08 DOA: 02/21/2016 PCP: [name], MD   Brief Narrative: Patient is a 82 year old AAF with a PMH of Know Venous Stasis Ulcers followed by Dr. Bridgett Larsson, HTN, Gout and other comorbidities who was just recently admitted for AKI, Weakness, and Yeast UTI. It was recommended for her to go to SNF but elected to go home instead. She comes in more confused this time and reported feeling nauseous. In the ER it was reported that she was covered in stool and had diarrhea but has not had any more episodes. UA showed possible UTI. She was admitted for possible Pyelo vs. Diarrheal Disease but Upon seeing the patient today, diarrhea has ceased and it appears she may have a UTI. Will obtain PT/OT to Eval and Treat.   Assessment & Plan:   Active Problems:   Sepsis ([place])   Pressure injury of skin  Generalized Weakness and Deconditioning -Will obtain PT and OT Evaluation  Yeast UTI poA -Urine Cx showed 60,00 CFU of Yeast -D/C Rocephin; Will Start Fluconazole -More lucid today  Lower Venous Extermity Ulcers -Wound Care Nurse consultation and Recc's appreciated. -Will need Air Mattress Overlay -Follows with Dr. Bridgett Larsson as an outpatient  Mild Leukocytosis -Continue to Monitor  CKD Stage 3 -Patient's BUN/Cr went from 33/1/60 -> 31/1.42  -> 29/1.40 -Repeat BMP in AM  Gout -Left shoulder X-Ray showed No acute fracture or subluxation. Osteoarthritic changes. -Currently not in a Flare  Diarrhea  -Seems Resolved. -D/C'd Flagyl  -If continues will obtain C Diff testing and start on po Vanc  Hyperglycemia -BS on BMP was 152 -Will continue to Monitor CBG's and if increased will start SSI  ? [place] never came to see Patient -Will obtain PT/OT Consult  DVT prophylaxis: Lovenox 30 Code Status: DNR Family Communication: Discussed with Nephew who is HCPOA at bedside Disposition Plan: Pending PT Eval  Consultants:    None  Procedures: None  Antimicrobials: D/C'd IV Rocephin; Started Fluconazole (pharmacy to Dose)  Subjective: Seen and examined at bedside and she was lucid and was alert and oriented x3. She knew she was in a [employer], told me the Day correctly and Month. States her Ulcers hurt her. No other complaints except that she wants an additional pillow.    Objective: Vitals:   02/22/16 1300 02/22/16 2153 02/23/16 0618 02/23/16 1325  BP: 139/72 (!) 149/69 138/67 (!) 146/77  Pulse: 77 78 73   Resp: 18 20 20 20   Temp: 98.1 F (36.7 C) 98.5 F (36.9 C) 98.3 F (36.8 C) 98.2 F (36.8 C)  TempSrc: Oral Oral Oral Oral  SpO2: 100% 99% 99% 100%  Weight:      Height:        Intake/Output Summary (Last 24 hours) at 02/23/16 1839 Last data filed at 02/23/16 1000  Gross per 24 hour  Intake             2200 ml  Output                0 ml  Net             2200 ml   Filed Weights   02/21/16 1119 02/21/16 1120 02/21/16 1827  Weight: 93 kg (205 lb) 93 kg (205 lb) 86.1 kg (189 lb 12.8 oz)    Examination: Physical Exam:  Constitutional: WN/WD, NAD and appears calm and comfortable Eyes: Lids and conjunctivae normal, sclerae anicteric  ENMT: External Ears, Nose appear normal. Grossly normal hearing.  Neck: Appears normal, supple, no cervical masses, normal ROM, no appreciable thyromegaly Respiratory: Clear to auscultation bilaterally, no wheezing, rales, rhonchi or crackles. Normal respiratory effort and patient is not tachypenic. No accessory muscle use.  Cardiovascular: RRR, no murmurs / rubs / gallops. S1 and S2 auscultated. No extremity edema. Abdomen: Soft, non-tender, non-distended. No masses palpated. No appreciable hepatosplenomegaly. Bowel sounds positive x4.  GU: Deferred. Musculoskeletal: No clubbing / cyanosis of digits/nails. No joint deformity upper and lower extremities.  Skin: B/L LE with Ulcers that are wrapped and in boots. Wrapping revealed scaly ulcerations. Unable  to turn to view Sacral Decubitus Ulcer.  Neurologic: CN 2-12 grossly intact with no focal deficits. Sensation intact in all 4 Extremities. Romberg sign cerebellar reflexes not assessed.  Psychiatric: Normal judgment and insight. Awake and alert and oriented x3. Normal mood and appropriate affect.   Data Reviewed: I have personally reviewed following labs and imaging studies  CBC:  Recent Labs Lab 02/21/16 1219 02/22/16 0349 02/23/16 0502  WBC 10.7* 9.1 11.0*  NEUTROABS 8.4*  --  8.1*  HGB 11.9* 11.0* 10.5*  HCT 34.4* 33.2* 32.0*  MCV 85.6 87.4 87.7  PLT 285 260 [id number]   Basic Metabolic Panel:  Recent Labs Lab 02/21/16 1219 02/22/16 0349 02/23/16 0502  NA 137 138 139  K 4.0 3.5 3.4*  CL 105 108 108  CO2 24 22 22   GLUCOSE 148* 209* 182*  BUN 33* 31* 29*  CREATININE 1.60* 1.42* 1.40*  CALCIUM 9.0 8.6* 8.1*  MG  --   --  1.8  PHOS  --   --  3.4   GFR: Estimated Creatinine Clearance: 23.3 mL/min (by C-G formula based on SCr of 1.4 mg/dL (H)). Liver Function Tests:  Recent Labs Lab 02/21/16 1219 02/22/16 0349 02/23/16 0502  AST 34 28 27  ALT 12* 9* 9*  ALKPHOS 105 93 99  BILITOT 0.9 0.7 0.6  PROT 7.7 6.4* 6.2*  ALBUMIN 3.0* 2.6* 2.4*    Recent Labs Lab 02/21/16 1219  LIPASE 20   No results for input(s): AMMONIA in the last 168 hours. Coagulation Profile: No results for input(s): INR, PROTIME in the last 168 hours. Cardiac Enzymes: No results for input(s): CKTOTAL, CKMB, CKMBINDEX, TROPONINI in the last 168 hours. BNP (last 3 results) No results for input(s): PROBNP in the last 8760 hours. HbA1C: No results for input(s): HGBA1C in the last 72 hours. CBG: No results for input(s): GLUCAP in the last 168 hours. Lipid Profile: No results for input(s): CHOL, HDL, LDLCALC, TRIG, CHOLHDL, LDLDIRECT in the last 72 hours. Thyroid Function Tests: No results for input(s): TSH, T4TOTAL, FREET4, T3FREE, THYROIDAB in the last 72 hours. Anemia Panel: No results for  input(s): VITAMINB12, FOLATE, FERRITIN, TIBC, IRON, RETICCTPCT in the last 72 hours. Sepsis Labs:  Recent Labs Lab 02/21/16 1230  LATICACIDVEN 1.29    Recent Results (from the past 240 hour(s))  Blood culture (routine x 2)     Status: None (Preliminary result)   Collection Time: 02/21/16 12:00 PM  Result Value Ref Range Status   Specimen Description BLOOD RIGHT ANTECUBITAL  Final   Special Requests IN PEDIATRIC BOTTLE 3CC  Final   Culture   Final    NO GROWTH 2 DAYS Performed at [facility]    Report Status PENDING  Incomplete  Blood culture (routine x 2)     Status: None (Preliminary result)   Collection Time: 02/21/16 12:15 PM  Result Value Ref Range Status   Specimen Description BLOOD LEFT ANTECUBITAL  Final   Special Requests BOTTLES DRAWN AEROBIC AND ANAEROBIC 5CC  Final   Culture   Final    NO GROWTH 2 DAYS Performed at [facility]    Report Status PENDING  Incomplete  Urine culture     Status: Abnormal   Collection Time: 02/21/16 12:34 PM  Result Value Ref Range Status   Specimen Description URINE, CATHETERIZED  Final   Special Requests NONE  Final   Culture 60,000 COLONIES/mL YEAST (A)  Final   Report Status 02/22/2016 FINAL  Final    Radiology Studies: No results found. Scheduled Meds: . amLODipine  10 mg Oral Daily  . aspirin EC  81 mg Oral Daily  . atorvastatin  40 mg Oral q1800  . cefTRIAXone (ROCEPHIN)  IV  1 g Intravenous Q24H  . enoxaparin (LOVENOX) injection  30 mg Subcutaneous Q24H  . metoprolol succinate  100 mg Oral Daily  . potassium chloride SA  40 mEq Oral BID  . sodium chloride flush  3 mL Intravenous Q12H   Continuous Infusions: . sodium chloride 75 mL/hr at 02/23/16 0824    LOS: 2 days   Kerney Elbe, DO [facility] Pager [phone]  If 7PM-7AM, please contact night-coverage [email or web address] Password [facility] 02/23/2016, 6:39 PM

## 2016-02-23 NOTE — Progress Notes (Signed)
Pharmacy Antibiotic Note  Jill Allen is a 82 y.o. female admitted on 02/21/2016 with UTI, urine cultures growing 60k colonies yeast. Pharmacy has been consulted for fluconazole dosing.  Plan:  Fluconazole 200mg  IV x 1 dose, then 100mg  PO daily for CrCl < 50 ml/min  Monitor renal function and adjust dose as needed  Follow up final culture data  Height: 5\' 3"  (160 cm) Weight: 189 lb 12.8 oz (86.1 kg) IBW/kg (Calculated) : 52.4  Temp (24hrs), Avg:98.3 F (36.8 C), Min:98.2 F (36.8 C), Max:98.5 F (36.9 C)   Recent Labs Lab 02/21/16 1219 02/21/16 1230 02/22/16 0349 02/23/16 0502  WBC 10.7*  --  9.1 11.0*  CREATININE 1.60*  --  1.42* 1.40*  LATICACIDVEN  --  1.29  --   --     Estimated Creatinine Clearance: 23.3 mL/min (by C-G formula based on SCr of 1.4 mg/dL (H)).    No Known Allergies  Antimicrobials this admission:  11/9 Ceftriaxone >> 11/11 11/9 Flagyl IV >> 11/10  11/11 Fluconazole >>  Dose adjustments this admission:  ---  Microbiology results:  11/9 BCx x2: ngtd 11/9 UCx: 60k yeast  Thank you for allowing pharmacy to be a part of this patient's care.  Peggyann Juba, PharmD, BCPS Pager: [phone] 02/23/2016 6:51 PM

## 2016-02-24 DIAGNOSIS — R531 Weakness: Secondary | ICD-10-CM

## 2016-02-24 LAB — CBC WITH DIFFERENTIAL/PLATELET
Basophils Absolute: 0 10*3/uL (ref 0.0–0.1)
Basophils Relative: 0 %
EOS ABS: 0.1 10*3/uL (ref 0.0–0.7)
EOS PCT: 1 %
HCT: 32.7 % — ABNORMAL LOW (ref 36.0–46.0)
HEMOGLOBIN: 10.6 g/dL — AB (ref 12.0–15.0)
LYMPHS ABS: 1.2 10*3/uL (ref 0.7–4.0)
LYMPHS PCT: 12 %
MCH: 28.9 pg (ref 26.0–34.0)
MCHC: 32.4 g/dL (ref 30.0–36.0)
MCV: 89.1 fL (ref 78.0–100.0)
MONOS PCT: 13 %
Monocytes Absolute: 1.4 10*3/uL — ABNORMAL HIGH (ref 0.1–1.0)
Neutro Abs: 7.5 10*3/uL (ref 1.7–7.7)
Neutrophils Relative %: 74 %
Platelets: 296 10*3/uL (ref 150–400)
RBC: 3.67 MIL/uL — AB (ref 3.87–5.11)
RDW: 15.8 % — ABNORMAL HIGH (ref 11.5–15.5)
WBC: 10.2 10*3/uL (ref 4.0–10.5)

## 2016-02-24 LAB — COMPREHENSIVE METABOLIC PANEL
ALK PHOS: 81 U/L (ref 38–126)
ALT: 9 U/L — AB (ref 14–54)
ANION GAP: 6 (ref 5–15)
AST: 24 U/L (ref 15–41)
Albumin: 2.3 g/dL — ABNORMAL LOW (ref 3.5–5.0)
BUN: 27 mg/dL — ABNORMAL HIGH (ref 6–20)
CO2: 19 mmol/L — ABNORMAL LOW (ref 22–32)
CREATININE: 1.51 mg/dL — AB (ref 0.44–1.00)
Calcium: 7.9 mg/dL — ABNORMAL LOW (ref 8.9–10.3)
Chloride: 110 mmol/L (ref 101–111)
GFR, EST AFRICAN AMERICAN: 32 mL/min — AB (ref >60)
GFR, EST NON AFRICAN AMERICAN: 28 mL/min — AB (ref >60)
Glucose, Bld: 140 mg/dL — ABNORMAL HIGH (ref 65–99)
Potassium: 4.2 mmol/L (ref 3.5–5.1)
Sodium: 135 mmol/L (ref 135–145)
TOTAL PROTEIN: 6.2 g/dL — AB (ref 6.5–8.1)
Total Bilirubin: 0.5 mg/dL (ref 0.3–1.2)

## 2016-02-24 LAB — PHOSPHORUS: PHOSPHORUS: 2.9 mg/dL (ref 2.5–4.6)

## 2016-02-24 LAB — MAGNESIUM: MAGNESIUM: 1.7 mg/dL (ref 1.7–2.4)

## 2016-02-24 NOTE — Evaluation (Signed)
Physical Therapy Evaluation Patient Details Name: Korrin Habegger MRN: [id number] DOB: 1915/07/31 Today's Date: 02/24/2016   History of Present Illness  82 yo female admitted with sepsis. Hx of HTN, venous stasis with ulcers LEs, DM, CKD, PAD  Clinical Impression  Bed level eval. Mobility limited by pain and weakness. Total assist +2 for all attempts at mobility. Will continue to follow and progress activity as tolerated. Recommend SNF.     Follow Up Recommendations SNF    Equipment Recommendations  None recommended by PT    Recommendations for Other Services OT consult     Precautions / Restrictions Precautions Precautions: Fall Restrictions Weight Bearing Restrictions: No      Mobility  Bed Mobility Overal bed mobility: Needs Assistance;+2 for physical assistance;+ 2 for safety/equipment Bed Mobility: Rolling Rolling: Total assist;+2 for physical assistance;+2 for safety/equipment         General bed mobility comments: Pt only able to tolerate ~1/4 turn roll towards L side. Utilized bedpad to assist.   Transfers                 General transfer comment: NT-unable due to pain, weakness  Ambulation/Gait                Stairs            Wheelchair Mobility    Modified Rankin (Stroke Patients Only)       Balance                                             Pertinent Vitals/Pain Pain Assessment: Faces Faces Pain Scale: Hurts worst Pain Location: "joints" Pain Descriptors / Indicators: Sore;Tender;Grimacing Pain Intervention(s): Limited activity within patient's tolerance    Home Living Family/patient expects to be discharged to:: Unsure   Available Help at Discharge: Family;Friend(s);Available PRN/intermittently Type of Home: Apartment Home Access: Level entry     Home Layout: One level Home Equipment: Wheelchair - manual      Prior Function     Gait / Transfers Assistance Needed: Patient is able to transfer  into/out of w/c on her own at baseline.  Propels w/c with feet per patient.  ADL's / Homemaking Assistance Needed: Patient takes sponge bath. Niece does housekeeping. [employer] and gets food "from [employer]        [employer]        Extremity/Trunk Assessment   Upper Extremity Assessment: Defer to OT evaluation           Lower Extremity Assessment: Generalized weakness;RLE deficits/detail;LLE deficits/detail         Communication   Communication: No difficulties  Cognition Arousal/Alertness: Awake/alert Behavior During Therapy: WFL for tasks assessed/performed Overall Cognitive Status: Within Functional Limits for tasks assessed                      General Comments      Exercises General Exercises - Lower Extremity Ankle Circles/Pumps: AROM; Other (comment);Supine (pt performed very small ROM. 5 reps ) Hip Flexion/Marching: AAROM;Other (comment) (very small ROm-limited by pain, weakness. 2 reps)   Assessment/Plan    PT Assessment Patient needs continued PT services  PT Problem List Decreased strength;Decreased mobility;Decreased range of motion;Decreased activity tolerance;Decreased balance;Pain;Decreased skin integrity;Decreased knowledge of use of DME          PT Treatment Interventions DME instruction;Functional mobility training;Therapeutic activities;Patient/family education;Therapeutic exercise;Balance  training;Wheelchair mobility training    PT Goals (Current goals can be found in the Care Plan section)  Acute Rehab PT Goals Patient Stated Goal: less pain PT Goal Formulation: With patient Time For Goal Achievement: 03/09/16 Potential to Achieve Goals: Fair    Frequency Min 2X/week   Barriers to discharge        Co-evaluation               End of Session   Activity Tolerance: Patient limited by pain;Patient limited by fatigue Patient left: in bed;with call bell/phone within reach;with bed alarm set            Time: [id number] PT Time Calculation (min) (ACUTE ONLY): 21 min   Charges:   PT Evaluation $PT Eval Low Complexity: 1 Procedure     PT G Codes:        Weston Anna, MPT Pager: [phone]

## 2016-02-24 NOTE — Progress Notes (Signed)
PROGRESS NOTE    Jill Allen  [id number] DOB: 1915/08/19 DOA: 02/21/2016 PCP: [name], MD   Brief Narrative: Patient is a 82 year old AAF with a PMH of Know Venous Stasis Ulcers followed by Dr. Bridgett Larsson, HTN, Gout and other comorbidities who was just recently admitted for AKI, Weakness, and Yeast UTI. It was recommended for her to go to SNF but elected to go home instead. She comes in more confused this time and reported feeling nauseous. In the ER it was reported that she was covered in stool and had diarrhea but has not had any more episodes. UA showed possible UTI. She was admitted for possible Pyelo vs. Diarrheal Disease but Upon seeing the patient today, diarrhea has ceased and it appears she may have a UTI. PT/OT Evaluated and Recommended SNF. Will consult Social Worker for SNF placement.  Assessment & Plan:   Active Problems:   Sepsis ([place])   Pressure injury of skin  Generalized Weakness and Deconditioning -PT and OT Evaluated and Treated -Recommend SNF -Consulted Education officer, museum for SNF Placement  Yeast UTI poA -Urine Cx showed 60,00 CFU of Yeast -C/w Fluconazole 200 mg IV x 1 dose and then 100 mg po Daily because of CrCl <50  Lower Venous Extermity Ulcers -Wound Care Nurse consultation and Recc's appreciated. -Will need Air Mattress Overlay -Follows with Dr. Bridgett Larsson as an outpatient  Mild Leukocytosis -Improved. Patient's WBC went from 11.0 -> 10.2  CKD Stage 3 -Patient's BUN/Cr went from 33/1/60 -> 31/1.42  -> 29/1.40 -> 27/1.51 -Repeat BMP in AM  Gout -Left shoulder X-Ray showed No acute fracture or subluxation. Osteoarthritic changes. -Currently not in a Flare  Diarrhea  -Seems Resolved. -D/C'd Flagyl  -If continues will obtain C Diff testing and start on po Vanc  Hyperglycemia -BS on BMP was 140 -Will continue to Monitor CBG's and if increased will start SSI  ? [place] never came to see Patient -PT/OT Consulted  DVT  prophylaxis: Lovenox 30 mg sq daily Code Status: DNR Family Communication: Discussed with Nephew who is HCPOA at bedside Disposition Plan: SNF  Consultants:   None  Procedures: None  Antimicrobials: D/C'd IV Rocephin; Started Fluconazole (pharmacy to Dose) 02/23/2016  Subjective: Seen and examined at bedside and she was doing better and laughing and joking. Denied CP/SOB/N/V or Abdominal Pain. Her main complaint was her legs with the ulcers. PT saw the patient and recommended SNF. Patient agreeable to SNF now.   Objective: Vitals:   02/23/16 1325 02/23/16 2300 02/24/16 0100 02/24/16 0708  BP: (!) 146/77 134/76  (!) 142/79  Pulse:  74  72  Resp: 20 18    Temp: 98.2 F (36.8 C) 99.1 F (37.3 C) 98.9 F (37.2 C) 98.2 F (36.8 C)  TempSrc: Oral Oral Oral Oral  SpO2: 100% 99%  99%  Weight:      Height:        Intake/Output Summary (Last 24 hours) at 02/24/16 1316 Last data filed at 02/24/16 0518  Gross per 24 hour  Intake           1812.5 ml  Output                0 ml  Net           1812.5 ml   Filed Weights   02/21/16 1119 02/21/16 1120 02/21/16 1827  Weight: 93 kg (205 lb) 93 kg (205 lb) 86.1 kg (189 lb 12.8 oz)    Examination:  Physical Exam:  Constitutional: WN/WD, NAD and appears calm and comfortable Eyes: Lids and conjunctivae normal, sclerae anicteric  ENMT: External Ears, Nose appear normal. Grossly normal hearing.  Neck: Appears normal, supple, no cervical masses, normal ROM, no appreciable thyromegaly Respiratory: Clear to auscultation bilaterally, no wheezing, rales, rhonchi or crackles. Normal respiratory effort and patient is not tachypenic. No accessory muscle use.  Cardiovascular: RRR, no murmurs / rubs / gallops. S1 and S2 auscultated. No extremity edema. Abdomen: Soft, non-tender, non-distended. No masses palpated. No appreciable hepatosplenomegaly. Bowel sounds positive x4.  GU: Deferred. Musculoskeletal: No clubbing / cyanosis of digits/nails. No  joint deformity upper and lower extremities.  Skin: B/L LE with Ulcers that are wrapped and in boots. Unable to turn to view Sacral Decubitus Ulcer again.  Neurologic: CN 2-12 grossly intact with no focal deficits. Sensation intact in all 4 Extremities. Romberg sign cerebellar reflexes not assessed.  Psychiatric: Normal judgment and insight. Awake and alert and oriented x3. Normal mood and appropriate affect.   Data Reviewed: I have personally reviewed following labs and imaging studies  CBC:  Recent Labs Lab 02/21/16 1219 02/22/16 0349 02/23/16 0502 02/24/16 0850  WBC 10.7* 9.1 11.0* 10.2  NEUTROABS 8.4*  --  8.1* 7.5  HGB 11.9* 11.0* 10.5* 10.6*  HCT 34.4* 33.2* 32.0* 32.7*  MCV 85.6 87.4 87.7 89.1  PLT 285 260 275 0000000   Basic Metabolic Panel:  Recent Labs Lab 02/21/16 1219 02/22/16 0349 02/23/16 0502 02/24/16 0850  NA 137 138 139 135  K 4.0 3.5 3.4* 4.2  CL 105 108 108 110  CO2 24 22 22  19*  GLUCOSE 148* 209* 182* 140*  BUN 33* 31* 29* 27*  CREATININE 1.60* 1.42* 1.40* 1.51*  CALCIUM 9.0 8.6* 8.1* 7.9*  MG  --   --  1.8 1.7  PHOS  --   --  3.4 2.9   GFR: Estimated Creatinine Clearance: 21.6 mL/min (by C-G formula based on SCr of 1.51 mg/dL (H)). Liver Function Tests:  Recent Labs Lab 02/21/16 1219 02/22/16 0349 02/23/16 0502 02/24/16 0850  AST 34 28 27 24   ALT 12* 9* 9* 9*  ALKPHOS 105 93 99 81  BILITOT 0.9 0.7 0.6 0.5  PROT 7.7 6.4* 6.2* 6.2*  ALBUMIN 3.0* 2.6* 2.4* 2.3*    Recent Labs Lab 02/21/16 1219  LIPASE 20   No results for input(s): AMMONIA in the last 168 hours. Coagulation Profile: No results for input(s): INR, PROTIME in the last 168 hours. Cardiac Enzymes: No results for input(s): CKTOTAL, CKMB, CKMBINDEX, TROPONINI in the last 168 hours. BNP (last 3 results) No results for input(s): PROBNP in the last 8760 hours. HbA1C: No results for input(s): HGBA1C in the last 72 hours. CBG: No results for input(s): GLUCAP in the last 168  hours. Lipid Profile: No results for input(s): CHOL, HDL, LDLCALC, TRIG, CHOLHDL, LDLDIRECT in the last 72 hours. Thyroid Function Tests: No results for input(s): TSH, T4TOTAL, FREET4, T3FREE, THYROIDAB in the last 72 hours. Anemia Panel: No results for input(s): VITAMINB12, FOLATE, FERRITIN, TIBC, IRON, RETICCTPCT in the last 72 hours. Sepsis Labs:  Recent Labs Lab 02/21/16 1230  LATICACIDVEN 1.29    Recent Results (from the past 240 hour(s))  Blood culture (routine x 2)     Status: None (Preliminary result)   Collection Time: 02/21/16 12:00 PM  Result Value Ref Range Status   Specimen Description BLOOD RIGHT ANTECUBITAL  Final   Special Requests IN PEDIATRIC BOTTLE [facility]  Final   Culture  Final    NO GROWTH 2 DAYS Performed at [facility]    Report Status PENDING  Incomplete  Blood culture (routine x 2)     Status: None (Preliminary result)   Collection Time: 02/21/16 12:15 PM  Result Value Ref Range Status   Specimen Description BLOOD LEFT ANTECUBITAL  Final   Special Requests BOTTLES DRAWN AEROBIC AND ANAEROBIC 5CC  Final   Culture   Final    NO GROWTH 2 DAYS Performed at [facility]    Report Status PENDING  Incomplete  Urine culture     Status: Abnormal   Collection Time: 02/21/16 12:34 PM  Result Value Ref Range Status   Specimen Description URINE, CATHETERIZED  Final   Special Requests NONE  Final   Culture 60,000 COLONIES/mL YEAST (A)  Final   Report Status 02/22/2016 FINAL  Final    Radiology Studies: No results found. Scheduled Meds: . amLODipine  10 mg Oral Daily  . aspirin EC  81 mg Oral Daily  . atorvastatin  40 mg Oral q1800  . enoxaparin (LOVENOX) injection  30 mg Subcutaneous Q24H  . fluconazole  100 mg Oral Q24H  . metoprolol succinate  100 mg Oral Daily  . sodium chloride flush  3 mL Intravenous Q12H   Continuous Infusions: . sodium chloride 75 mL/hr at 02/23/16 0824    LOS: 3 days   Kerney Elbe, DO [facility] Pager [phone]  If 7PM-7AM, please contact night-coverage [email or web address] Password [email or web address] 02/24/2016, 1:16 PM

## 2016-02-24 NOTE — Evaluation (Signed)
Occupational Therapy Evaluation Patient Details Name: Jill Allen MRN: [id number] DOB: 02-01-1916 Today's Date: 02/24/2016    History of Present Illness 82 yo female admitted with sepsis. Hx of HTN, venous stasis with ulcers LEs, DM, CKD, PAD   Clinical Impression   Pt was admitted for the above.  At baseline, she is mod I with adls from w/c level with intermittent assistance from landlord and family.  Pt was in a lot of pain at time of evaluation despite premedication.  She will benefit from continued OT to increase safety and independence with adls.  Pt needs max to total +2 assistance with adls at this time.  Goals are for overall min to mod A (+2 transfers) in acute, when pain is more controlled.      Follow Up Recommendations  SNF    Equipment Recommendations  None recommended by OT    Recommendations for Other Services       Precautions / Restrictions Precautions Precautions: Fall Restrictions Weight Bearing Restrictions: No      Mobility Bed Mobility Overal bed mobility: Needs Assistance;+2 for physical assistance;+ 2 for safety/equipment Bed Mobility: Rolling Rolling: Total assist;+2 for physical assistance;+2 for safety/equipment         General bed mobility comments: Pt only able to tolerate ~1/4 turn roll towards L side. Utilized bedpad to assist.   Transfers                 General transfer comment: NT unable to tolerate.  Pt usually performs lateral transfers to w/c    Balance                                            ADL       Grooming: Wash/dry hands;Moderate assistance;Bed level   Upper Body Bathing: Maximal assistance;Sitting   Lower Body Bathing: Total assistance;+2 for physical assistance;Bed level   Upper Body Dressing : Maximal assistance;Bed level   Lower Body Dressing: Total assistance;+2 for physical assistance;Bed level                 General ADL Comments: NT reports that pt eats very little  but fed herself with a lot of spillage.  Provided foam handles to built up utensils.  Pt with a lot of pain and edema in fingers--decreased ability to perform adls     Vision     Perception     Praxis      Pertinent Vitals/Pain Pain Assessment: Faces Faces Pain Scale: Hurts worst Pain Location: joints Pain Descriptors / Indicators: Sore Pain Intervention(s): Limited activity within patient's tolerance;Monitored during session;Premedicated before session;Repositioned     Hand Dominance     Extremity/Trunk Assessment Upper Extremity Assessment Upper Extremity Assessment: Defer to OT evaluation RUE Deficits / Details: Decreased shoulder ROM to approx 30* flexion; Strength NT due to pain Decreased hand function.  Edema present; limited finger flexion LUE Deficits / Details: Decreased shoulder ROM to approx 20* flexion; Strength NT due to pain;  Decreased hand function. Edema present, IV in this hand. Encouraged AROM of bil hands and repositioned on towel          Communication Communication Communication: No difficulties   Cognition Arousal/Alertness: Awake/alert Behavior During Therapy: WFL for tasks assessed/performed Overall Cognitive Status: Within Functional Limits for tasks assessed  General Comments       Exercises       Shoulder Instructions      Home Living Family/patient expects to be discharged to:: Unsure Living Arrangements: Other relatives Available Help at Discharge: Family;Friend(s);Available PRN/intermittently Type of Home: Apartment Home Access: Level entry     Home Layout: One level     Bathroom Shower/Tub: Other (comment) (sponge bathes)       How Accessible: Accessible via wheelchair Home Equipment: Wheelchair - manual   Additional Comments: has HHRN 3 x a week to wrap her legs.  Per chart pt has incontinence      Prior Functioning/Environment Level of Independence: Independent with assistive  device(s);Needs assistance  Gait / Transfers Assistance Needed: Patient is able to transfer into/out of w/c on her own at baseline.  Propels w/c with feet per patient. ADL's / Homemaking Assistance Needed: Patient takes sponge bath. Niece does housekeeping. [employer] and gets food "from [employer]            OT Problem List: Decreased strength;Decreased range of motion;Decreased activity tolerance;Decreased coordination;Decreased knowledge of use of DME or AE;Pain;Increased edema;Impaired UE functional use   OT Treatment/Interventions: Self-care/ADL training;Therapeutic exercise;DME and/or AE instruction;Energy conservation;Therapeutic activities;Patient/family education (edema management)    OT Goals(Current goals can be found in the care plan section) Acute Rehab OT Goals Patient Stated Goal: less pain OT Goal Formulation: With patient Time For Goal Achievement: 03/09/16 Potential to Achieve Goals: Fair ADL Goals Pt Will Perform Eating: with set-up;with adaptive utensils;bed level;sitting Pt Will Perform Grooming: with set-up;sitting;bed level;with adaptive equipment Pt Will Perform Lower Body Bathing: with adaptive equipment;with min assist;sitting/lateral leans;bed level Pt Will Transfer to Toilet: with mod assist;with +2 assist;with transfer board;bedside commode (drop arm) Pt Will Perform Toileting - Clothing Manipulation and hygiene: with min assist;sitting/lateral leans Additional ADL Goal #1: pt will perform bed mobility from hospital bed with min A in preparation for adls  OT Frequency: Min 2X/week   Barriers to D/C: Decreased caregiver support          Co-evaluation              End of Session    Activity Tolerance: Patient limited by pain Patient left: in bed;with call bell/phone within reach;with bed alarm set   Time: [id number] OT Time Calculation (min): 21 min Charges:  OT General Charges $OT Visit: 1 Procedure OT Evaluation $OT Eval  Moderate Complexity: 1 Procedure G-Codes:    Ta Fair 2016-03-04, 10:39 AM  Lesle Chris, OTR/L [phone] 03/04/2016

## 2016-02-25 LAB — COMPREHENSIVE METABOLIC PANEL
ALT: 10 U/L — AB (ref 14–54)
ANION GAP: 8 (ref 5–15)
AST: 26 U/L (ref 15–41)
Albumin: 2.2 g/dL — ABNORMAL LOW (ref 3.5–5.0)
Alkaline Phosphatase: 82 U/L (ref 38–126)
BUN: 23 mg/dL — ABNORMAL HIGH (ref 6–20)
CO2: 18 mmol/L — AB (ref 22–32)
CREATININE: 1.41 mg/dL — AB (ref 0.44–1.00)
Calcium: 8.1 mg/dL — ABNORMAL LOW (ref 8.9–10.3)
Chloride: 111 mmol/L (ref 101–111)
GFR calc non Af Amer: 30 mL/min — ABNORMAL LOW (ref >60)
GFR, EST AFRICAN AMERICAN: 35 mL/min — AB (ref >60)
Glucose, Bld: 130 mg/dL — ABNORMAL HIGH (ref 65–99)
Potassium: 4.3 mmol/L (ref 3.5–5.1)
SODIUM: 137 mmol/L (ref 135–145)
Total Bilirubin: 0.9 mg/dL (ref 0.3–1.2)
Total Protein: 6.1 g/dL — ABNORMAL LOW (ref 6.5–8.1)

## 2016-02-25 LAB — CBC WITH DIFFERENTIAL/PLATELET
BASOS PCT: 0 %
Basophils Absolute: 0 10*3/uL (ref 0.0–0.1)
EOS PCT: 1 %
Eosinophils Absolute: 0.1 10*3/uL (ref 0.0–0.7)
HCT: 30.6 % — ABNORMAL LOW (ref 36.0–46.0)
Hemoglobin: 10.1 g/dL — ABNORMAL LOW (ref 12.0–15.0)
LYMPHS ABS: 1 10*3/uL (ref 0.7–4.0)
Lymphocytes Relative: 13 %
MCH: 28.7 pg (ref 26.0–34.0)
MCHC: 33 g/dL (ref 30.0–36.0)
MCV: 86.9 fL (ref 78.0–100.0)
Monocytes Absolute: 1.2 10*3/uL — ABNORMAL HIGH (ref 0.1–1.0)
Monocytes Relative: 15 %
Neutro Abs: 5.5 10*3/uL (ref 1.7–7.7)
Neutrophils Relative %: 71 %
PLATELETS: 309 10*3/uL (ref 150–400)
RBC: 3.52 MIL/uL — AB (ref 3.87–5.11)
RDW: 15.7 % — ABNORMAL HIGH (ref 11.5–15.5)
WBC: 7.8 10*3/uL (ref 4.0–10.5)

## 2016-02-25 LAB — PHOSPHORUS: PHOSPHORUS: 3 mg/dL (ref 2.5–4.6)

## 2016-02-25 LAB — MAGNESIUM: Magnesium: 1.7 mg/dL (ref 1.7–2.4)

## 2016-02-25 NOTE — Care Management Note (Signed)
Case Management Note  Patient Details  Name: Jill Allen MRN: [id number] Date of Birth: 04-23-1915  Subjective/Objective:                    Action/Plan:d/c SNF   Expected Discharge Date:   (unknown)               Expected Discharge Plan:  [place]  In-House Referral:  Clinical Social Work  Discharge planning Services     Post Acute Care Choice:  Home Health, Durable Medical Equipment ([facility] home-[facility], has w/c) Choice offered to:     DME Arranged:    DME Agency:     HH Arranged:    [place] Agency:     Status of Service:  Completed, signed off  If discussed at [employer], dates discussed:    Additional Comments:  Dessa Phi, RN 02/25/2016, 11:36 AM

## 2016-02-25 NOTE — Progress Notes (Signed)
Occupational Therapy Treatment Patient Details Name: Jill Allen MRN: [id number] DOB: [birth year] Today's Date: 02/25/2016    History of present illness 82 yo female admitted with sepsis. Hx of HTN, venous stasis with ulcers LEs, DM, CKD, PAD      Follow Up Recommendations  SNF    Equipment Recommendations  None recommended by OT    Recommendations for Other Services      Precautions / Restrictions Precautions Precautions: Fall Restrictions Weight Bearing Restrictions: No       Mobility Bed Mobility Overal bed mobility: Needs Assistance             General bed mobility comments: max A to reposition in bed as pt was leaning to the right encouraged pt to activate trunk to get to middle of the bed rather than leaning over.    Transfers                 General transfer comment: Na        ADL Overall ADL's : Needs assistance/impaired                                       General ADL Comments: Pt in bed leaning way over to Right. Pt mAX a to reposition.  OT placed pillows to pts sides for lateral supports. Encouraged pt to move BUE in bed. Pt performed limited ROm of fingers then refused further activity .  Explained importance of moving BUe in bed to prevent further swelling and soreness.                 Cognition   Behavior During Therapy: WFL for tasks assessed/performed Overall Cognitive Status: Within Functional Limits for tasks assessed                       Extremity/Trunk Assessment        Hands and wrist with edema- explained importance of elevating and ROM .  Pillows placed to elevate BUE              General Comments      Pertinent Vitals/ Pain       Faces Pain Scale: Hurts whole lot Pain Location: BUe joints Pain Descriptors / Indicators: Sore Pain Intervention(s): Monitored during session;Repositioned;Limited activity within patient's tolerance      Progress Toward Goals  OT Goals(current goals  can now be found in the care plan section)  Progress towards OT goals: Not progressing toward goals - comment     Plan Discharge plan remains appropriate       End of Session     Activity Tolerance Other (comment) (pt declined further activity )   Patient Left in bed;with call bell/phone within reach;with bed alarm set;with family/visitor present   Nurse Communication          Time: [id number] OT Time Calculation (min): 10 min  Charges: OT General Charges $OT Visit: 1 Procedure OT Treatments $Self Care/Home Management : 8-22 mins  Toshiko Kemler, Thereasa Parkin 02/25/2016, 4:35 PM

## 2016-02-25 NOTE — NC FL2 (Signed)
[place] LEVEL OF CARE SCREENING TOOL     IDENTIFICATION  Patient Name: Jill Allen Birthdate: February 24, 1916 Sex: female Admission Date (Current Location): 02/21/2016  [facility] and [place] Number:  Herbalist and Address:  [facility],  [place] [place], [place]      Provider Number: [id number]  Attending Physician Name and Address:  Kerney Elbe, DO  Relative Name and Phone Number:       Current Level of Care: Hospital Recommended Level of Care: [place] Prior Approval Number:    Date Approved/Denied:   PASRR Number: [id number]  Discharge Plan: SNF    Current Diagnoses: Patient Active Problem List   Diagnosis Date Noted  . Pressure injury of skin 02/22/2016  . Sepsis ([place]) 02/21/2016  . UTI (urinary tract infection) 02/06/2016  . Dehydration 02/06/2016  . Acute renal failure (ARF) ([place]) 02/06/2016  . CKD (chronic kidney disease), stage III 02/06/2016  . DM (diabetes mellitus), type 2 with renal complications ([place]) 0000000  . Venous stasis 02/06/2016  . PAOD (peripheral arterial occlusive disease) ([place]) 11/03/2014  . Chronic venous insufficiency 08/18/2014  . Venous stasis ulcer of right lower extremity ([place]) 08/18/2014  . Venous stasis ulcer of left lower extremity ([place]) 08/18/2014    Orientation RESPIRATION BLADDER Height & Weight     Self, Time, Situation, Place  Normal Continent Weight: 189 lb 12.8 oz (86.1 kg) Height:  5\' 3"  (160 cm)  BEHAVIORAL SYMPTOMS/MOOD NEUROLOGICAL BOWEL NUTRITION STATUS      Continent    AMBULATORY STATUS COMMUNICATION OF NEEDS Skin   Extensive Assist Verbally PU Stage and Appropriate Care, Other (Comment) (venous stasis ulcers)                       Personal Care Assistance Level of Assistance  Bathing, Feeding, Dressing Bathing Assistance: Limited assistance Feeding assistance: Limited assistance Dressing Assistance: Limited assistance      Functional Limitations Info             SPECIAL CARE FACTORS FREQUENCY                       Contractures Contractures Info: Not present    Additional Factors Info  Code Status Code Status Info: DNR             Current Medications (02/25/2016):  This is the current hospital active medication list Current Facility-Administered Medications  Medication Dose Route Frequency Provider Last Rate Last Dose  . 0.9 %  sodium chloride infusion   Intravenous Continuous Nita Sells, MD 75 mL/hr at 02/23/16 0824    . amLODipine (NORVASC) tablet 10 mg  10 mg Oral Daily Nita Sells, MD   10 mg at 02/25/16 0915  . aspirin EC tablet 81 mg  81 mg Oral Daily [facility], DO   81 mg at 02/25/16 [id number]  . atorvastatin (LIPITOR) tablet 40 mg  40 mg Oral q1800 [facility], DO   40 mg at 02/24/16 1749  . enoxaparin (LOVENOX) injection 30 mg  30 mg Subcutaneous Q24H Nita Sells, MD   30 mg at 02/25/16 0019  . fluconazole (DIFLUCAN) tablet 100 mg  100 mg Oral Q24H Emiliano Dyer, RPH   100 mg at 02/24/16 1749  . metoprolol succinate (TOPROL-XL) 24 hr tablet 100 mg  100 mg Oral Daily Nita Sells, MD   100 mg at 02/25/16 0917  . sodium chloride flush (NS)  0.9 % injection 3 mL  3 mL Intravenous Q12H Nita Sells, MD   3 mL at 02/25/16 0919  . traMADol (ULTRAM) tablet 50 mg  50 mg Oral Q6H PRN Bertram Savin Sheikh, DO   50 mg at 02/25/16 [id number]     Discharge Medications: Please see discharge summary for a list of discharge medications.  Relevant Imaging Results:  Relevant Lab Results:   Additional Information SS# [id number]  Amador Cunas, [place]

## 2016-02-25 NOTE — Progress Notes (Signed)
PROGRESS NOTE    Jill Allen  [id number] DOB: 1915/12/24 DOA: 02/21/2016 PCP: [name], MD   Brief Narrative: Patient is a 82 year old AAF with a PMH of Know Venous Stasis Ulcers followed by Dr. Bridgett Larsson, HTN, Gout and other comorbidities who was just recently admitted for AKI, Weakness, and Yeast UTI. It was recommended for her to go to SNF but elected to go home instead. She comes in more confused this time and reported feeling nauseous. In the ER it was reported that she was covered in stool and had diarrhea but has not had any more episodes. UA showed possible UTI. She was admitted for possible Pyelo vs. Diarrheal Disease but Upon seeing the patient today, diarrhea has ceased and it appears she may have a UTI. PT/OT Evaluated and Recommended SNF. Social Worker contacted for SNF placement.  Assessment & Plan:   Active Problems:   Sepsis ([place])   Pressure injury of skin  Generalized Weakness and Deconditioning -PT and OT Evaluated and Treated -Recommend SNF -Consulted Education officer, museum for SNF Placement  Yeast UTI poA -Urine Cx showed 60,00 CFU of Yeast -Fluconazole 200 mg IV x 1 dose  -C/w Fluconazole 100 mg po Daily because of CrCl <50  Lower Venous Extermity Ulcers -Wound Care Nurse consultation and Recc's appreciated. -Will need Air Mattress Overlay -Follows with Dr. Bridgett Larsson as an outpatient  Mild Leukocytosis -Improved. Patient's WBC went from 11.0 -> 10.2 -> 7.8  CKD Stage 3 -Patient's BUN/Cr went from 33/1/60 -> 31/1.42  -> 29/1.40 -> 27/1.51 -> 23/1.41 -Repeat BMP in AM  Gout -Left shoulder X-Ray showed No acute fracture or subluxation. Osteoarthritic changes. -Currently not in a Flare  Diarrhea  -No Reoccurrence. -D/C'd Flagyl  -If continues will obtain C Diff testing and start on po Vanc  Hyperglycemia -BS on BMP was 130 -Will continue to Monitor CBG's and if increased will start SSI  DVT prophylaxis: Lovenox 30 mg sq daily Code Status: DNR Family  Communication: Discussed with Nephew who is HCPOA at bedside Disposition Plan: SNF  Consultants:   None  Procedures: None  Antimicrobials: D/C'd IV Rocephin; Started Fluconazole (pharmacy to Dose) 02/23/2016  Subjective: Seen and examined at bedside and she was confused this morning saying that she had been "brought and dropped off here every morning." She was also mad that no one "came to get her yesterday." She was agitated and wanted the cops to be called because she didn't know what was going on. When it was explained to her the plan was to get her to a SNF she stated that she's "not signing anything." Denied any CP/SOB/N/V or Lightheadedness. No other complaints or concerns and seemed to calm down after a little bit.    Objective: Vitals:   02/24/16 2125 02/25/16 0518 02/25/16 0915 02/25/16 1359  BP: (!) 155/71 (!) 165/93 (!) 144/76 (!) 165/93  Pulse: 85 80 80 82  Resp: 16 17  18   Temp: 99 F (37.2 C) 99.1 F (37.3 C)  98.7 F (37.1 C)  TempSrc: Oral Oral  Oral  SpO2: 99% 100%  100%  Weight:      Height:        Intake/Output Summary (Last 24 hours) at 02/25/16 1535 Last data filed at 02/25/16 0915  Gross per 24 hour  Intake           1945.5 ml  Output                0 ml  Net  1945.5 ml   Filed Weights   02/21/16 1119 02/21/16 1120 02/21/16 1827  Weight: 93 kg (205 lb) 93 kg (205 lb) 86.1 kg (189 lb 12.8 oz)    Examination: Physical Exam:  Constitutional: WN/WD, NAD and appears calm and comfortable Eyes: Lids and conjunctivae normal, sclerae anicteric  ENMT: External Ears, Nose appear normal. Grossly normal hearing.  Neck: Appears normal, supple, no cervical masses, normal ROM, no appreciable thyromegaly Respiratory: Clear to auscultation bilaterally, no wheezing, rales, rhonchi or crackles. Normal respiratory effort and patient is not tachypenic. No accessory muscle use.  Cardiovascular: RRR, no murmurs / rubs / gallops. S1 and S2 auscultated. No  extremity edema. Abdomen: Soft, non-tender, non-distended. No masses palpated. No appreciable hepatosplenomegaly. Bowel sounds positive x4.  GU: Deferred. Musculoskeletal: No clubbing / cyanosis of digits/nails. No joint deformity upper and lower extremities.  Skin: B/L LE with Ulcers that are wrapped and in boots. Unable to turn to view Sacral Decubitus Ulcer again because of patient positioning.  Neurologic: CN 2-12 grossly intact with no focal deficits. Sensation intact in all 4 Extremities. Romberg sign cerebellar reflexes not assessed.  Psychiatric: Normal judgment and insight. Awake but not oriented x3. Agitated mood and appropriate affect.   Data Reviewed: I have personally reviewed following labs and imaging studies  CBC:  Recent Labs Lab 02/21/16 1219 02/22/16 0349 02/23/16 0502 02/24/16 0850 02/25/16 0521  WBC 10.7* 9.1 11.0* 10.2 7.8  NEUTROABS 8.4*  --  8.1* 7.5 5.5  HGB 11.9* 11.0* 10.5* 10.6* 10.1*  HCT 34.4* 33.2* 32.0* 32.7* 30.6*  MCV 85.6 87.4 87.7 89.1 86.9  PLT 285 260 275 296 [id number]   Basic Metabolic Panel:  Recent Labs Lab 02/21/16 1219 02/22/16 0349 02/23/16 0502 02/24/16 0850 02/25/16 0521  NA 137 138 139 135 137  K 4.0 3.5 3.4* 4.2 4.3  CL 105 108 108 110 111  CO2 24 22 22  19* 18*  GLUCOSE 148* 209* 182* 140* 130*  BUN 33* 31* 29* 27* 23*  CREATININE 1.60* 1.42* 1.40* 1.51* 1.41*  CALCIUM 9.0 8.6* 8.1* 7.9* 8.1*  MG  --   --  1.8 1.7 1.7  PHOS  --   --  3.4 2.9 3.0   GFR: Estimated Creatinine Clearance: 23.2 mL/min (by C-G formula based on SCr of 1.41 mg/dL (H)). Liver Function Tests:  Recent Labs Lab 02/21/16 1219 02/22/16 0349 02/23/16 0502 02/24/16 0850 02/25/16 0521  AST 34 28 27 24 26   ALT 12* 9* 9* 9* 10*  ALKPHOS 105 93 99 81 82  BILITOT 0.9 0.7 0.6 0.5 0.9  PROT 7.7 6.4* 6.2* 6.2* 6.1*  ALBUMIN 3.0* 2.6* 2.4* 2.3* 2.2*    Recent Labs Lab 02/21/16 1219  LIPASE 20   No results for input(s): AMMONIA in the last 168  hours. Coagulation Profile: No results for input(s): INR, PROTIME in the last 168 hours. Cardiac Enzymes: No results for input(s): CKTOTAL, CKMB, CKMBINDEX, TROPONINI in the last 168 hours. BNP (last 3 results) No results for input(s): PROBNP in the last 8760 hours. HbA1C: No results for input(s): HGBA1C in the last 72 hours. CBG: No results for input(s): GLUCAP in the last 168 hours. Lipid Profile: No results for input(s): CHOL, HDL, LDLCALC, TRIG, CHOLHDL, LDLDIRECT in the last 72 hours. Thyroid Function Tests: No results for input(s): TSH, T4TOTAL, FREET4, T3FREE, THYROIDAB in the last 72 hours. Anemia Panel: No results for input(s): VITAMINB12, FOLATE, FERRITIN, TIBC, IRON, RETICCTPCT in the last 72 hours. Sepsis Labs:  Recent Labs  Lab 02/21/16 1230  LATICACIDVEN 1.29    Recent Results (from the past 240 hour(s))  Blood culture (routine x 2)     Status: None (Preliminary result)   Collection Time: 02/21/16 12:00 PM  Result Value Ref Range Status   Specimen Description BLOOD RIGHT ANTECUBITAL  Final   Special Requests IN PEDIATRIC BOTTLE 3CC  Final   Culture   Final    NO GROWTH 4 DAYS Performed at [facility]    Report Status PENDING  Incomplete  Blood culture (routine x 2)     Status: None (Preliminary result)   Collection Time: 02/21/16 12:15 PM  Result Value Ref Range Status   Specimen Description BLOOD LEFT ANTECUBITAL  Final   Special Requests BOTTLES DRAWN AEROBIC AND ANAEROBIC 5CC  Final   Culture   Final    NO GROWTH 4 DAYS Performed at [facility]    Report Status PENDING  Incomplete  Urine culture     Status: Abnormal   Collection Time: 02/21/16 12:34 PM  Result Value Ref Range Status   Specimen Description URINE, CATHETERIZED  Final   Special Requests NONE  Final   Culture 60,000 COLONIES/mL YEAST (A)  Final   Report Status 02/22/2016 FINAL  Final    Radiology Studies: No results found. Scheduled Meds: . amLODipine  10 mg Oral  Daily  . aspirin EC  81 mg Oral Daily  . atorvastatin  40 mg Oral q1800  . enoxaparin (LOVENOX) injection  30 mg Subcutaneous Q24H  . fluconazole  100 mg Oral Q24H  . metoprolol succinate  100 mg Oral Daily  . sodium chloride flush  3 mL Intravenous Q12H   Continuous Infusions: . sodium chloride 75 mL/hr at 02/25/16 1159    LOS: 4 days   Kerney Elbe, DO [facility] Pager [phone]  If 7PM-7AM, please contact night-coverage [email or web address] Password [email or web address] 02/25/2016, 3:35 PM

## 2016-02-25 NOTE — Progress Notes (Signed)
PT Cancellation Note  Patient Details Name: Jill Allen MRN: [id number] DOB: 02-Feb-1916   Cancelled Treatment:    Reason Eval/Treat Not Completed: Patient declined, no reason specified Pt encouraged to sit EOB or even exercise LEs however she declines and states, "you know I'm 89".   Makaylie Dedeaux,KATHrine E 02/25/2016, 3:30 PM Carmelia Bake, PT, DPT 02/25/2016 Pager: [phone]

## 2016-02-25 NOTE — Consult Note (Signed)
CSW consult for SNF. Spoke with pt's nephew/HCPOA Elenore Rota [phone] who reports d/c plan has changed from home with resumption of HH to SNF. CSW explained placement process and answered questions. Pt's HCPOA aware plan is for d/c today. Will initiate SNF search and f/u with offers once available.   Wandra Feinstein, MSW, LCSW [phone] (coverage)

## 2016-02-25 NOTE — Progress Notes (Signed)
CSW met with pt and pt's nephew/HCPOA at bedside and provided current SNF offers. Pt's nephew reports he will tour facilities and notify CSW of choice.   Jill Allen, MSW, LCSW

## 2016-02-26 DIAGNOSIS — N390 Urinary tract infection, site not specified: Secondary | ICD-10-CM | POA: Diagnosis not present

## 2016-02-26 DIAGNOSIS — I872 Venous insufficiency (chronic) (peripheral): Secondary | ICD-10-CM

## 2016-02-26 DIAGNOSIS — M6281 Muscle weakness (generalized): Secondary | ICD-10-CM | POA: Diagnosis not present

## 2016-02-26 DIAGNOSIS — I7389 Other specified peripheral vascular diseases: Secondary | ICD-10-CM | POA: Diagnosis not present

## 2016-02-26 DIAGNOSIS — R739 Hyperglycemia, unspecified: Secondary | ICD-10-CM | POA: Diagnosis not present

## 2016-02-26 DIAGNOSIS — N183 Chronic kidney disease, stage 3 (moderate): Secondary | ICD-10-CM | POA: Diagnosis not present

## 2016-02-26 DIAGNOSIS — L97919 Non-pressure chronic ulcer of unspecified part of right lower leg with unspecified severity: Secondary | ICD-10-CM

## 2016-02-26 DIAGNOSIS — E785 Hyperlipidemia, unspecified: Secondary | ICD-10-CM | POA: Diagnosis not present

## 2016-02-26 DIAGNOSIS — L97929 Non-pressure chronic ulcer of unspecified part of left lower leg with unspecified severity: Secondary | ICD-10-CM

## 2016-02-26 DIAGNOSIS — R4182 Altered mental status, unspecified: Secondary | ICD-10-CM | POA: Diagnosis not present

## 2016-02-26 DIAGNOSIS — D6489 Other specified anemias: Secondary | ICD-10-CM | POA: Diagnosis not present

## 2016-02-26 DIAGNOSIS — R6520 Severe sepsis without septic shock: Secondary | ICD-10-CM | POA: Diagnosis not present

## 2016-02-26 DIAGNOSIS — E43 Unspecified severe protein-calorie malnutrition: Secondary | ICD-10-CM | POA: Diagnosis not present

## 2016-02-26 DIAGNOSIS — I10 Essential (primary) hypertension: Secondary | ICD-10-CM | POA: Diagnosis not present

## 2016-02-26 DIAGNOSIS — B3749 Other urogenital candidiasis: Secondary | ICD-10-CM | POA: Diagnosis not present

## 2016-02-26 DIAGNOSIS — R41841 Cognitive communication deficit: Secondary | ICD-10-CM | POA: Diagnosis not present

## 2016-02-26 DIAGNOSIS — E1129 Type 2 diabetes mellitus with other diabetic kidney complication: Secondary | ICD-10-CM | POA: Diagnosis not present

## 2016-02-26 DIAGNOSIS — R531 Weakness: Secondary | ICD-10-CM | POA: Diagnosis not present

## 2016-02-26 DIAGNOSIS — E784 Other hyperlipidemia: Secondary | ICD-10-CM | POA: Diagnosis not present

## 2016-02-26 DIAGNOSIS — A419 Sepsis, unspecified organism: Secondary | ICD-10-CM | POA: Diagnosis not present

## 2016-02-26 DIAGNOSIS — E1159 Type 2 diabetes mellitus with other circulatory complications: Secondary | ICD-10-CM | POA: Diagnosis not present

## 2016-02-26 DIAGNOSIS — I878 Other specified disorders of veins: Secondary | ICD-10-CM | POA: Diagnosis not present

## 2016-02-26 LAB — CULTURE, BLOOD (ROUTINE X 2)
Culture: NO GROWTH
Culture: NO GROWTH

## 2016-02-26 MED ORDER — FLUCONAZOLE 100 MG PO TABS: 100.0000 mg | ORAL_TABLET | ORAL | 0 refills | Status: DC

## 2016-02-26 MED ORDER — MAGNESIUM OXIDE 400 (241.3 MG) MG PO TABS
400.0000 mg | ORAL_TABLET | Freq: Two times a day (BID) | ORAL | Status: DC
  Administered 2016-02-26: 400 mg via ORAL
  Filled 2016-02-26: qty 1

## 2016-02-26 MED ORDER — MAGNESIUM OXIDE 400 (241.3 MG) MG PO TABS: 400.0000 mg | ORAL_TABLET | Freq: Two times a day (BID) | ORAL | 0 refills | Status: DC

## 2016-02-26 NOTE — Care Management Important Message (Signed)
Important Message  Patient Details  Name: Jill Allen MRN: [id number] Date of Birth: [birth year]   Medicare Important Message Given:  Yes    Camillo Flaming 02/26/2016, 9:23 AMImportant Message  Patient Details  Name: Jill Allen MRN: [id number] Date of Birth: 12/11/1915   Medicare Important Message Given:  Yes    Camillo Flaming 02/26/2016, 9:23 AM

## 2016-02-26 NOTE — Progress Notes (Signed)
Pharmacy Antibiotic Note  Jill Allen is a 82 y.o. female admitted on 02/21/2016 with UTI, urine cultures growing 60k colonies yeast. Pharmacy has been consulted for fluconazole dosing.  Today, 02/26/2016: - afeb, wbc wnl - CKD3, scr down to 1.41 on 11/13 (crcl~23)  Plan:  Continue Fluconazole 100mg  PO daily for CrCl < 50 ml/min  D/t age, crcl will be <50 even if scr is wnl.  Current dose is appropriate for indication/renal funct and unlikely will need to be adjusted. Pharmacy will sign off. Re-consult [place] if need further assistance.  _____________________  Height: 5\' 3"  (160 cm) Weight: 189 lb 12.8 oz (86.1 kg) IBW/kg (Calculated) : 52.4  Temp (24hrs), Avg:98.4 F (36.9 C), Min:98.2 F (36.8 C), Max:98.7 F (37.1 C)   Recent Labs Lab 02/21/16 1219 02/21/16 1230 02/22/16 0349 02/23/16 0502 02/24/16 0850 02/25/16 0521  WBC 10.7*  --  9.1 11.0* 10.2 7.8  CREATININE 1.60*  --  1.42* 1.40* 1.51* 1.41*  LATICACIDVEN  --  1.29  --   --   --   --     Estimated Creatinine Clearance: 23.2 mL/min (by C-G formula based on SCr of 1.41 mg/dL (H)).    No Known Allergies  Antimicrobials this admission:  11/9 CTX >> 11/11 11/9 flagyl IV >> 11/10 (does not suspect cdiff) 11/11 fluconazole >>  Dose adjustments this admission:  n/a  Microbiology results:  11/9 BCx x2: ngtd 11/9 UCx: 60k yeast FINAL   Thank you for allowing pharmacy to be a part of this patient's care.  Dia Sitter, PharmD, BCPS 02/26/2016 8:23 AM

## 2016-02-26 NOTE — Discharge Summary (Signed)
Physician Discharge Summary  Jill Allen [id number] DOB: [birth year] DOA: 02/21/2016  PCP: [name], MD  Admit date: 02/21/2016 Discharge date: 02/26/2016  Admitted From: Home Disposition: SNF  Recommendations for Outpatient Follow-up:  1. Follow up with PCP in 1-2 weeks 2. Please have Wound Care Follow at Facility 3. Please obtain BMP/CBC in one week  Home Health: No Equipment/Devices: None  Discharge Condition: Stable CODE STATUS: FULL Diet recommendation: Heart Healthy   Brief/Interim Summary: Patient is a 82 year old AAF with a PMH of Know Venous Stasis Ulcers followed by Dr. Bridgett Larsson, HTN, Gout and other comorbidities who was just recently admitted for AKI, Weakness, and Yeast UTI. It was recommended for her to go to SNF but elected to go home instead. She comes in more confused this time and reported feeling nauseous. In the ER it was reported that she was covered in stool and had diarrhea but has not had any more episodes. UA showed possible UTI and was found to have a recurrent Yeast UTI. She was admitted for possible Pyelo vs. Diarrheal Disease but Upon seeing the patient today, diarrhea has ceased and it appears her main problem was Yeast UTI and Generalized Weakness. PT/OT Evaluated and Recommended SNF. Social Worker contacted for SNF placement. Patient was doing well this AM and had no active complaints and will be D/C'd to SNF today as she is deemed medically stable at this time.   Discharge Diagnoses:  Active Problems:   Sepsis ([place])   Pressure injury of skin  Generalized Weakness and Deconditioning -PT and OT Evaluated and Treated -Recommend SNF -Continue PT and OT at SNF  Yeast UTI poA -Urine Cx showed 60,00 CFU of Yeast -Fluconazole 200 mg IV x 1 dose  -C/w Fluconazole 100 mg po Daily because of CrCl <50 for 12 more Doses  Lower Venous Extermity Ulcers -Wound Care Nurse consultation and Recc's appreciated. -Will need Air Mattress Overlay -Follows  with Dr. Bridgett Larsson as an outpatient -Follow up with Wound Care Nurse at SNF  Mild Leukocytosis -Improved.  -Patient's WBC went from 11.0 -> 10.2 -> 7.8 -Repeat BMP as an outpatient within 1 week.   CKD Stage 3 -Stable. -Patient's BUN/Cr went from 33/1/60 -> 31/1.42  -> 29/1.40 -> 27/1.51 -> 23/1.41 -Repeat BMP as an outpatient within 1 week.   Gout -Left shoulder X-Ray showed No acute fracture or subluxation. Osteoarthritic changes. -Currently not in a Flare  Diarrhea  -No Reoccurrence. -D/C'd Flagyl  -If continues will obtain C Diff testing and start on po Vanc  Hyperglycemia -BS on BMP was 130 yesterday -Monitor BS as an outpatient at SNF.   Discharge Instructions  Discharge Instructions    Call MD for:  difficulty breathing, headache or visual disturbances    Complete by:  As directed    Call MD for:  persistant dizziness or light-headedness    Complete by:  As directed    Call MD for:  persistant nausea and vomiting    Complete by:  As directed    Call MD for:  severe uncontrolled pain    Complete by:  As directed    Diet - low sodium heart healthy    Complete by:  As directed    Discharge instructions    Complete by:  As directed    Follow Up Care at SNF. Take all medications as prescribed. Have Wound Care follow to manage legs.   Increase activity slowly    Complete by:  As directed  Medication List    TAKE these medications   acetaminophen 500 MG tablet Commonly known as:  TYLENOL Take 1,000 mg by mouth every 8 (eight) hours as needed for moderate pain.   amLODipine 10 MG tablet Commonly known as:  NORVASC Take 10 mg by mouth daily.   aspirin EC 81 MG tablet Take 81 mg by mouth daily.   atorvastatin 40 MG tablet Commonly known as:  LIPITOR Take 40 mg by mouth daily.   fluconazole 100 MG tablet Commonly known as:  DIFLUCAN Take 1 tablet (100 mg total) by mouth daily.   magnesium oxide 400 (241.3 Mg) MG tablet Commonly known as:   MAG-OX Take 1 tablet (400 mg total) by mouth 2 (two) times daily.   metoprolol succinate 100 MG 24 hr tablet Commonly known as:  TOPROL-XL Take 100 mg by mouth daily. Take with or immediately following a meal.       No Known Allergies  Consultations:  None   Procedures/Studies: Dg Chest 2 View  Result Date: 02/21/2016 CLINICAL DATA:  Altered mental status EXAM: CHEST  2 VIEW COMPARISON:  02/06/2016 FINDINGS: The heart size and mediastinal contours are within normal limits. Both lungs are clear. The visualized skeletal structures are unremarkable. IMPRESSION: No active cardiopulmonary disease. Electronically Signed   By: Kerby Moors M.D.   On: 02/21/2016 13:05   US Renal  Result Date: 02/07/2016 CLINICAL DATA:  82 year old female with acute on chronic renal failure. History of diabetes and hypertension. EXAM: RENAL / URINARY TRACT ULTRASOUND COMPLETE COMPARISON:  None. FINDINGS: Right Kidney: Length: 9 cm. There is diffuse cortical thinning with mild increased echotexture. No hydronephrosis or echogenic stone. Left Kidney: Length: 9 cm. Diffuse cortical thinning with mild increased echogenicity. No hydronephrosis or echogenic stone. Bladder: Layering echogenic debris noted within the bladder. The urinary bladder is otherwise grossly unremarkable. Bilateral ureteral jets noted. IMPRESSION: Moderate bilateral renal atrophy and cortical thinning, likely related to chronic kidney disease. No hydronephrosis or echogenic stone. Layering echogenic debris within the urinary bladder. Correlation with urinalysis recommended. Bilateral ureteral jets identified. Electronically Signed   By: Anner Crete M.D.   On: 02/07/2016 06:57   Dg Chest Portable 1 View  Result Date: 02/06/2016 CLINICAL DATA:  Dizziness.  Cough. EXAM: PORTABLE CHEST 1 VIEW COMPARISON:  No recent prior . FINDINGS: Mediastinum hilar structures are normal. Low lung volumes with mild basilar atelectasis. Heart size normal.  Tiny left pleural effusion cannot be excluded. No pneumothorax. No acute bony abnormality. Degenerative changes both shoulders. IMPRESSION: Low lung volumes with mild basilar atelectasis. Tiny left pleural effusion cannot be excluded. Exam is otherwise unremarkable. Electronically Signed   By: Marcello Moores  Register   On: 02/06/2016 14:16   Dg Shoulder Left  Result Date: 02/21/2016 CLINICAL DATA:  Left shoulder pain EXAM: LEFT SHOULDER - 2+ VIEW COMPARISON:  None. FINDINGS: Two views of the left shoulder submitted. Significant degenerative changes with narrowing of joint space and mild gurney sclerosis noted glenohumeral joint. There is spurring of humeral head. Moderate degenerative changes AC joint. No acute fracture or subluxation IMPRESSION: No acute fracture or subluxation. Osteoarthritic changes as described above. Electronically Signed   By: Lahoma Crocker M.D.   On: 02/21/2016 16:30     Subjective: Seen and examined at bedside today and stated she was "ok." Wanted to know where her nephew was today. No CP/SOB/N/V or Diarrhea. No other concerns or complaints at this time.   Discharge Exam: Vitals:   02/26/16 [id number] 02/26/16 [id number]  BP: 140/80 (!) 141/78  Pulse: 78   Resp: 20   Temp: 98.3 F (36.8 C)    Vitals:   02/25/16 1359 02/25/16 1959 02/26/16 0610 02/26/16 0842  BP: (!) 165/93 (!) 149/74 140/80 (!) 141/78  Pulse: 82 76 78   Resp: 18 18 20    Temp: 98.7 F (37.1 C) 98.2 F (36.8 C) 98.3 F (36.8 C)   TempSrc: Oral Oral Oral   SpO2: 100% 98% 97%   Weight:      Height:       General: Pt is alert, awake, not in acute distress Cardiovascular: RRR, S1/S2 +, no rubs, no gallops Respiratory: CTA bilaterally, no wheezing, no rhonchi Abdominal: Soft, NT, ND, bowel sounds + Extremities: Legs wrapped because of ulcerations and in boots  The results of significant diagnostics from this hospitalization (including imaging, microbiology, ancillary and laboratory) are listed below for reference.      Microbiology: Recent Results (from the past 240 hour(s))  Blood culture (routine x 2)     Status: None (Preliminary result)   Collection Time: 02/21/16 12:00 PM  Result Value Ref Range Status   Specimen Description BLOOD RIGHT ANTECUBITAL  Final   Special Requests IN PEDIATRIC BOTTLE 3CC  Final   Culture   Final    NO GROWTH 4 DAYS Performed at [facility]    Report Status PENDING  Incomplete  Blood culture (routine x 2)     Status: None (Preliminary result)   Collection Time: 02/21/16 12:15 PM  Result Value Ref Range Status   Specimen Description BLOOD LEFT ANTECUBITAL  Final   Special Requests BOTTLES DRAWN AEROBIC AND ANAEROBIC 5CC  Final   Culture   Final    NO GROWTH 4 DAYS Performed at [facility]    Report Status PENDING  Incomplete  Urine culture     Status: Abnormal   Collection Time: 02/21/16 12:34 PM  Result Value Ref Range Status   Specimen Description URINE, CATHETERIZED  Final   Special Requests NONE  Final   Culture 60,000 COLONIES/mL YEAST (A)  Final   Report Status 02/22/2016 FINAL  Final    Labs: BNP (last 3 results) No results for input(s): BNP in the last 8760 hours. Basic Metabolic Panel:  Recent Labs Lab 02/21/16 1219 02/22/16 0349 02/23/16 0502 02/24/16 0850 02/25/16 0521  NA 137 138 139 135 137  K 4.0 3.5 3.4* 4.2 4.3  CL 105 108 108 110 111  CO2 24 22 22  19* 18*  GLUCOSE 148* 209* 182* 140* 130*  BUN 33* 31* 29* 27* 23*  CREATININE 1.60* 1.42* 1.40* 1.51* 1.41*  CALCIUM 9.0 8.6* 8.1* 7.9* 8.1*  MG  --   --  1.8 1.7 1.7  PHOS  --   --  3.4 2.9 3.0   Liver Function Tests:  Recent Labs Lab 02/21/16 1219 02/22/16 0349 02/23/16 0502 02/24/16 0850 02/25/16 0521  AST 34 28 27 24 26   ALT 12* 9* 9* 9* 10*  ALKPHOS 105 93 99 81 82  BILITOT 0.9 0.7 0.6 0.5 0.9  PROT 7.7 6.4* 6.2* 6.2* 6.1*  ALBUMIN 3.0* 2.6* 2.4* 2.3* 2.2*    Recent Labs Lab 02/21/16 1219  LIPASE 20   No results for input(s): AMMONIA in the  last 168 hours. CBC:  Recent Labs Lab 02/21/16 1219 02/22/16 0349 02/23/16 0502 02/24/16 0850 02/25/16 0521  WBC 10.7* 9.1 11.0* 10.2 7.8  NEUTROABS 8.4*  --  8.1* 7.5 5.5  HGB 11.9* 11.0*  10.5* 10.6* 10.1*  HCT 34.4* 33.2* 32.0* 32.7* 30.6*  MCV 85.6 87.4 87.7 89.1 86.9  PLT 285 260 275 296 309   Cardiac Enzymes: No results for input(s): CKTOTAL, CKMB, CKMBINDEX, TROPONINI in the last 168 hours. BNP: Invalid input(s): POCBNP CBG: No results for input(s): GLUCAP in the last 168 hours. D-Dimer No results for input(s): DDIMER in the last 72 hours. Hgb A1c No results for input(s): HGBA1C in the last 72 hours. Lipid Profile No results for input(s): CHOL, HDL, LDLCALC, TRIG, CHOLHDL, LDLDIRECT in the last 72 hours. Thyroid function studies No results for input(s): TSH, T4TOTAL, T3FREE, THYROIDAB in the last 72 hours.  Invalid input(s): FREET3 Anemia work up No results for input(s): VITAMINB12, FOLATE, FERRITIN, TIBC, IRON, RETICCTPCT in the last 72 hours. Urinalysis    Component Value Date/Time   COLORURINE YELLOW 02/21/2016 1234   APPEARANCEUR CLOUDY (A) 02/21/2016 1234   LABSPEC 1.015 02/21/2016 1234   PHURINE 6.0 02/21/2016 1234   GLUCOSEU NEGATIVE 02/21/2016 1234   HGBUR MODERATE (A) 02/21/2016 1234   BILIRUBINUR NEGATIVE 02/21/2016 1234   KETONESUR NEGATIVE 02/21/2016 1234   PROTEINUR 30 (A) 02/21/2016 1234   NITRITE NEGATIVE 02/21/2016 1234   LEUKOCYTESUR LARGE (A) 02/21/2016 1234   Sepsis Labs Invalid input(s): PROCALCITONIN,  WBC,  LACTICIDVEN Microbiology Recent Results (from the past 240 hour(s))  Blood culture (routine x 2)     Status: None (Preliminary result)   Collection Time: 02/21/16 12:00 PM  Result Value Ref Range Status   Specimen Description BLOOD RIGHT ANTECUBITAL  Final   Special Requests IN PEDIATRIC BOTTLE 3CC  Final   Culture   Final    NO GROWTH 4 DAYS Performed at [facility]    Report Status PENDING  Incomplete  Blood  culture (routine x 2)     Status: None (Preliminary result)   Collection Time: 02/21/16 12:15 PM  Result Value Ref Range Status   Specimen Description BLOOD LEFT ANTECUBITAL  Final   Special Requests BOTTLES DRAWN AEROBIC AND ANAEROBIC 5CC  Final   Culture   Final    NO GROWTH 4 DAYS Performed at [facility]    Report Status PENDING  Incomplete  Urine culture     Status: Abnormal   Collection Time: 02/21/16 12:34 PM  Result Value Ref Range Status   Specimen Description URINE, CATHETERIZED  Final   Special Requests NONE  Final   Culture 60,000 COLONIES/mL YEAST (A)  Final   Report Status 02/22/2016 FINAL  Final   Time coordinating discharge: Over 30 minutes  SIGNED:  Kerney Elbe, DO [facility] 02/26/2016, 12:18 PM Pager [phone]  If 7PM-7AM, please contact night-coverage [email or web address] Password [email or web address]

## 2016-02-26 NOTE — Progress Notes (Signed)
Pt will be transferred to SNF via [facility]. Patient changed into gown and clothing sent with patient. Family notified of transfer.

## 2016-02-27 DIAGNOSIS — D6489 Other specified anemias: Secondary | ICD-10-CM | POA: Diagnosis not present

## 2016-02-27 DIAGNOSIS — I10 Essential (primary) hypertension: Secondary | ICD-10-CM | POA: Diagnosis not present

## 2016-02-27 DIAGNOSIS — N390 Urinary tract infection, site not specified: Secondary | ICD-10-CM | POA: Diagnosis not present

## 2016-02-27 DIAGNOSIS — I7389 Other specified peripheral vascular diseases: Secondary | ICD-10-CM | POA: Diagnosis not present

## 2016-02-27 DIAGNOSIS — N183 Chronic kidney disease, stage 3 (moderate): Secondary | ICD-10-CM | POA: Diagnosis not present

## 2016-02-27 DIAGNOSIS — E43 Unspecified severe protein-calorie malnutrition: Secondary | ICD-10-CM | POA: Diagnosis not present

## 2016-02-27 DIAGNOSIS — I872 Venous insufficiency (chronic) (peripheral): Secondary | ICD-10-CM | POA: Diagnosis not present

## 2016-02-27 DIAGNOSIS — E1159 Type 2 diabetes mellitus with other circulatory complications: Secondary | ICD-10-CM | POA: Diagnosis not present

## 2016-02-27 DIAGNOSIS — E784 Other hyperlipidemia: Secondary | ICD-10-CM | POA: Diagnosis not present

## 2016-02-28 DIAGNOSIS — N390 Urinary tract infection, site not specified: Secondary | ICD-10-CM | POA: Diagnosis not present

## 2016-02-28 DIAGNOSIS — E785 Hyperlipidemia, unspecified: Secondary | ICD-10-CM | POA: Diagnosis not present

## 2016-02-28 DIAGNOSIS — N183 Chronic kidney disease, stage 3 (moderate): Secondary | ICD-10-CM | POA: Diagnosis not present

## 2016-02-28 DIAGNOSIS — I10 Essential (primary) hypertension: Secondary | ICD-10-CM | POA: Diagnosis not present

## 2016-03-12 DIAGNOSIS — N183 Chronic kidney disease, stage 3 (moderate): Secondary | ICD-10-CM | POA: Diagnosis not present

## 2016-03-12 DIAGNOSIS — I10 Essential (primary) hypertension: Secondary | ICD-10-CM | POA: Diagnosis not present

## 2016-03-12 DIAGNOSIS — N390 Urinary tract infection, site not specified: Secondary | ICD-10-CM | POA: Diagnosis not present

## 2016-03-12 DIAGNOSIS — E785 Hyperlipidemia, unspecified: Secondary | ICD-10-CM | POA: Diagnosis not present

## 2016-03-21 DIAGNOSIS — N183 Chronic kidney disease, stage 3 (moderate): Secondary | ICD-10-CM | POA: Diagnosis not present

## 2016-03-21 DIAGNOSIS — M25519 Pain in unspecified shoulder: Secondary | ICD-10-CM | POA: Diagnosis not present

## 2016-03-21 DIAGNOSIS — I10 Essential (primary) hypertension: Secondary | ICD-10-CM | POA: Diagnosis not present

## 2016-03-21 DIAGNOSIS — E785 Hyperlipidemia, unspecified: Secondary | ICD-10-CM | POA: Diagnosis not present

## 2016-03-28 DIAGNOSIS — I10 Essential (primary) hypertension: Secondary | ICD-10-CM | POA: Diagnosis not present

## 2016-03-28 DIAGNOSIS — I7389 Other specified peripheral vascular diseases: Secondary | ICD-10-CM | POA: Diagnosis not present

## 2016-03-28 DIAGNOSIS — E43 Unspecified severe protein-calorie malnutrition: Secondary | ICD-10-CM | POA: Diagnosis not present

## 2016-03-28 DIAGNOSIS — I872 Venous insufficiency (chronic) (peripheral): Secondary | ICD-10-CM | POA: Diagnosis not present

## 2016-03-28 DIAGNOSIS — N183 Chronic kidney disease, stage 3 (moderate): Secondary | ICD-10-CM | POA: Diagnosis not present

## 2016-03-28 DIAGNOSIS — E784 Other hyperlipidemia: Secondary | ICD-10-CM | POA: Diagnosis not present

## 2016-03-28 DIAGNOSIS — E1151 Type 2 diabetes mellitus with diabetic peripheral angiopathy without gangrene: Secondary | ICD-10-CM | POA: Diagnosis not present

## 2016-03-28 DIAGNOSIS — D6489 Other specified anemias: Secondary | ICD-10-CM | POA: Diagnosis not present

## 2016-03-31 DIAGNOSIS — R410 Disorientation, unspecified: Secondary | ICD-10-CM | POA: Diagnosis not present

## 2016-03-31 DIAGNOSIS — I10 Essential (primary) hypertension: Secondary | ICD-10-CM | POA: Diagnosis not present

## 2016-03-31 DIAGNOSIS — M109 Gout, unspecified: Secondary | ICD-10-CM | POA: Diagnosis not present

## 2016-03-31 DIAGNOSIS — N183 Chronic kidney disease, stage 3 (moderate): Secondary | ICD-10-CM | POA: Diagnosis not present

## 2016-04-08 ENCOUNTER — Ambulatory Visit: Payer: Medicare Other | Admitting: Podiatry

## 2016-04-09 ENCOUNTER — Encounter (HOSPITAL_COMMUNITY): Payer: Self-pay | Admitting: *Deleted

## 2016-04-09 ENCOUNTER — Inpatient Hospital Stay (HOSPITAL_COMMUNITY)
Admission: EM | Admit: 2016-04-09 | Discharge: 2016-04-16 | DRG: 682 | Disposition: A | Discharge status: Skilled Nursing Facility | Payer: Medicare Other | Attending: Internal Medicine | Admitting: Internal Medicine

## 2016-04-09 ENCOUNTER — Emergency Department (HOSPITAL_COMMUNITY): Payer: Medicare Other

## 2016-04-09 DIAGNOSIS — E870 Hyperosmolality and hypernatremia: Secondary | ICD-10-CM | POA: Diagnosis present

## 2016-04-09 DIAGNOSIS — E11622 Type 2 diabetes mellitus with other skin ulcer: Secondary | ICD-10-CM | POA: Diagnosis present

## 2016-04-09 DIAGNOSIS — R1310 Dysphagia, unspecified: Secondary | ICD-10-CM | POA: Diagnosis not present

## 2016-04-09 DIAGNOSIS — N3000 Acute cystitis without hematuria: Secondary | ICD-10-CM | POA: Diagnosis not present

## 2016-04-09 DIAGNOSIS — Z87891 Personal history of nicotine dependence: Secondary | ICD-10-CM

## 2016-04-09 DIAGNOSIS — G309 Alzheimer's disease, unspecified: Secondary | ICD-10-CM | POA: Diagnosis present

## 2016-04-09 DIAGNOSIS — B370 Candidal stomatitis: Secondary | ICD-10-CM | POA: Diagnosis present

## 2016-04-09 DIAGNOSIS — Z833 Family history of diabetes mellitus: Secondary | ICD-10-CM

## 2016-04-09 DIAGNOSIS — I878 Other specified disorders of veins: Secondary | ICD-10-CM | POA: Diagnosis present

## 2016-04-09 DIAGNOSIS — E860 Dehydration: Secondary | ICD-10-CM | POA: Diagnosis not present

## 2016-04-09 DIAGNOSIS — M109 Gout, unspecified: Secondary | ICD-10-CM | POA: Diagnosis present

## 2016-04-09 DIAGNOSIS — R41841 Cognitive communication deficit: Secondary | ICD-10-CM | POA: Diagnosis not present

## 2016-04-09 DIAGNOSIS — B9620 Unspecified Escherichia coli [E. coli] as the cause of diseases classified elsewhere: Secondary | ICD-10-CM | POA: Diagnosis present

## 2016-04-09 DIAGNOSIS — Z7982 Long term (current) use of aspirin: Secondary | ICD-10-CM | POA: Diagnosis not present

## 2016-04-09 DIAGNOSIS — F0280 Dementia in other diseases classified elsewhere without behavioral disturbance: Secondary | ICD-10-CM | POA: Diagnosis present

## 2016-04-09 DIAGNOSIS — M6281 Muscle weakness (generalized): Secondary | ICD-10-CM | POA: Diagnosis not present

## 2016-04-09 DIAGNOSIS — E1122 Type 2 diabetes mellitus with diabetic chronic kidney disease: Secondary | ICD-10-CM

## 2016-04-09 DIAGNOSIS — Z66 Do not resuscitate: Secondary | ICD-10-CM | POA: Diagnosis present

## 2016-04-09 DIAGNOSIS — N179 Acute kidney failure, unspecified: Secondary | ICD-10-CM | POA: Diagnosis not present

## 2016-04-09 DIAGNOSIS — R627 Adult failure to thrive: Secondary | ICD-10-CM | POA: Diagnosis present

## 2016-04-09 DIAGNOSIS — Z79899 Other long term (current) drug therapy: Secondary | ICD-10-CM

## 2016-04-09 DIAGNOSIS — N184 Chronic kidney disease, stage 4 (severe): Secondary | ICD-10-CM | POA: Diagnosis present

## 2016-04-09 DIAGNOSIS — N183 Chronic kidney disease, stage 3 unspecified: Secondary | ICD-10-CM | POA: Diagnosis present

## 2016-04-09 DIAGNOSIS — R531 Weakness: Secondary | ICD-10-CM | POA: Diagnosis not present

## 2016-04-09 DIAGNOSIS — R1313 Dysphagia, pharyngeal phase: Secondary | ICD-10-CM | POA: Diagnosis not present

## 2016-04-09 DIAGNOSIS — G9340 Encephalopathy, unspecified: Secondary | ICD-10-CM | POA: Diagnosis present

## 2016-04-09 DIAGNOSIS — Z8249 Family history of ischemic heart disease and other diseases of the circulatory system: Secondary | ICD-10-CM | POA: Diagnosis not present

## 2016-04-09 DIAGNOSIS — N2889 Other specified disorders of kidney and ureter: Secondary | ICD-10-CM | POA: Diagnosis not present

## 2016-04-09 DIAGNOSIS — E1129 Type 2 diabetes mellitus with other diabetic kidney complication: Secondary | ICD-10-CM | POA: Diagnosis not present

## 2016-04-09 DIAGNOSIS — N390 Urinary tract infection, site not specified: Secondary | ICD-10-CM | POA: Diagnosis present

## 2016-04-09 DIAGNOSIS — R404 Transient alteration of awareness: Secondary | ICD-10-CM | POA: Diagnosis not present

## 2016-04-09 DIAGNOSIS — L97929 Non-pressure chronic ulcer of unspecified part of left lower leg with unspecified severity: Secondary | ICD-10-CM | POA: Diagnosis present

## 2016-04-09 DIAGNOSIS — E118 Type 2 diabetes mellitus with unspecified complications: Secondary | ICD-10-CM | POA: Diagnosis not present

## 2016-04-09 DIAGNOSIS — Z87440 Personal history of urinary (tract) infections: Secondary | ICD-10-CM | POA: Diagnosis not present

## 2016-04-09 DIAGNOSIS — A419 Sepsis, unspecified organism: Secondary | ICD-10-CM | POA: Diagnosis not present

## 2016-04-09 LAB — URINALYSIS, ROUTINE W REFLEX MICROSCOPIC
BILIRUBIN URINE: NEGATIVE
GLUCOSE, UA: NEGATIVE mg/dL
Ketones, ur: NEGATIVE mg/dL
NITRITE: NEGATIVE
Protein, ur: 100 mg/dL — AB
SPECIFIC GRAVITY, URINE: 1.014 (ref 1.005–1.030)
pH: 6 (ref 5.0–8.0)

## 2016-04-09 LAB — COMPREHENSIVE METABOLIC PANEL
ALBUMIN: 3.4 g/dL — AB (ref 3.5–5.0)
ALK PHOS: 120 U/L (ref 38–126)
ALT: 10 U/L — ABNORMAL LOW (ref 14–54)
ANION GAP: 10 (ref 5–15)
AST: 25 U/L (ref 15–41)
BUN: 66 mg/dL — ABNORMAL HIGH (ref 6–20)
CALCIUM: 13.5 mg/dL — AB (ref 8.9–10.3)
CO2: 30 mmol/L (ref 22–32)
Chloride: 102 mmol/L (ref 101–111)
Creatinine, Ser: 2.56 mg/dL — ABNORMAL HIGH (ref 0.44–1.00)
GFR calc non Af Amer: 15 mL/min — ABNORMAL LOW (ref >60)
GFR, EST AFRICAN AMERICAN: 17 mL/min — AB (ref >60)
GLUCOSE: 157 mg/dL — AB (ref 65–99)
POTASSIUM: 3.6 mmol/L (ref 3.5–5.1)
SODIUM: 142 mmol/L (ref 135–145)
Total Bilirubin: 0.7 mg/dL (ref 0.3–1.2)
Total Protein: 8.2 g/dL — ABNORMAL HIGH (ref 6.5–8.1)

## 2016-04-09 LAB — CBC WITH DIFFERENTIAL/PLATELET
BASOS PCT: 0 %
Basophils Absolute: 0 10*3/uL (ref 0.0–0.1)
EOS ABS: 0.1 10*3/uL (ref 0.0–0.7)
EOS PCT: 1 %
HCT: 40.5 % (ref 36.0–46.0)
Hemoglobin: 13.3 g/dL (ref 12.0–15.0)
LYMPHS ABS: 2 10*3/uL (ref 0.7–4.0)
Lymphocytes Relative: 21 %
MCH: 28.9 pg (ref 26.0–34.0)
MCHC: 32.8 g/dL (ref 30.0–36.0)
MCV: 87.9 fL (ref 78.0–100.0)
MONO ABS: 0.7 10*3/uL (ref 0.1–1.0)
MONOS PCT: 7 %
NEUTROS PCT: 71 %
Neutro Abs: 6.7 10*3/uL (ref 1.7–7.7)
PLATELETS: 292 10*3/uL (ref 150–400)
RBC: 4.61 MIL/uL (ref 3.87–5.11)
RDW: 14.1 % (ref 11.5–15.5)
WBC: 9.5 10*3/uL (ref 4.0–10.5)

## 2016-04-09 LAB — GLUCOSE, CAPILLARY: Glucose-Capillary: 121 mg/dL — ABNORMAL HIGH (ref 65–99)

## 2016-04-09 MED ORDER — ACETAMINOPHEN 650 MG RE SUPP: 650.0000 mg | Freq: Four times a day (QID) | RECTAL | Status: DC | PRN

## 2016-04-09 MED ORDER — CEFTRIAXONE SODIUM 1 G IJ SOLR
1.0000 g | Freq: Once | INTRAMUSCULAR | Status: AC
  Administered 2016-04-09: 1 g via INTRAVENOUS
  Filled 2016-04-09: qty 10

## 2016-04-09 MED ORDER — ACETAMINOPHEN 325 MG PO TABS
650.0000 mg | ORAL_TABLET | Freq: Four times a day (QID) | ORAL | Status: DC | PRN
  Administered 2016-04-10 – 2016-04-11 (×2): 650 mg via ORAL
  Filled 2016-04-09 (×2): qty 2

## 2016-04-09 MED ORDER — HEPARIN SODIUM (PORCINE) 5000 UNIT/ML IJ SOLN
5000.0000 [IU] | Freq: Three times a day (TID) | INTRAMUSCULAR | Status: DC
  Administered 2016-04-09 – 2016-04-16 (×21): 5000 [IU] via SUBCUTANEOUS
  Filled 2016-04-09 (×20): qty 1

## 2016-04-09 MED ORDER — SODIUM CHLORIDE 0.9 % IV BOLUS (SEPSIS)
250.0000 mL | Freq: Once | INTRAVENOUS | Status: AC
  Administered 2016-04-09: 250 mL via INTRAVENOUS

## 2016-04-09 MED ORDER — SODIUM CHLORIDE 0.9 % IV SOLN
INTRAVENOUS | Status: DC
  Administered 2016-04-09 – 2016-04-10 (×3): via INTRAVENOUS

## 2016-04-09 MED ORDER — DEXTROSE 5 % IV SOLN
1.0000 g | INTRAVENOUS | Status: DC
  Administered 2016-04-10 – 2016-04-13 (×4): 1 g via INTRAVENOUS
  Filled 2016-04-09 (×4): qty 10

## 2016-04-09 MED ORDER — SODIUM CHLORIDE 0.9 % IV BOLUS (SEPSIS)
500.0000 mL | Freq: Once | INTRAVENOUS | Status: AC
  Administered 2016-04-09: 500 mL via INTRAVENOUS

## 2016-04-09 MED ORDER — INSULIN ASPART 100 UNIT/ML ~~LOC~~ SOLN
0.0000 [IU] | Freq: Three times a day (TID) | SUBCUTANEOUS | Status: DC
  Administered 2016-04-10: 1 [IU] via SUBCUTANEOUS
  Administered 2016-04-10: 2 [IU] via SUBCUTANEOUS
  Administered 2016-04-11: 1 [IU] via SUBCUTANEOUS

## 2016-04-09 NOTE — ED Notes (Signed)
Dr. Darreld Mclean notified that urine was purulent in appearance when pt was I&O cath, also that she has abnormal labs including abnormal calcium

## 2016-04-09 NOTE — H&P (Signed)
History and Physical  Jill Allen [id number] DOB: 12/04/1915 DOA: 04/09/2016  PCP: [name], MD  Patient coming from: [facility]  Chief Complaint: Generalized weakness  HPI:  82 year old woman currently at short-term rehabilitation, presents with failure to thrive, confusion. Initial evaluation revealed acute kidney injury, severe dehydration, oral thrush, UTI.  Patient confused, cannot provide any history. Nephew/healthcare power of attorney at the bedside. He provides all history. Patient was living alone prior to November. She was hospitalized for UTI at that time and subsequently went to [facility] for rehabilitation. She initially did well but for approximately the last 4 days she's developed increasing confusion, failure to thrive, very poor appetite, very difficult to get her to drink anything. She's had no specific complaints. He is unaware of any concerns on the facilities heart and really is unaware of further history.  ED Course: Afebrile, vital signs stable. Treated with IV fluids and ceftriaxone. Pertinent labs: Calcium 13.5, BUN 66, creatinine 2.56. CBC unremarkable. Urinalysis grossly abnormal. EKG: Independently reviewed. Poor quality, neuro complex, rate 60, irregular, atrial fibrillation versus sinus arrhythmia. Imaging: Chest x-ray independently reviewed. No acute disease.  Review of Systems:  Unobtainable except as above.  Past Medical History:  Diagnosis Date  . Diabetes ([place])    SLOW TO HEAL  . Gout   . HBP (high blood pressure)   . Kidney disease   . Poor circulation     Past Surgical History:  Procedure Laterality Date  . APPENDECTOMY    . CATARACT EXTRACTION Bilateral      reports that she quit smoking about 43 years ago. Her smoking use included Cigarettes. She has never used smokeless tobacco. She reports that she does not drink alcohol or use drugs.   No Known Allergies  Family History  Problem Relation Age of Onset  . Diabetes  Mother   . Hypertension Brother   . Cancer Neg Hx      Prior to Admission medications   Medication Sig Start Date End Date Taking? Authorizing Provider  acetaminophen (TYLENOL) 500 MG tablet Take 1,000 mg by mouth every 8 (eight) hours as needed for moderate pain.    Yes Historical Provider, MD  Amino Acids-Protein Hydrolys (FEEDING SUPPLEMENT, PRO-STAT SUGAR FREE 64,) LIQD Take 30 mLs by mouth 2 (two) times daily.   Yes Historical Provider, MD  amLODipine (NORVASC) 10 MG tablet Take 10 mg by mouth daily.  08/09/14  Yes Historical Provider, MD  aspirin EC 81 MG tablet Take 81 mg by mouth daily.   Yes Historical Provider, MD  atorvastatin (LIPITOR) 40 MG tablet Take 40 mg by mouth daily.  09/24/13  Yes Historical Provider, MD  cefTRIAXone (ROCEPHIN) 1 g injection Inject 1 g into the muscle daily. For 5 Days. Start Date 04/08/16 & End Date 04/12/16.   Yes Historical Provider, MD  ergocalciferol (VITAMIN D2) 50000 units capsule Take 50,000 Units by mouth 2 (two) times daily.   Yes Historical Provider, MD  lidocaine (XYLOCAINE) 1 % (with preservative) injection as directed. 1 % vial. Mix 2.1 ml with 1 vial of Rocephin For 5 Days. Start Date 04/08/16 & End Date 04/12/16. 04/08/16 04/12/16 Yes Historical Provider, MD  magnesium oxide (MAG-OX) 400 (241.3 Mg) MG tablet Take 1 tablet (400 mg total) by mouth 2 (two) times daily. 02/26/16  Yes Sel[place], DO  metoprolol succinate (TOPROL-XL) 100 MG 24 hr tablet Take 100 mg by mouth daily. Take with or immediately following a meal.   Yes Historical Provider, MD  Multiple  Vitamins-Minerals (OCUVITE EXTRA) TABS Take 1 tablet by mouth daily.   Yes Historical Provider, MD  Nutritional Supplements (NUTRITIONAL DRINK PO) Take 120 mLs by mouth 3 (three) times daily.   Yes Historical Provider, MD  vitamin C (ASCORBIC ACID) 500 MG tablet Take 500 mg by mouth 2 (two) times daily.   Yes Historical Provider, MD  fluconazole (DIFLUCAN) 100 MG tablet Take 1 tablet  (100 mg total) by mouth daily. Patient not taking: Reported on 04/09/2016 02/26/16   [place], DO  LIDOCAINE HCL IJ Inject as directed daily. 1 % vial. Mix 2.1 ml with 1 vial of Rocephin For 5 Days. Start Date 04/08/16 & End Date 04/12/16.    Historical Provider, MD    Physical Exam: Vitals:   04/09/16 1032 04/09/16 1301 04/09/16 1302 04/09/16 1440  BP: 148/74 132/74 132/74 160/66  Pulse: (!) 59 67 60 (!) 55  Resp: 16 13 11 15   Temp: 97.8 F (36.6 C)   97.6 F (36.4 C)  TempSrc: Oral   Oral  SpO2: 100% 94% 96% 100%  Weight: 85.7 kg (189 lb)       Constitutional:  . Appears calm and comfortable. Appears ill but nontoxic. Eyes:  . Pupils and irises appear normal . Normal lids ENMT:  . external ears, nose appear normal . grossly normal hearing . Lips appear normal . Oropharynx: Mucosa and tongue very dry, large amount of exudate caked on palate and tongue. Neck:  . neck appears normal, no masses . no thyromegaly Respiratory:  . CTA bilaterally, no w/r/r.  . Respiratory effort normal. No retractions or accessory muscle use Cardiovascular:  . RRR, no m/r/g . No LE extremity edema   Abdomen:  . Abdomen appears normal; no tenderness or masses . No hernias noted Musculoskeletal:  . Digits/nails: Bilateral upper extremities: no clubbing, cyanosis, petechiae, infection . RUE, LUE, RLE, LLE   o Appears to be quite weak, no definite focal deficit noted o Bilateral lower extremity is or tender . Boots present. Lower extremity wounds are noted. Skin:  . Lower extremity wounds noted . palpation of skin: no induration or nodules Psychiatric:  o Cannot assess mood, affect, judgment or insight  Wt Readings from Last 3 Encounters:  04/09/16 85.7 kg (189 lb)  02/21/16 86.1 kg (189 lb 12.8 oz)  02/08/16 91.5 kg (201 lb 11.5 oz)    I have personally reviewed following labs and imaging studies  Labs on Admission:  CBC:  Recent Labs Lab 04/09/16 1135  WBC 9.5    NEUTROABS 6.7  HGB 13.3  HCT 40.5  MCV 87.9  PLT 123456   Basic Metabolic Panel:  Recent Labs Lab 04/09/16 1135  NA 142  K 3.6  CL 102  CO2 30  GLUCOSE 157*  BUN 66*  CREATININE 2.56*  CALCIUM 13.5*   Liver Function Tests:  Recent Labs Lab 04/09/16 1135  AST 25  ALT 10*  ALKPHOS 120  BILITOT 0.7  PROT 8.2*  ALBUMIN 3.4*   Urine analysis:    Component Value Date/Time   COLORURINE YELLOW 04/09/2016 1230   APPEARANCEUR TURBID (A) 04/09/2016 1230   LABSPEC 1.014 04/09/2016 1230   PHURINE 6.0 04/09/2016 1230   GLUCOSEU NEGATIVE 04/09/2016 1230   HGBUR MODERATE (A) 04/09/2016 1230   BILIRUBINUR NEGATIVE 04/09/2016 1230   KETONESUR NEGATIVE 04/09/2016 1230   PROTEINUR 100 (A) 04/09/2016 1230   NITRITE NEGATIVE 04/09/2016 1230   LEUKOCYTESUR LARGE (A) 04/09/2016 1230    Radiological Exams on Admission:  Dg Chest Port 1 View  Result Date: 04/09/2016 CLINICAL DATA:  Generalized weakness. EXAM: PORTABLE CHEST 1 VIEW COMPARISON:  February 21, 2016 FINDINGS: Mild opacity in the lateral left lung base is favored to represent atelectasis. No other interval changes or acute abnormalities. IMPRESSION: Mild opacity in the lateral left lung base may represent atelectasis. A PA and lateral chest x-ray could better evaluate. Electronically Signed   By: Dorise Bullion III M.D   On: 04/09/2016 11:19     Principal Problem:   AKI (acute kidney injury) ([place]) Active Problems:   UTI (urinary tract infection)   Dehydration   CKD (chronic kidney disease), stage III   DM (diabetes mellitus), type 2 with renal complications (H[facility])   Hypercalcemia   Assessment/Plan 1. Acute kidney injury, prerenal pattern, with marked hypercalcemia  Suspect all secondary to marked failure to thrive, poor oral intake.  Aggressive IV fluids. 2. Marked hypercalcemia, normal 02/25/2016. Suspect related to dehydration. Aggressive IV fluids. Recheck BMP in the morning. 3. UTI. Empiric antibiotics.  Follow-up culture data. 4. Possible atrial fibrillation. EKG poor quality. Telemetry appears to be sinus rhythm. Follow clinically. Rate is normal. 5. Diabetes mellitus, random blood sugar 157. Anion gap normal. 6. Chronic kidney disease stage III 7. Venous stasis ulcers present on admission.   DVT prophylaxis:heparin Code Status: DNR/DNI Family Communication: nephew/HCPOA at bedside Disposition Plan: Likely return to [facility] Consults called: none  It is my clinical opinion that admission to INPATIENT is reasonable and necessary in this patient . presenting with symptoms of confusion, poor oral intake, concerning for UTI  . in the context of PMH including: Recurrent UTI  . with pertinent positives on physical exam including: Mucous membranes dry, confused . and pertinent positives on radiographic and laboratory data including: Acute kidney injury, markedly elevated calcium, UTI  Given the aforementioned, the predictability of an adverse outcome is felt to be significant. I expect that the patient will require at least 2 midnights in the hospital to treat this condition.     Time spent: 50 minutes  Murray Hodgkins, MD  [facility] Direct contact: [phone] --[place] [place]  --[email or web address]; password [email or web address]  7PM-7AM contact night coverage as above  04/09/2016, 4:39 PM

## 2016-04-09 NOTE — ED Provider Notes (Signed)
[facility] DEPT Provider Note   CSN: [id number] Arrival date & time: 04/09/16  1000     History   Chief Complaint Chief Complaint  Patient presents with  . Weakness    HPI Jill Allen is a 82 y.o. female.  HPI Patient presents from nursing home for generalized weakness and decreased alertness. EMS was called to transport patient patient had stable vital signs en route. Had mildly dysarthric speech but was easily aroused. Complains of generalized fatigue. Denies any chest pain or abdominal pain. History limited due to confusion. Vital 5 caveat applies.. Past Medical History:  Diagnosis Date  . Diabetes ([place])    SLOW TO HEAL  . Gout   . HBP (high blood pressure)   . Kidney disease   . Poor circulation     Patient Active Problem List   Diagnosis Date Noted  . Pressure injury of skin 02/22/2016  . Sepsis ([place]) 02/21/2016  . UTI (urinary tract infection) 02/06/2016  . Dehydration 02/06/2016  . Acute renal failure (ARF) ([place]) 02/06/2016  . CKD (chronic kidney disease), stage III 02/06/2016  . DM (diabetes mellitus), type 2 with renal complications ([place]) 0000000  . Venous stasis 02/06/2016  . PAOD (peripheral arterial occlusive disease) ([place]) 11/03/2014  . Chronic venous insufficiency 08/18/2014  . Venous stasis ulcer of right lower extremity ([place]) 08/18/2014  . Venous stasis ulcer of left lower extremity ([place]) 08/18/2014    Past Surgical History:  Procedure Laterality Date  . APPENDECTOMY    . CATARACT EXTRACTION Bilateral     OB History    No data available       Home Medications    Prior to Admission medications   Medication Sig Start Date End Date Taking? Authorizing Provider  acetaminophen (TYLENOL) 500 MG tablet Take 1,000 mg by mouth every 8 (eight) hours as needed for moderate pain.    Yes Historical Provider, MD  Amino Acids-Protein Hydrolys (FEEDING SUPPLEMENT, PRO-STAT SUGAR FREE 64,) LIQD Take 30 mLs by mouth 2 (two) times daily.   Yes  Historical Provider, MD  amLODipine (NORVASC) 10 MG tablet Take 10 mg by mouth daily.  08/09/14  Yes Historical Provider, MD  aspirin EC 81 MG tablet Take 81 mg by mouth daily.   Yes Historical Provider, MD  atorvastatin (LIPITOR) 40 MG tablet Take 40 mg by mouth daily.  09/24/13  Yes Historical Provider, MD  cefTRIAXone (ROCEPHIN) 1 g injection Inject 1 g into the muscle daily. For 5 Days. Start Date 04/08/16 & End Date 04/12/16.   Yes Historical Provider, MD  ergocalciferol (VITAMIN D2) 50000 units capsule Take 50,000 Units by mouth 2 (two) times daily.   Yes Historical Provider, MD  lidocaine (XYLOCAINE) 1 % (with preservative) injection as directed. 1 % vial. Mix 2.1 ml with 1 vial of Rocephin For 5 Days. Start Date 04/08/16 & End Date 04/12/16. 04/08/16 04/12/16 Yes Historical Provider, MD  magnesium oxide (MAG-OX) 400 (241.3 Mg) MG tablet Take 1 tablet (400 mg total) by mouth 2 (two) times daily. 02/26/16  Yes [place], DO  metoprolol succinate (TOPROL-XL) 100 MG 24 hr tablet Take 100 mg by mouth daily. Take with or immediately following a meal.   Yes Historical Provider, MD  Multiple Vitamins-Minerals (OCUVITE EXTRA) TABS Take 1 tablet by mouth daily.   Yes Historical Provider, MD  Nutritional Supplements (NUTRITIONAL DRINK PO) Take 120 mLs by mouth 3 (three) times daily.   Yes Historical Provider, MD  vitamin C (ASCORBIC ACID) 500 MG tablet  Take 500 mg by mouth 2 (two) times daily.   Yes Historical Provider, MD  fluconazole (DIFLUCAN) 100 MG tablet Take 1 tablet (100 mg total) by mouth daily. Patient not taking: Reported on 04/09/2016 02/26/16   Glen Allen, DO  LIDOCAINE HCL IJ Inject as directed daily. 1 % vial. Mix 2.1 ml with 1 vial of Rocephin For 5 Days. Start Date 04/08/16 & End Date 04/12/16.    Historical Provider, MD    Family History Family History  Problem Relation Age of Onset  . Diabetes Mother   . Hypertension Brother   . Cancer Neg Hx     Social  History Social History  Substance Use Topics  . Smoking status: Former Smoker    Types: Cigarettes    Quit date: 01/19/1973  . Smokeless tobacco: Never Used  . Alcohol use No     Allergies   Patient has no known allergies.   Review of Systems Review of Systems  Unable to perform ROS: Dementia  Constitutional: Positive for fatigue.  Cardiovascular: Negative for chest pain.  Gastrointestinal: Negative for abdominal pain.  Neurological: Positive for weakness (generalized).  Psychiatric/Behavioral: Positive for confusion.     Physical Exam Updated Vital Signs BP 132/74   Pulse 67   Temp 97.8 F (36.6 C) (Oral)   Resp 13   Wt 189 lb (85.7 kg)   SpO2 94%   BMI 33.48 kg/m   Physical Exam  Constitutional: She appears well-developed and well-nourished. No distress.  HENT:  Head: Normocephalic and atraumatic.  Very dry mucous membranes with thick white coating of the posterior oropharynx  Eyes: EOM are normal. Pupils are equal, round, and reactive to light.  Neck: Normal range of motion. Neck supple.  No meningismus  Cardiovascular: Normal rate and regular rhythm.   Pulmonary/Chest: Effort normal and breath sounds normal. No respiratory distress. She has no wheezes. She has no rales. She exhibits no tenderness.  Abdominal: Soft. Bowel sounds are normal. There is no tenderness. There is no rebound and no guarding.  Musculoskeletal: Normal range of motion. She exhibits no edema or tenderness.  Left ankle ace wrap in place. No definite lower extremity swelling or asymmetry.  Neurological:  Drowsy but easily aroused. Oriented to self. Mildly garbled speech pattern. 4/5 motor in all extremities but sensation appears intact.  Skin: Skin is warm and dry. No rash noted. She is not diaphoretic. No erythema.  Nursing note and vitals reviewed.    ED Treatments / Results  Labs (all labs ordered are listed, but only abnormal results are displayed) Labs Reviewed  COMPREHENSIVE  METABOLIC PANEL - Abnormal; Notable for the following:       Result Value   Glucose, Bld 157 (*)    BUN 66 (*)    Creatinine, Ser 2.56 (*)    Calcium 13.5 (*)    Total Protein 8.2 (*)    Albumin 3.4 (*)    ALT 10 (*)    GFR calc non Af Amer 15 (*)    GFR calc Af Amer 17 (*)    All other components within normal limits  URINALYSIS, ROUTINE W REFLEX MICROSCOPIC - Abnormal; Notable for the following:    APPearance TURBID (*)    Hgb urine dipstick MODERATE (*)    Protein, ur 100 (*)    Leukocytes, UA LARGE (*)    Bacteria, UA MANY (*)    Squamous Epithelial / LPF TOO NUMEROUS TO COUNT (*)    Non Squamous Epithelial 0-5 (*)  All other components within normal limits  URINE CULTURE  CBC WITH DIFFERENTIAL/PLATELET    EKG  EKG Interpretation  Date/Time:  Wednesday April 09 2016 11:39:15 EST Ventricular Rate:  60 PR Interval:    QRS Duration: 100 QT Interval:  453 QTC Calculation: 453 R Axis:   -26 Text Interpretation:  Atrial fibrillation Left ventricular hypertrophy Inferior infarct, old Confirmed by Lita Mains  MD, Avalynn Bowe ([id number]) on 04/09/2016 2:32:41 PM       Radiology Dg Chest Port 1 View  Result Date: 04/09/2016 CLINICAL DATA:  Generalized weakness. EXAM: PORTABLE CHEST 1 VIEW COMPARISON:  February 21, 2016 FINDINGS: Mild opacity in the lateral left lung base is favored to represent atelectasis. No other interval changes or acute abnormalities. IMPRESSION: Mild opacity in the lateral left lung base may represent atelectasis. A PA and lateral chest x-ray could better evaluate. Electronically Signed   By: Dorise Bullion III M.D   On: 04/09/2016 11:19    Procedures Procedures (including critical care time)  Medications Ordered in ED Medications  sodium chloride 0.9 % bolus 500 mL (0 mLs Intravenous Stopped 04/09/16 1310)  sodium chloride 0.9 % bolus 250 mL (250 mLs Intravenous New Bag/Given 04/09/16 1345)  cefTRIAXone (ROCEPHIN) 1 g in dextrose 5 % 50 mL IVPB (1 g  Intravenous New Bag/Given 04/09/16 1344)     Initial Impression / Assessment and Plan / ED Course  I have reviewed the triage vital signs and the nursing notes.  Pertinent labs & imaging results that were available during my care of the patient were reviewed by me and considered in my medical decision making (see chart for details).  Clinical Course    Patient is very dry appearing. Question thrush/esophagitis. This may be responsible for the patient's speech. Likely has had decreased by mouth intake. We'll initiate rehydration and screen for other causes of generalized weakness. Symptoms of dehydration and UTI. Initiate antibiotics. Discussed with Dr. Sarajane Jews and will admit to MedSurg bed. Final Clinical Impressions(s) / ED Diagnoses   Final diagnoses:  Dehydration  Acute lower UTI    New Prescriptions New Prescriptions   No medications on file     Julianne Rice, MD 04/09/16 1432

## 2016-04-09 NOTE — ED Triage Notes (Signed)
Pt here from [facility] for generalized weakness.  Staff reported [facility] out of her normal.  For ems pt was alert and oriented to self.  Pt has chronic muscle weakness and cognitive communication deficit and chronic UTI.

## 2016-04-09 NOTE — ED Notes (Signed)
Bed: WA04 Expected date:  Expected time:  Means of arrival:  Comments: EMS-weakness 

## 2016-04-09 NOTE — ED Notes (Signed)
Transporter called for transport.

## 2016-04-09 NOTE — ED Notes (Signed)
Transporter called for transport to floor.

## 2016-04-09 NOTE — ED Notes (Signed)
Nurse is going to put in IV and collect labs on the patient

## 2016-04-09 NOTE — ED Notes (Signed)
Hospitalist at bedside 

## 2016-04-10 ENCOUNTER — Inpatient Hospital Stay (HOSPITAL_COMMUNITY): Payer: Medicare Other

## 2016-04-10 DIAGNOSIS — N390 Urinary tract infection, site not specified: Secondary | ICD-10-CM

## 2016-04-10 LAB — GLUCOSE, CAPILLARY
Glucose-Capillary: 113 mg/dL — ABNORMAL HIGH (ref 65–99)
Glucose-Capillary: 121 mg/dL — ABNORMAL HIGH (ref 65–99)
Glucose-Capillary: 156 mg/dL — ABNORMAL HIGH (ref 65–99)
Glucose-Capillary: 135 mg/dL — ABNORMAL HIGH (ref 65–99)

## 2016-04-10 LAB — BASIC METABOLIC PANEL
Anion gap: 10 (ref 5–15)
BUN: 57 mg/dL — AB (ref 6–20)
CALCIUM: 12.4 mg/dL — AB (ref 8.9–10.3)
CO2: 27 mmol/L (ref 22–32)
Chloride: 108 mmol/L (ref 101–111)
Creatinine, Ser: 2.08 mg/dL — ABNORMAL HIGH (ref 0.44–1.00)
GFR calc Af Amer: 22 mL/min — ABNORMAL LOW (ref >60)
GFR calc non Af Amer: 19 mL/min — ABNORMAL LOW (ref >60)
Glucose, Bld: 126 mg/dL — ABNORMAL HIGH (ref 65–99)
POTASSIUM: 2.8 mmol/L — AB (ref 3.5–5.1)
SODIUM: 145 mmol/L (ref 135–145)

## 2016-04-10 LAB — CBC
HCT: 39.8 % (ref 36.0–46.0)
HEMOGLOBIN: 13.1 g/dL (ref 12.0–15.0)
MCH: 28.8 pg (ref 26.0–34.0)
MCHC: 32.9 g/dL (ref 30.0–36.0)
MCV: 87.5 fL (ref 78.0–100.0)
Platelets: 278 10*3/uL (ref 150–400)
RBC: 4.55 MIL/uL (ref 3.87–5.11)
RDW: 13.9 % (ref 11.5–15.5)
WBC: 10.2 10*3/uL (ref 4.0–10.5)

## 2016-04-10 LAB — TSH: TSH: 0.378 u[IU]/mL (ref 0.350–4.500)

## 2016-04-10 LAB — MRSA PCR SCREENING: MRSA BY PCR: NEGATIVE

## 2016-04-10 MED ORDER — POTASSIUM CHLORIDE CRYS ER 20 MEQ PO TBCR
40.0000 meq | EXTENDED_RELEASE_TABLET | Freq: Three times a day (TID) | ORAL | Status: AC
  Administered 2016-04-10 (×3): 40 meq via ORAL
  Filled 2016-04-10 (×3): qty 2

## 2016-04-10 MED ORDER — MAGNESIUM OXIDE 400 (241.3 MG) MG PO TABS
400.0000 mg | ORAL_TABLET | Freq: Two times a day (BID) | ORAL | Status: DC
  Administered 2016-04-10 – 2016-04-16 (×13): 400 mg via ORAL
  Filled 2016-04-10 (×13): qty 1

## 2016-04-10 MED ORDER — POTASSIUM CHLORIDE IN NACL 40-0.9 MEQ/L-% IV SOLN
INTRAVENOUS | Status: DC
  Administered 2016-04-10 – 2016-04-11 (×3): 100 mL/h via INTRAVENOUS
  Filled 2016-04-10 (×4): qty 1000

## 2016-04-10 MED ORDER — FUROSEMIDE 10 MG/ML IJ SOLN
40.0000 mg | Freq: Once | INTRAMUSCULAR | Status: AC
  Administered 2016-04-10: 40 mg via INTRAVENOUS
  Filled 2016-04-10: qty 4

## 2016-04-10 MED ORDER — HYDRALAZINE HCL 20 MG/ML IJ SOLN
5.0000 mg | INTRAMUSCULAR | Status: DC | PRN
  Administered 2016-04-10 – 2016-04-14 (×5): 5 mg via INTRAVENOUS
  Filled 2016-04-10 (×6): qty 1

## 2016-04-10 NOTE — Progress Notes (Signed)
PT Cancellation Note  Patient Details Name: Jill Allen MRN: [id number] DOB: [birth year]   Cancelled Treatment:    Reason Eval/Treat Not Completed: PT screened, no needs identified, will sign off.  Reviewed chart and pt is from SNF and plan is to d/c SNF.   [name] 04/10/2016, 3:22 PM

## 2016-04-10 NOTE — Clinical Social Work Note (Signed)
Clinical Social Work Assessment  Patient Details  Name: Jill Allen MRN: [id number] Date of Birth: July 08, 1915  Date of referral:  04/10/16               Reason for consult:  Facility Placement                Permission sought to share information with:  Facility Art therapist granted to share information::  Yes, Verbal Permission Granted  Name::        Agency::     Relationship::     Contact Information:     Housing/Transportation Living arrangements for the past 2 months:  [place] of Information:  Other (Comment Required) (patient's nephew, Elenore Rota via phone) Patient Interpreter Needed:  None Criminal Activity/Legal Involvement Pertinent to Current Situation/Hospitalization:  No - Comment as needed Significant Relationships:  Other Family Members Lives with:  Facility Resident Do you feel safe going back to the place where you live?  No Need for family participation in patient care:  Yes (Comment)  Care giving concerns:  CSW received consult that patient was admitted from [facility]    Social Worker assessment / plan:  CSW spoke with patient's nephew, Elenore Rota via phone - nephew states that he would prefer patient go to [facility] as it is closer to the family. CSW confirmed with Narda Rutherford at [facility] that patient used 43 days at [facility] (11/14 - 12/27). CSW awaiting response from [place] [facility] re: bed offer.   Employment status:  Retired Forensic scientist:  Medicare PT Recommendations:  Not assessed at this time Information / Referral to community resources:  [place]  Patient/Family's Response to care:  Patient's nephew states that he was not unhappy with [facility], but just would prefer that she be closer to the family.   Patient/Family's Understanding of and Emotional Response to Diagnosis, Current Treatment, and Prognosis:    Emotional Assessment Appearance:  Appears stated  age Attitude/Demeanor/Rapport:    Affect (typically observed):    Orientation:  Oriented to Self Alcohol / Substance use:    Psych involvement (Current and /or in the community):     Discharge Needs  Concerns to be addressed:    Readmission within the last 30 days:    Current discharge risk:    Barriers to Discharge:      Standley Brooking, LCSW 04/10/2016, 3:06 PM

## 2016-04-10 NOTE — NC FL2 (Signed)
[place] LEVEL OF CARE SCREENING TOOL     IDENTIFICATION  Patient Name: Jill Allen Birthdate: 04/17/1915 Sex: female Admission Date (Current Location): 04/09/2016  [facility] and [place] Number:  Herbalist and Address:  [facility],  [place] [address], [place]      Provider Number: [id number]  Attending Physician Name and Address:  Reyne Dumas, MD  Relative Name and Phone Number:       Current Level of Care: Hospital Recommended Level of Care: [place] Prior Approval Number:    Date Approved/Denied:   PASRR Number: [id number]  Discharge Plan: SNF    Current Diagnoses: Patient Active Problem List   Diagnosis Date Noted  . Acute lower UTI   . AKI (acute kidney injury) ([place]) 04/09/2016  . Hypercalcemia 04/09/2016  . Pressure injury of skin 02/22/2016  . Sepsis ([place]) 02/21/2016  . UTI (urinary tract infection) 02/06/2016  . Dehydration 02/06/2016  . Acute renal failure (ARF) ([place]) 02/06/2016  . CKD (chronic kidney disease), stage III 02/06/2016  . DM (diabetes mellitus), type 2 with renal complications ([place]) 0000000  . Venous stasis 02/06/2016  . PAOD (peripheral arterial occlusive disease) ([place]) 11/03/2014  . Chronic venous insufficiency 08/18/2014  . Venous stasis ulcer of right lower extremity ([place]) 08/18/2014  . Venous stasis ulcer of left lower extremity ([place]) 08/18/2014    Orientation RESPIRATION BLADDER Height & Weight     Self  Normal Continent Weight: 169 lb 1.5 oz (76.7 kg) Height:  5\' 3"  (160 cm)  BEHAVIORAL SYMPTOMS/MOOD NEUROLOGICAL BOWEL NUTRITION STATUS      Continent Diet (Dys 1 Diet)  AMBULATORY STATUS COMMUNICATION OF NEEDS Skin   Extensive Assist Verbally Normal                       Personal Care Assistance Level of Assistance  Bathing, Dressing Bathing Assistance: Limited assistance   Dressing Assistance: Limited assistance     Functional Limitations Info              SPECIAL CARE FACTORS FREQUENCY  PT (By licensed PT), OT (By licensed OT)     PT Frequency: 5 OT Frequency: 5            Contractures      Additional Factors Info  Code Status, Allergies Code Status Info: DNR Allergies Info: NKDA           Current Medications (04/10/2016):  This is the current hospital active medication list Current Facility-Administered Medications  Medication Dose Route Frequency Provider Last Rate Last Dose  . 0.9 % NaCl with KCl 40 mEq / L  infusion   Intravenous Continuous Reyne Dumas, MD 100 mL/hr at 04/10/16 1146 100 mL/hr at 04/10/16 1146  . acetaminophen (TYLENOL) tablet 650 mg  650 mg Oral Q6H PRN Samuella Cota, MD       Or  . acetaminophen (TYLENOL) suppository 650 mg  650 mg Rectal Q6H PRN Samuella Cota, MD      . cefTRIAXone (ROCEPHIN) 1 g in dextrose 5 % 50 mL IVPB  1 g Intravenous Q24H Samuella Cota, MD   1 g at 04/10/16 1333  . heparin injection 5,000 Units  5,000 Units Subcutaneous Q8H Samuella Cota, MD   5,000 Units at 04/10/16 1334  . hydrALAZINE (APRESOLINE) injection 5 mg  5 mg Intravenous Q4H PRN Gardiner Barefoot, NP   5 mg at 04/10/16 0655  . insulin aspart (  novoLOG) injection 0-9 Units  0-9 Units Subcutaneous TID WC Samuella Cota, MD   1 Units at 04/10/16 1334  . magnesium oxide (MAG-OX) tablet 400 mg  400 mg Oral BID Reyne Dumas, MD   400 mg at 04/10/16 1117  . potassium chloride SA (K-DUR,KLOR-CON) CR tablet 40 mEq  40 mEq Oral TID Reyne Dumas, MD   40 mEq at 04/10/16 1016     Discharge Medications: Please see discharge summary for a list of discharge medications.  Relevant Imaging Results:  Relevant Lab Results:   Additional Information SS# [id number]  Standley Brooking, LCSW

## 2016-04-10 NOTE — Evaluation (Signed)
Clinical/Bedside Swallow Evaluation Patient Details  Name: Jill Allen MRN: [id number] Date of Birth: 1915-12-18  Today's Date: 04/10/2016 Time: SLP Start Time (ACUTE ONLY): 1125 SLP Stop Time (ACUTE ONLY): 1153 SLP Time Calculation (min) (ACUTE ONLY): 28 min  Past Medical History:  Past Medical History:  Diagnosis Date  . Diabetes ([place])    SLOW TO HEAL  . Gout   . HBP (high blood pressure)   . Kidney disease   . Poor circulation    Past Surgical History:  Past Surgical History:  Procedure Laterality Date  . APPENDECTOMY    . CATARACT EXTRACTION Bilateral    HPI:  82 year old woman currently at short-term rehabilitation, presents with failure to thrive, confusion. Initial evaluation revealed acute kidney injury, severe dehydration, oral thrush, UTI; CXR on 04/09/16 indicated Mild opacity in the lateral left lung base may represent atelectasis  Assessment / Plan / Recommendation Clinical Impression   Pt without overt s/s of aspiration noted with small sips of thin liquids and puree consistency with encouragement as pt stated "I do not want to eat" and was agitated and confused refusing portions of the BSE; Recommend a conservative diet of Dysphagia 1/thin (with small sips and/or tsp) until mentation improves. ST will f/u for diet tolerance.    Aspiration Risk  Mild aspiration risk    Diet Recommendation   Dysphagia 1/thin (small sips)  Medication Administration: Crushed with puree    Other  Recommendations Oral Care Recommendations: Oral care QID   Follow up Recommendations Skilled Nursing facility      Frequency and Duration min 1 x/week  1 week       Prognosis Prognosis for Safe Diet Advancement: Good Barriers to Reach Goals: Cognitive deficits      Swallow Study   General Date of Onset: 04/09/16 HPI: 82 year old woman currently at short-term rehabilitation, presents with failure to thrive, confusion. Initial evaluation revealed acute kidney injury, severe  dehydration, oral thrush, UTI. Type of Study: Bedside Swallow Evaluation Diet Prior to this Study: NPO Temperature Spikes Noted: No Respiratory Status: Room air History of Recent Intubation: No Behavior/Cognition: Confused;Agitated Oral Cavity Assessment: Other (comment);Dry (Pt refused) Oral Care Completed by SLP: Other (Comment) (Pt refused) Oral Cavity - Dentition: Missing dentition;Poor condition Vision: Impaired for self-feeding Self-Feeding Abilities: Needs assist;Needs set up Patient Positioning: Upright in bed Baseline Vocal Quality: Low vocal intensity Volitional Cough: Cognitively unable to elicit Volitional Swallow: Unable to elicit    Oral/Motor/Sensory Function Overall Oral Motor/Sensory Function: Generalized oral weakness   Ice Chips Ice chips: Not tested   Thin Liquid Thin Liquid: Within functional limits Presentation: Spoon;Cup    Nectar Thick Nectar Thick Liquid: Not tested   Honey Thick Honey Thick Liquid: Not tested   Puree Puree: Within functional limits Presentation: Spoon   Solid      Solid: Not tested    Functional Assessment Tool Used: NOMS Functional Limitations: Swallowing Swallow Current Status [id number]): At least 1 percent but less than 20 percent impaired, limited or restricted Swallow Goal Status [id number]): At least 1 percent but less than 20 percent impaired, limited or restricted   Chance Karam,PAT , M.S., CCC-SLP 04/10/2016,1:11 PM

## 2016-04-10 NOTE — Progress Notes (Signed)
[facility] PROGRESS NOTE  Jill Allen [id number] DOB: 10-25-1915 DOA: 04/09/2016   PCP: [name], MD     Assessment/Plan: Principal Problem:   AKI (acute kidney injury) ([place]) Active Problems:   UTI (urinary tract infection)   Dehydration   CKD (chronic kidney disease), stage III   DM (diabetes mellitus), type 2 with renal complications (HCC)   Hypercalcemia   Acute lower UTI  82 year old woman currently at short-term rehabilitation, presents with failure to thrive, confusion. Initial evaluation revealed acute kidney injury, hypercalcemia, severe dehydration, oral thrush, UTI.  Assessment and plan 1. Acute kidney injury/chronic kidney disease stage IV, baseline creatinine around 1.4. Presented with a creatinine of 2.56., prerenal pattern, with marked hypercalcemia. Urine analysis reveals hematuria, will order renal ultrasound.  Continue IV fluids, slowly improving 2. Marked hypercalcemia, normal 02/25/2016. Suspect related to dehydration. continue  Aggressive IV fluids. Recheck BMP in the morning. 3. UTI. Empiric antibiotics. Continue Rocephin 4. Possible atrial fibrillation. EKG poor quality. Telemetry appears to be sinus rhythm. Follow clinically. Rate is normal. 5. Diabetes mellitus, random blood sugar 157. Anion gap normal. 6. Chronic kidney disease stage III-baseline 1.4-1.9 7. Venous stasis ulcers present on admission.    DVT prophylaxsis heparin  Code Status:  DO NOT RESUSCITATE   Family Communication: Discussed in detail with the patient, all imaging results, lab results explained to the patient   Disposition Plan:  1-2 days      Consultants:  None  Procedures:  None  Antibiotics: Anti-infectives    Start     Dose/Rate Route Frequency Ordered Stop   04/10/16 1400  cefTRIAXone (ROCEPHIN) 1 g in dextrose 5 % 50 mL IVPB     1 g 100 mL/hr over 30 Minutes Intravenous Every 24 hours 04/09/16 1900     04/09/16 1330  cefTRIAXone  (ROCEPHIN) 1 g in dextrose 5 % 50 mL IVPB     1 g 100 mL/hr over 30 Minutes Intravenous  Once 04/09/16 1323 04/09/16 1438         HPI/Subjective: Patient is nonverbal and confused  Objective: Vitals:   04/09/16 1857 04/09/16 2029 04/10/16 0602 04/10/16 1000  BP: (!) 159/78 (!) 174/72 (!) 172/105 (!) 148/52  Pulse: 60 62 99   Resp: 16 18 20    Temp: 97.9 F (36.6 C) 97.5 F (36.4 C) 98.5 F (36.9 C)   TempSrc: Axillary Oral Oral   SpO2: 94% 92% 92%   Weight: 76.7 kg (169 lb 1.5 oz)     Height: 5\' 3"  (1.6 m)       Intake/Output Summary (Last 24 hours) at 04/10/16 1229 Last data filed at 04/10/16 0603  Gross per 24 hour  Intake              800 ml  Output                3 ml  Net              797 ml    Exam:  Examination:  General exam: Appears calm and comfortable  Respiratory system: Clear to auscultation. Respiratory effort normal. Cardiovascular system: S1 & S2 heard, RRR. No JVD, murmurs, rubs, gallops or clicks. No pedal edema. Gastrointestinal system: Abdomen is nondistended, soft and nontender. No organomegaly or masses felt. Normal bowel sounds heard. Central nervous system: Confused. No focal neurological deficits. Extremities: Symmetric 5 x 5 power. Skin: No rashes, lesions or ulcers Psychiatry: Judgement and insight appear normal. Mood &  affect appropriate.     Data Reviewed: I have personally reviewed following labs and imaging studies  Micro Results Recent Results (from the past 240 hour(s))  MRSA PCR Screening     Status: None   Collection Time: 04/09/16 12:40 AM  Result Value Ref Range Status   MRSA by PCR NEGATIVE NEGATIVE Final    Comment:        The GeneXpert MRSA Assay (FDA approved for NASAL specimens only), is one component of a comprehensive MRSA colonization surveillance program. It is not intended to diagnose MRSA infection nor to guide or monitor treatment for MRSA infections.     Radiology Reports Dg Chest Port 1  View  Result Date: 04/09/2016 CLINICAL DATA:  Generalized weakness. EXAM: PORTABLE CHEST 1 VIEW COMPARISON:  February 21, 2016 FINDINGS: Mild opacity in the lateral left lung base is favored to represent atelectasis. No other interval changes or acute abnormalities. IMPRESSION: Mild opacity in the lateral left lung base may represent atelectasis. A PA and lateral chest x-ray could better evaluate. Electronically Signed   By: Dorise Bullion III M.D   On: 04/09/2016 11:19     CBC  Recent Labs Lab 04/09/16 1135 04/10/16 0451  WBC 9.5 10.2  HGB 13.3 13.1  HCT 40.5 39.8  PLT 292 278  MCV 87.9 87.5  MCH 28.9 28.8  MCHC 32.8 32.9  RDW 14.1 13.9  LYMPHSABS 2.0  --   MONOABS 0.7  --   EOSABS 0.1  --   BASOSABS 0.0  --     Chemistries   Recent Labs Lab 04/09/16 1135 04/10/16 0451  NA 142 145  K 3.6 2.8*  CL 102 108  CO2 30 27  GLUCOSE 157* 126*  BUN 66* 57*  CREATININE 2.56* 2.08*  CALCIUM 13.5* 12.4*  AST 25  --   ALT 10*  --   ALKPHOS 120  --   BILITOT 0.7  --    ------------------------------------------------------------------------------------------------------------------ estimated creatinine clearance is 14.8 mL/min (by C-G formula based on SCr of 2.08 mg/dL (H)). ------------------------------------------------------------------------------------------------------------------ No results for input(s): HGBA1C in the last 72 hours. ------------------------------------------------------------------------------------------------------------------ No results for input(s): CHOL, HDL, LDLCALC, TRIG, CHOLHDL, LDLDIRECT in the last 72 hours. ------------------------------------------------------------------------------------------------------------------  Recent Labs  04/10/16 1011  TSH 0.378   ------------------------------------------------------------------------------------------------------------------ No results for input(s): VITAMINB12, FOLATE, FERRITIN, TIBC,  IRON, RETICCTPCT in the last 72 hours.  Coagulation profile No results for input(s): INR, PROTIME in the last 168 hours.  No results for input(s): DDIMER in the last 72 hours.  Cardiac Enzymes No results for input(s): CKMB, TROPONINI, MYOGLOBIN in the last 168 hours.  Invalid input(s): CK ------------------------------------------------------------------------------------------------------------------ Invalid input(s): POCBNP   CBG:  Recent Labs Lab 04/09/16 2032 04/10/16 0824  GLUCAP 121* 113*       Studies: Dg Chest Port 1 View  Result Date: 04/09/2016 CLINICAL DATA:  Generalized weakness. EXAM: PORTABLE CHEST 1 VIEW COMPARISON:  February 21, 2016 FINDINGS: Mild opacity in the lateral left lung base is favored to represent atelectasis. No other interval changes or acute abnormalities. IMPRESSION: Mild opacity in the lateral left lung base may represent atelectasis. A PA and lateral chest x-ray could better evaluate. Electronically Signed   By: Dorise Bullion III M.D   On: 04/09/2016 11:19      Lab Results  Component Value Date   HGBA1C 7.4 (H) 02/07/2016   Lab Results  Component Value Date   CREATININE 2.08 (H) 04/10/2016       Scheduled Meds: . cefTRIAXone (ROCEPHIN)  IV  1 g Intravenous Q24H  . heparin  5,000 Units Subcutaneous Q8H  . insulin aspart  0-9 Units Subcutaneous TID WC  . magnesium oxide  400 mg Oral BID  . potassium chloride  40 mEq Oral TID   Continuous Infusions: . 0.9 % NaCl with KCl 40 mEq / L 100 mL/hr (04/10/16 1146)     LOS: 1 day    Time spent: >30 MINS    [facility] Pager [phone]. If 7PM-7AM, please contact night-coverage at [email or web address], password [facility] 04/10/2016, 12:29 PM  LOS: 1 day

## 2016-04-10 NOTE — Clinical Social Work Placement (Signed)
   CLINICAL SOCIAL WORK PLACEMENT  NOTE  Date:  04/10/2016  Patient Details  Name: Jill Allen MRN: [id number] Date of Birth: [birth year]  Clinical Social Work is seeking post-discharge placement for this patient at the [place] level of care (*CSW will initial, date and re-position this form in  chart as items are completed):  Yes   Patient/family provided with [facility] Work Department's list of facilities offering this level of care within the geographic area requested by the patient (or if unable, by the patient's family).  Yes   Patient/family informed of their freedom to choose among providers that offer the needed level of care, that participate in Medicare, Medicaid or managed care program needed by the patient, have an available bed and are willing to accept the patient.  Yes   Patient/family informed of [place]'s ownership interest in [facility] and [facility], as well as of the fact that they are under no obligation to receive care at these facilities.  PASRR submitted to EDS on 04/10/16     PASRR number received on 04/10/16     Existing PASRR number confirmed on       FL2 transmitted to all facilities in geographic area requested by pt/family on 04/10/16     FL2 transmitted to all facilities within larger geographic area on       Patient informed that his/her managed care company has contracts with or will negotiate with certain facilities, including the following:            Patient/family informed of bed offers received.  Patient chooses bed at       Physician recommends and patient chooses bed at      Patient to be transferred to   on  .  Patient to be transferred to facility by       Patient family notified on   of transfer.  Name of family member notified:        PHYSICIAN       Additional Comment:    _______________________________________________ Standley Brooking, LCSW 04/10/2016, 3:08 PM

## 2016-04-11 LAB — PARATHYROID HORMONE, INTACT (NO CA): PTH: 13 pg/mL — ABNORMAL LOW (ref 15–65)

## 2016-04-11 LAB — COMPREHENSIVE METABOLIC PANEL
ALK PHOS: 116 U/L (ref 38–126)
ALT: 9 U/L — AB (ref 14–54)
AST: 27 U/L (ref 15–41)
Albumin: 3.2 g/dL — ABNORMAL LOW (ref 3.5–5.0)
Anion gap: 8 (ref 5–15)
BILIRUBIN TOTAL: 0.6 mg/dL (ref 0.3–1.2)
BUN: 50 mg/dL — AB (ref 6–20)
CHLORIDE: 117 mmol/L — AB (ref 101–111)
CO2: 24 mmol/L (ref 22–32)
CREATININE: 2.14 mg/dL — AB (ref 0.44–1.00)
Calcium: 12.5 mg/dL — ABNORMAL HIGH (ref 8.9–10.3)
GFR calc Af Amer: 21 mL/min — ABNORMAL LOW (ref >60)
GFR, EST NON AFRICAN AMERICAN: 18 mL/min — AB (ref >60)
Glucose, Bld: 128 mg/dL — ABNORMAL HIGH (ref 65–99)
Potassium: 5.3 mmol/L — ABNORMAL HIGH (ref 3.5–5.1)
Sodium: 149 mmol/L — ABNORMAL HIGH (ref 135–145)
Total Protein: 7.7 g/dL (ref 6.5–8.1)

## 2016-04-11 LAB — GLUCOSE, CAPILLARY
GLUCOSE-CAPILLARY: 113 mg/dL — AB (ref 65–99)
GLUCOSE-CAPILLARY: 121 mg/dL — AB (ref 65–99)
GLUCOSE-CAPILLARY: 137 mg/dL — AB (ref 65–99)
Glucose-Capillary: 136 mg/dL — ABNORMAL HIGH (ref 65–99)

## 2016-04-11 MED ORDER — SODIUM CHLORIDE 0.45 % IV SOLN
INTRAVENOUS | Status: DC
  Administered 2016-04-11 – 2016-04-14 (×7): via INTRAVENOUS

## 2016-04-11 MED ORDER — SODIUM CHLORIDE 0.9 % IV SOLN: INTRAVENOUS | Status: DC

## 2016-04-11 MED ORDER — ENSURE ENLIVE PO LIQD
237.0000 mL | Freq: Three times a day (TID) | ORAL | Status: DC
  Administered 2016-04-11 – 2016-04-16 (×6): 237 mL via ORAL

## 2016-04-11 MED ORDER — CALCITONIN (SALMON) 200 UNIT/ACT NA SOLN
1.0000 | Freq: Every day | NASAL | Status: DC
  Administered 2016-04-11 – 2016-04-12 (×2): 1 via NASAL
  Filled 2016-04-11: qty 3.7

## 2016-04-11 NOTE — Progress Notes (Signed)
Initial Nutrition Assessment  DOCUMENTATION CODES:   Obesity unspecified  INTERVENTION:   Ensure Enlive po TID, each supplement provides 350 kcal and 20 grams of protein  NUTRITION DIAGNOSIS:   Inadequate oral intake related to poor appetite, dysphagia as evidenced by mild depletion of muscle mass, meal completion < 50%, percent weight loss   GOAL:   Patient will meet greater than or equal to 90% of their needs  MONITOR:   PO intake  REASON FOR ASSESSMENT:   Malnutrition Screening Tool    ASSESSMENT:   82 year old woman currently at short-term rehabilitation, presents with failure to thrive, confusion. Initial evaluation revealed acute kidney injury, severe dehydration, oral thrush, UTI; CXR on 04/09/16 indicated Mild opacity in the lateral left lung base may represent atelectasis. h/o CKD III, DM,   Met with pt in room today. Pt unable to communicate. Per chart, pt eating 50% meals. SLP evaluation on 12/28. Pt on DYS 1/thins. Per chart, pt has lost 32lbs(16%) in 2 months. This is severe. Will order supplements.   Medications reviewed and include: calcitonin, ceftriaxone, heparin, insulin, Mg oxide  Labs reviewed: Na 149(H), K 5.3(H), Cl 117(H), BUN 50(H), creat 2.14(H), Ca 12.5(H) adj. 13.14(H), Alb 3.2(L) CBGs- 157, 126, 128 x 24 hrs AIC 7.4(H)-10/26  Nutrition-Focused physical exam completed. Findings are no fat depletion, moderate muscle depletion, and no edema.   Diet Order:  DIET - DYS 1 Room service appropriate? Yes; Fluid consistency: Thin  Skin:  Wound (see comment) (venous stasis ulcer Left leg)  Last BM:  none since admit  Height:   Ht Readings from Last 1 Encounters:  04/09/16 5' 3"  (1.6 m)    Weight:   Wt Readings from Last 1 Encounters:  04/09/16 169 lb 1.5 oz (76.7 kg)    Ideal Body Weight:  52.2 kg  BMI:  Body mass index is 29.95 kg/m.  Estimated Nutritional Needs:   Kcal:  1300-1600kcal/day   Protein:  84-99g/day   Fluid:   >1.3L/day   EDUCATION NEEDS:   No education needs identified at this time  Koleen Distance, RD, LDN Pager #[phone]

## 2016-04-11 NOTE — Consult Note (Signed)
[place] Nurse wound consult note Reason for Consult: Patient known to our service from a previous admission.  Seen today for assessment of left posterior LE and for MASD, specifically incontinence associated dermatitis (IAD) Wound type: Trauma, moisture (dual incontinence) Pressure Ulcer POA: No Measurement: Posterior left LE with linear full thickness wound measuring 4.8cm x 0.5cm x 0.2cm with red, moist center and scant serosanguinous exudate on old dressing. Buttocks with recently reepithelialized areas of partial thickness tissue loss from incontinence associated dermatitis.  No open wounds today. Wound bed:As described above Drainage (amount, consistency, odor) As described above Periwound:Intact, clear Dressing procedure/placement/frequency:Patient was placed on a therapeutic sleep surface with low air loss feature yesterday and is being turned and repositioned per house protocols.  She is wearing bilateral pressure redistribution boots to the feet.  She has dual incontinence and is soiled at the time of my assessment with both urine and soft light brown stool.  There is no wound present in the perianal, buttock or sacral areas.  The posterior left LE has what appears to be a traumatic wound and no other ulcerations.  I would not recommend continuation of LE wrapping (e.g., Unnas' Boots) while in house.  Topical wound care to the full thickness wound will be conservative and moisture retentive, I have implemented a once daily white petrolatum gauze dressing. [place] nursing team will not follow, but will remain available to this patient, the nursing and medical teams.  Please re-consult if needed. Thanks, Maudie Flakes, MSN, RN, [place], Arther Abbott  Pager# [phone]

## 2016-04-11 NOTE — Progress Notes (Signed)
[facility] PROGRESS NOTE  Jill Allen [id number] DOB: 16-Jun-1915 DOA: 04/09/2016   PCP: [name], MD     Assessment/Plan: Principal Problem:   AKI (acute kidney injury) ([place]) Active Problems:   UTI (urinary tract infection)   Dehydration   CKD (chronic kidney disease), stage III   DM (diabetes mellitus), type 2 with renal complications (HCC)   Hypercalcemia   Acute lower UTI  82 year old woman currently at short-term rehabilitation, presents with failure to thrive, confusion. Initial evaluation revealed acute kidney injury, hypercalcemia, severe dehydration, oral thrush, UTI.  Assessment and plan 1. Acute kidney injury/chronic kidney disease stage IV, baseline creatinine around 1.4. Presented with a creatinine of 2.56., prerenal pattern, with marked hypercalcemia. Creatinine now 2.14. Urine analysis reveals hematuria, renal ultrasound negative for obstructive uropathy,  Continue IV fluids, slowly improving change to half normal saline due to sodium of 149 2. Moderate  hypercalcemia, normal 02/25/2016. Suspect related to dehydration. continue  Aggressive IV fluids. Corrected calcium is around 13. Continue IV fluids. TSH normal. PTH low. PTH related protein pending 3. UTI. Empiric antibiotics. Continue Rocephin. Urine culture shows gram-negative rods 4. Possible atrial fibrillation. EKG poor quality. Telemetry appears to be sinus rhythm. Follow clinically. Rate is normal. 5. Diabetes mellitus, random blood sugar 157. Anion gap normal. 6. Chronic kidney disease stage III-baseline 1.4-1.9 7. Venous stasis ulcers present on admission. Wound care consultation ordered and requested 8. Hypokalemia-repleted   DVT prophylaxsis heparin  Code Status:  DO NOT RESUSCITATE   Family Communication: Discussed in detail with the patient, all imaging results, lab results explained to the patient   Disposition Plan:  Anticipate discharge once electrolyte abnormalities  and renal function improves      Consultants:  None  Procedures:  None  Antibiotics: Anti-infectives    Start     Dose/Rate Route Frequency Ordered Stop   04/10/16 1400  cefTRIAXone (ROCEPHIN) 1 g in dextrose 5 % 50 mL IVPB     1 g 100 mL/hr over 30 Minutes Intravenous Every 24 hours 04/09/16 1900     04/09/16 1330  cefTRIAXone (ROCEPHIN) 1 g in dextrose 5 % 50 mL IVPB     1 g 100 mL/hr over 30 Minutes Intravenous  Once 04/09/16 1323 04/09/16 1438         HPI/Subjective: Patient is nonverbal and confused  Objective: Vitals:   04/10/16 1346 04/10/16 2137 04/11/16 0228 04/11/16 0436  BP: (!) 153/87 (!) 178/89 (!) 163/71 (!) 153/75  Pulse: 99 87 86 95  Resp: 14 16 16 16   Temp: 97.7 F (36.5 C) 98.5 F (36.9 C) 98.7 F (37.1 C) 99.5 F (37.5 C)  TempSrc: Oral Oral Oral Oral  SpO2: 100% 99% 99% 100%  Weight:      Height:        Intake/Output Summary (Last 24 hours) at 04/11/16 0850 Last data filed at 04/11/16 0600  Gross per 24 hour  Intake          2473.33 ml  Output                0 ml  Net          2473.33 ml    Exam:  Examination:  General exam: Appears calm and comfortable  Respiratory system: Clear to auscultation. Respiratory effort normal. Cardiovascular system: S1 & S2 heard, RRR. No JVD, murmurs, rubs, gallops or clicks. No pedal edema. Gastrointestinal system: Abdomen is nondistended, soft and nontender. No organomegaly or masses  felt. Normal bowel sounds heard. Central nervous system: Confused. No focal neurological deficits. Extremities: Symmetric 5 x 5 power. Skin: No rashes, lesions or ulcers Psychiatry: Judgement and insight appear normal. Mood & affect appropriate.     Data Reviewed: I have personally reviewed following labs and imaging studies  Micro Results Recent Results (from the past 240 hour(s))  MRSA PCR Screening     Status: None   Collection Time: 04/09/16 12:40 AM  Result Value Ref Range Status   MRSA by PCR NEGATIVE  NEGATIVE Final    Comment:        The GeneXpert MRSA Assay (FDA approved for NASAL specimens only), is one component of a comprehensive MRSA colonization surveillance program. It is not intended to diagnose MRSA infection nor to guide or monitor treatment for MRSA infections.   Urine culture     Status: Abnormal (Preliminary result)   Collection Time: 04/09/16 12:30 PM  Result Value Ref Range Status   Specimen Description URINE, RANDOM  Final   Special Requests NONE  Final   Culture >=100,000 COLONIES/mL GRAM NEGATIVE RODS (A)  Final   Report Status PENDING  Incomplete    Radiology Reports US Renal  Result Date: 04/11/2016 CLINICAL DATA:  Acute kidney injury.  Hypercalcemia. EXAM: RENAL / URINARY TRACT ULTRASOUND COMPLETE COMPARISON:  Renal ultrasound 02/07/2016 FINDINGS: Right Kidney: Length: 7.8 cm. There is renal cortical atrophy and thinning of renal parenchyma, increased renal echogenicity. Pelvic fullness appear similar to prior exam. No frank hydronephrosis. Left Kidney: Length: 7.7 cm. There is renal cortical atrophy and thinning of renal parenchyma, increased renal echogenicity. No hydronephrosis. Bladder: Appears normal for degree of bladder distention. Layering debris on prior exam is no longer present. IMPRESSION: 1. No hydronephrosis or obstructive uropathy. 2. Renal cortical thinning with increased renal echogenicity consistent with chronic medical renal disease. Electronically Signed   By: Jeb Levering M.D.   On: 04/11/2016 00:20   Dg Chest Port 1 View  Result Date: 04/09/2016 CLINICAL DATA:  Generalized weakness. EXAM: PORTABLE CHEST 1 VIEW COMPARISON:  February 21, 2016 FINDINGS: Mild opacity in the lateral left lung base is favored to represent atelectasis. No other interval changes or acute abnormalities. IMPRESSION: Mild opacity in the lateral left lung base may represent atelectasis. A PA and lateral chest x-ray could better evaluate. Electronically Signed   By:  Dorise Bullion III M.D   On: 04/09/2016 11:19     CBC  Recent Labs Lab 04/09/16 1135 04/10/16 0451  WBC 9.5 10.2  HGB 13.3 13.1  HCT 40.5 39.8  PLT 292 278  MCV 87.9 87.5  MCH 28.9 28.8  MCHC 32.8 32.9  RDW 14.1 13.9  LYMPHSABS 2.0  --   MONOABS 0.7  --   EOSABS 0.1  --   BASOSABS 0.0  --     Chemistries   Recent Labs Lab 04/09/16 1135 04/10/16 0451 04/11/16 0417  NA 142 145 149*  K 3.6 2.8* 5.3*  CL 102 108 117*  CO2 30 27 24   GLUCOSE 157* 126* 128*  BUN 66* 57* 50*  CREATININE 2.56* 2.08* 2.14*  CALCIUM 13.5* 12.4* 12.5*  AST 25  --  27  ALT 10*  --  9*  ALKPHOS 120  --  116  BILITOT 0.7  --  0.6   ------------------------------------------------------------------------------------------------------------------ estimated creatinine clearance is 14.4 mL/min (by C-G formula based on SCr of 2.14 mg/dL (H)). ------------------------------------------------------------------------------------------------------------------ No results for input(s): HGBA1C in the last 72 hours. ------------------------------------------------------------------------------------------------------------------ No results for  input(s): CHOL, HDL, LDLCALC, TRIG, CHOLHDL, LDLDIRECT in the last 72 hours. ------------------------------------------------------------------------------------------------------------------  Recent Labs  04/10/16 1011  TSH 0.378   ------------------------------------------------------------------------------------------------------------------ No results for input(s): VITAMINB12, FOLATE, FERRITIN, TIBC, IRON, RETICCTPCT in the last 72 hours.  Coagulation profile No results for input(s): INR, PROTIME in the last 168 hours.  No results for input(s): DDIMER in the last 72 hours.  Cardiac Enzymes No results for input(s): CKMB, TROPONINI, MYOGLOBIN in the last 168 hours.  Invalid input(s):  CK ------------------------------------------------------------------------------------------------------------------ Invalid input(s): POCBNP   CBG:  Recent Labs Lab 04/10/16 0824 04/10/16 1307 04/10/16 1708 04/10/16 2252 04/11/16 0725  GLUCAP 113* 121* 156* 135* 113*       Studies: US Renal  Result Date: 04/11/2016 CLINICAL DATA:  Acute kidney injury.  Hypercalcemia. EXAM: RENAL / URINARY TRACT ULTRASOUND COMPLETE COMPARISON:  Renal ultrasound 02/07/2016 FINDINGS: Right Kidney: Length: 7.8 cm. There is renal cortical atrophy and thinning of renal parenchyma, increased renal echogenicity. Pelvic fullness appear similar to prior exam. No frank hydronephrosis. Left Kidney: Length: 7.7 cm. There is renal cortical atrophy and thinning of renal parenchyma, increased renal echogenicity. No hydronephrosis. Bladder: Appears normal for degree of bladder distention. Layering debris on prior exam is no longer present. IMPRESSION: 1. No hydronephrosis or obstructive uropathy. 2. Renal cortical thinning with increased renal echogenicity consistent with chronic medical renal disease. Electronically Signed   By: Jeb Levering M.D.   On: 04/11/2016 00:20   Dg Chest Port 1 View  Result Date: 04/09/2016 CLINICAL DATA:  Generalized weakness. EXAM: PORTABLE CHEST 1 VIEW COMPARISON:  February 21, 2016 FINDINGS: Mild opacity in the lateral left lung base is favored to represent atelectasis. No other interval changes or acute abnormalities. IMPRESSION: Mild opacity in the lateral left lung base may represent atelectasis. A PA and lateral chest x-ray could better evaluate. Electronically Signed   By: Dorise Bullion III M.D   On: 04/09/2016 11:19      Lab Results  Component Value Date   HGBA1C 7.4 (H) 02/07/2016   Lab Results  Component Value Date   CREATININE 2.14 (H) 04/11/2016       Scheduled Meds: . calcitonin (salmon)  1 spray Alternating Nares Daily  . cefTRIAXone (ROCEPHIN)  IV  1 g  Intravenous Q24H  . heparin  5,000 Units Subcutaneous Q8H  . insulin aspart  0-9 Units Subcutaneous TID WC  . magnesium oxide  400 mg Oral BID   Continuous Infusions: . sodium chloride       LOS: 2 days    Time spent: >30 MINS    [facility]  [facility] Pager [phone]. If 7PM-7AM, please contact night-coverage at [email or web address], password [facility] 04/11/2016, 8:50 AM  LOS: 2 days

## 2016-04-11 NOTE — Clinical Social Work Placement (Signed)
Patient has a bed at [facility]. CSW has completed FL2 & will continue to follow and assist with discharge when ready.    Raynaldo Opitz, [facility] Clinical Social Worker cell #: [phone]    CLINICAL SOCIAL WORK PLACEMENT  NOTE  Date:  04/11/2016  Patient Details  Name: Jill Allen MRN: [id number] Date of Birth: Jul 23, 1915  Clinical Social Work is seeking post-discharge placement for this patient at the [place] level of care (*CSW will initial, date and re-position this form in  chart as items are completed):  Yes   Patient/family provided with [facility] Department's list of facilities offering this level of care within the geographic area requested by the patient (or if unable, by the patient's family).  Yes   Patient/family informed of their freedom to choose among providers that offer the needed level of care, that participate in Medicare, Medicaid or managed care program needed by the patient, have an available bed and are willing to accept the patient.  Yes   Patient/family informed of [facility]'s ownership interest in [facility] and [facility], as well as of the fact that they are under no obligation to receive care at these facilities.  PASRR submitted to EDS on 04/10/16     PASRR number received on 04/10/16     Existing PASRR number confirmed on       FL2 transmitted to all facilities in geographic area requested by pt/family on 04/10/16     FL2 transmitted to all facilities within larger geographic area on       Patient informed that his/her managed care company has contracts with or will negotiate with certain facilities, including the following:        Yes   Patient/family informed of bed offers received.  Patient chooses bed at [facility]     Physician recommends and patient chooses bed at      Patient to be transferred to [place] on  .  Patient to be transferred to  facility by       Patient family notified on   of transfer.  Name of family member notified:        PHYSICIAN       Additional Comment:    _______________________________________________ Standley Brooking, LCSW 04/11/2016, 10:20 AM

## 2016-04-12 DIAGNOSIS — G9340 Encephalopathy, unspecified: Secondary | ICD-10-CM

## 2016-04-12 LAB — BASIC METABOLIC PANEL
ANION GAP: 6 (ref 5–15)
BUN: 47 mg/dL — ABNORMAL HIGH (ref 6–20)
CALCIUM: 12.9 mg/dL — AB (ref 8.9–10.3)
CO2: 24 mmol/L (ref 22–32)
Chloride: 116 mmol/L — ABNORMAL HIGH (ref 101–111)
Creatinine, Ser: 2.28 mg/dL — ABNORMAL HIGH (ref 0.44–1.00)
GFR, EST AFRICAN AMERICAN: 19 mL/min — AB (ref >60)
GFR, EST NON AFRICAN AMERICAN: 17 mL/min — AB (ref >60)
GLUCOSE: 114 mg/dL — AB (ref 65–99)
POTASSIUM: 4.8 mmol/L (ref 3.5–5.1)
SODIUM: 146 mmol/L — AB (ref 135–145)

## 2016-04-12 LAB — GLUCOSE, CAPILLARY
Glucose-Capillary: 107 mg/dL — ABNORMAL HIGH (ref 65–99)
Glucose-Capillary: 116 mg/dL — ABNORMAL HIGH (ref 65–99)
Glucose-Capillary: 115 mg/dL — ABNORMAL HIGH (ref 65–99)
Glucose-Capillary: 87 mg/dL (ref 65–99)

## 2016-04-12 LAB — CBC
HCT: 38.7 % (ref 36.0–46.0)
Hemoglobin: 12.2 g/dL (ref 12.0–15.0)
MCH: 28.6 pg (ref 26.0–34.0)
MCHC: 31.5 g/dL (ref 30.0–36.0)
MCV: 90.6 fL (ref 78.0–100.0)
PLATELETS: 266 10*3/uL (ref 150–400)
RBC: 4.27 MIL/uL (ref 3.87–5.11)
RDW: 15.2 % (ref 11.5–15.5)
WBC: 8.5 10*3/uL (ref 4.0–10.5)

## 2016-04-12 MED ORDER — VITAMINS A & D EX OINT
TOPICAL_OINTMENT | CUTANEOUS | Status: AC
  Administered 2016-04-12: 20:00:00
  Filled 2016-04-12: qty 5

## 2016-04-12 MED ORDER — SODIUM CHLORIDE 0.9 % IV SOLN
30.0000 mg | Freq: Once | INTRAVENOUS | Status: AC
  Administered 2016-04-12: 30 mg via INTRAVENOUS
  Filled 2016-04-12: qty 10
  Filled 2016-04-12: qty 3.33

## 2016-04-12 NOTE — Clinical Social Work Note (Signed)
SW spoke with Clarene Critchley- Admissions at [facility] who had questions re: tentative d/c of patient.  Per SW handoff- d/c is anticipated for Monday 04/14/16.  Pt's nephew- Margette Fast was asking also for clarification on possible d/c and was notified of same.  Currently no MD note in place for today to confirm above.  SW services will monitor.  Lorie Phenix. Pauline Good, [place]  (weekend coverage)

## 2016-04-12 NOTE — Progress Notes (Signed)
PROGRESS NOTE  Jill Allen [id number] DOB: 08-22-1915 DOA: 04/09/2016 PCP: [name], MD  HPI/Recap of past 54 hours: 82 year old woman with past medical history of diabetes mellitus and stage III chronic kidney disease admitted on 12/27 for acute encephalopathy, UTI, acute kidney injury and hypercalcemia. Patient started on IV fluids and IV Rocephin. Confusion no better and today, creatinine worse with calcium trending upwards.  Assessment/Plan: Principal Problem:   AKI (acute kidney injury) ([place]) in the setting of CKD (chronic kidney disease), stage III: In part from UTI, but also may be from hypercalcemia.Try pamidronate Hypernatremia: From hypercalcemia.  Cont 1/2 normal saline.  Worse today. Active Problems:   UTI (urinary tract infection): E.coli, sensitivities pending.  Cont rocephin   Dehydration Acute encephalopathy: Secondary to hypercalcemia.     DM (diabetes mellitus), type 2 with renal complications [facility]):  Dysphagia:with worsening confusion, will restrict diet  Hypercalcemia: Cause?  Parathyroid hormone normal. Given overall worsening, will try pamidronate.   Code Status: DO NOT RESUSCITATE   Family Communication: Left message with family   Disposition Plan: Hopefully patient will improve, and if so then to skilled nursing    Consultants:  None   Procedures:  None   Antimicrobials:  IV Rocephin 12/28-present  DVT prophylaxis: SCD's   Objective: Vitals:   04/11/16 1852 04/11/16 2121 04/12/16 0346 04/12/16 1245  BP: (!) 160/70 (!) 166/81 (!) 159/83 (!) 151/79  Pulse:  (!) 103 97 91  Resp:  (!) 22 20 20   Temp:  98.1 F (36.7 C) 98.3 F (36.8 C) 98.5 F (36.9 C)  TempSrc:  Oral Oral Oral  SpO2:  100% 100% 99%  Weight:      Height:        Intake/Output Summary (Last 24 hours) at 04/12/16 1509 Last data filed at 04/12/16 [id number]  Gross per 24 hour  Intake          2033.74 ml  Output                0 ml  Net          2033.74 ml    Filed Weights   04/09/16 1032 04/09/16 1857  Weight: 85.7 kg (189 lb) 76.7 kg (169 lb 1.5 oz)    Exam:   General:  Confused, mild distress secondary to thirst, oriented 1, moans  Cardiovascular: Regular rate and rhythm, S1-S2   Respiratory: Clear to auscultation bilaterally   Abdomen: Soft, nontender, nondistended, positive bowel sounds   Musculoskeletal: Trace pitting edema   Skin: No skin breaks tears or lesions  Psychiatry: Patient acutely confused    Data Reviewed: CBC:  Recent Labs Lab 04/09/16 1135 04/10/16 0451 04/12/16 0406  WBC 9.5 10.2 8.5  NEUTROABS 6.7  --   --   HGB 13.3 13.1 12.2  HCT 40.5 39.8 38.7  MCV 87.9 87.5 90.6  PLT 292 278 [id number]   Basic Metabolic Panel:  Recent Labs Lab 04/09/16 1135 04/10/16 0451 04/11/16 0417 04/12/16 0406  NA 142 145 149* 146*  K 3.6 2.8* 5.3* 4.8  CL 102 108 117* 116*  CO2 30 27 24 24   GLUCOSE 157* 126* 128* 114*  BUN 66* 57* 50* 47*  CREATININE 2.56* 2.08* 2.14* 2.28*  CALCIUM 13.5* 12.4* 12.5* 12.9*   GFR: Estimated Creatinine Clearance: 13.5 mL/min (by C-G formula based on SCr of 2.28 mg/dL (H)). Liver Function Tests:  Recent Labs Lab 04/09/16 1135 04/11/16 0417  AST 25 27  ALT 10* 9*  ALKPHOS 120 116  BILITOT 0.7 0.6  PROT 8.2* 7.7  ALBUMIN 3.4* 3.2*   No results for input(s): LIPASE, AMYLASE in the last 168 hours. No results for input(s): AMMONIA in the last 168 hours. Coagulation Profile: No results for input(s): INR, PROTIME in the last 168 hours. Cardiac Enzymes: No results for input(s): CKTOTAL, CKMB, CKMBINDEX, TROPONINI in the last 168 hours. BNP (last 3 results) No results for input(s): PROBNP in the last 8760 hours. HbA1C: No results for input(s): HGBA1C in the last 72 hours. CBG:  Recent Labs Lab 04/11/16 1223 04/11/16 1826 04/11/16 2147 04/12/16 0752 04/12/16 1155  GLUCAP 136* 121* 137* 107* 116*   Lipid Profile: No results for input(s): CHOL, HDL, LDLCALC, TRIG,  CHOLHDL, LDLDIRECT in the last 72 hours. Thyroid Function Tests:  Recent Labs  04/10/16 1011  TSH 0.378   Anemia Panel: No results for input(s): VITAMINB12, FOLATE, FERRITIN, TIBC, IRON, RETICCTPCT in the last 72 hours. Urine analysis:    Component Value Date/Time   COLORURINE YELLOW 04/09/2016 1230   APPEARANCEUR TURBID (A) 04/09/2016 1230   LABSPEC 1.014 04/09/2016 1230   PHURINE 6.0 04/09/2016 1230   GLUCOSEU NEGATIVE 04/09/2016 1230   HGBUR MODERATE (A) 04/09/2016 1230   BILIRUBINUR NEGATIVE 04/09/2016 1230   KETONESUR NEGATIVE 04/09/2016 1230   PROTEINUR 100 (A) 04/09/2016 1230   NITRITE NEGATIVE 04/09/2016 1230   LEUKOCYTESUR LARGE (A) 04/09/2016 1230   Sepsis Labs: @LABRCNTIP (procalcitonin:4,lacticidven:4)  ) Recent Results (from the past 240 hour(s))  MRSA PCR Screening     Status: None   Collection Time: 04/09/16 12:40 AM  Result Value Ref Range Status   MRSA by PCR NEGATIVE NEGATIVE Final    Comment:        The GeneXpert MRSA Assay (FDA approved for NASAL specimens only), is one component of a comprehensive MRSA colonization surveillance program. It is not intended to diagnose MRSA infection nor to guide or monitor treatment for MRSA infections.   Urine culture     Status: Abnormal (Preliminary result)   Collection Time: 04/09/16 12:30 PM  Result Value Ref Range Status   Specimen Description URINE, RANDOM  Final   Special Requests NONE  Final   Culture (A)  Final    >=100,000 COLONIES/mL ESCHERICHIA COLI SUSCEPTIBILITIES TO FOLLOW Performed at [facility]    Report Status PENDING  Incomplete      Studies: No results found.  Scheduled Meds: . calcitonin (salmon)  1 spray Alternating Nares Daily  . cefTRIAXone (ROCEPHIN)  IV  1 g Intravenous Q24H  . feeding supplement (ENSURE ENLIVE)  237 mL Oral TID BM  . heparin  5,000 Units Subcutaneous Q8H  . insulin aspart  0-9 Units Subcutaneous TID WC  . magnesium oxide  400 mg Oral BID     Continuous Infusions: . sodium chloride 125 mL/hr at 04/11/16 1847     LOS: 3 days     Annita Brod, MD [facility] Pager [phone]  If 7PM-7AM, please contact night-coverage [email or web address] Password [email or web address] 04/12/2016, 3:09 PM

## 2016-04-13 LAB — URINE CULTURE

## 2016-04-13 LAB — BASIC METABOLIC PANEL
Anion gap: 10 (ref 5–15)
BUN: 38 mg/dL — AB (ref 6–20)
CO2: 23 mmol/L (ref 22–32)
Calcium: 12.2 mg/dL — ABNORMAL HIGH (ref 8.9–10.3)
Chloride: 109 mmol/L (ref 101–111)
Creatinine, Ser: 1.95 mg/dL — ABNORMAL HIGH (ref 0.44–1.00)
GFR calc Af Amer: 23 mL/min — ABNORMAL LOW (ref >60)
GFR, EST NON AFRICAN AMERICAN: 20 mL/min — AB (ref >60)
GLUCOSE: 99 mg/dL (ref 65–99)
POTASSIUM: 3.8 mmol/L (ref 3.5–5.1)
Sodium: 142 mmol/L (ref 135–145)

## 2016-04-13 LAB — GLUCOSE, CAPILLARY
GLUCOSE-CAPILLARY: 102 mg/dL — AB (ref 65–99)
Glucose-Capillary: 84 mg/dL (ref 65–99)
Glucose-Capillary: 85 mg/dL (ref 65–99)
Glucose-Capillary: 92 mg/dL (ref 65–99)

## 2016-04-13 MED ORDER — METOPROLOL SUCCINATE ER 100 MG PO TB24
100.0000 mg | ORAL_TABLET | Freq: Every day | ORAL | Status: DC
  Filled 2016-04-13: qty 1

## 2016-04-13 MED ORDER — CEFAZOLIN IN D5W 1 GM/50ML IV SOLN
1.0000 g | Freq: Two times a day (BID) | INTRAVENOUS | Status: DC
  Administered 2016-04-13 – 2016-04-15 (×5): 1 g via INTRAVENOUS
  Filled 2016-04-13 (×5): qty 50

## 2016-04-13 MED ORDER — METOPROLOL TARTRATE 5 MG/5ML IV SOLN
5.0000 mg | Freq: Four times a day (QID) | INTRAVENOUS | Status: DC
  Administered 2016-04-13 – 2016-04-16 (×12): 5 mg via INTRAVENOUS
  Filled 2016-04-13 (×12): qty 5

## 2016-04-13 MED ORDER — AMLODIPINE BESYLATE 10 MG PO TABS
10.0000 mg | ORAL_TABLET | Freq: Every day | ORAL | Status: DC
  Administered 2016-04-13 – 2016-04-16 (×4): 10 mg via ORAL
  Filled 2016-04-13 (×4): qty 1

## 2016-04-13 MED ORDER — CEFAZOLIN IN D5W 1 GM/50ML IV SOLN: 1.0000 g | Freq: Three times a day (TID) | INTRAVENOUS | Status: DC

## 2016-04-13 NOTE — Progress Notes (Signed)
Pt refusing to eat breakfast or lunch.  Will continue to offer food and drink.

## 2016-04-13 NOTE — Progress Notes (Addendum)
PROGRESS NOTE  Jill Allen [id number] DOB: Aug 09, 1915 DOA: 04/09/2016 PCP: [name], MD  HPI/Recap of past 51 hours: 82 year old woman with past medical history of diabetes mellitus and stage III chronic kidney disease admitted on 12/27 for acute encephalopathy, UTI, acute kidney injury and hypercalcemia. Patient started on IV fluids and IV Rocephin. Given a dose of pamidronate on 12/30.  Today, creatinine and calcium slightly improved. Patient herself appears to be slightly more awake, still confused  Assessment/Plan: Principal Problem:   AKI (acute kidney injury) ([place]) in the setting of CKD (chronic kidney disease), stage III: In part from UTI, but also may be from hypercalcemia. Slight improvement today Hypernatremia: From hypercalcemia.  Cont 1/2 normal saline.  Improved today Active Problems:   UTI (urinary tract infection): E.coli, sensitivities note resistance to Cipro. D escalate Rocephin to Ancef   Dehydration Acute encephalopathy: Secondary to hypercalcemia.     DM (diabetes mellitus), type 2 with renal complications [facility]):  Dysphagia:with worsening confusion, will restrict diet  Hypercalcemia: Cause?  Parathyroid hormone normal. Seems to have responded somewhat to pamidronate   Code Status: DO NOT RESUSCITATE   Family Communication: Left message with family   Disposition Plan: Hopefully patient will improve, and if so then to skilled nursing    Consultants:  None   Procedures:  None   Antimicrobials:  IV Rocephin 12/28-12/31  IV Ancef 12/31-present  DVT prophylaxis: SCD's   Objective: Vitals:   04/12/16 1245 04/12/16 2314 04/13/16 0418 04/13/16 0905  BP: (!) 151/79 (!) 162/88 (!) 176/90 (!) 148/77  Pulse: 91 96 94   Resp: 20 18    Temp: 98.5 F (36.9 C) 99.2 F (37.3 C) 99.2 F (37.3 C)   TempSrc: Oral Oral Oral   SpO2: 99% 99% 100%   Weight:      Height:        Intake/Output Summary (Last 24 hours) at 04/13/16 1336 Last data  filed at 04/13/16 0600  Gross per 24 hour  Intake          3083.33 ml  Output                0 ml  Net          3083.33 ml   Filed Weights   04/09/16 1032 04/09/16 1857  Weight: 85.7 kg (189 lb) 76.7 kg (169 lb 1.5 oz)    Exam:   General:  Confused, oriented 1. Slightly more alert than previous day  Cardiovascular: Regular rate and rhythm, S1-S2   Respiratory: Clear to auscultation bilaterally   Abdomen: Soft, nontender, nondistended, positive bowel sounds   Musculoskeletal: Trace pitting edema   Skin: No skin breaks tears or lesions  Psychiatry: Patient acutely confused    Data Reviewed: CBC:  Recent Labs Lab 04/09/16 1135 04/10/16 0451 04/12/16 0406  WBC 9.5 10.2 8.5  NEUTROABS 6.7  --   --   HGB 13.3 13.1 12.2  HCT 40.5 39.8 38.7  MCV 87.9 87.5 90.6  PLT 292 278 [id number]   Basic Metabolic Panel:  Recent Labs Lab 04/09/16 1135 04/10/16 0451 04/11/16 0417 04/12/16 0406 04/13/16 0532  NA 142 145 149* 146* 142  K 3.6 2.8* 5.3* 4.8 3.8  CL 102 108 117* 116* 109  CO2 30 27 24 24 23   GLUCOSE 157* 126* 128* 114* 99  BUN 66* 57* 50* 47* 38*  CREATININE 2.56* 2.08* 2.14* 2.28* 1.95*  CALCIUM 13.5* 12.4* 12.5* 12.9* 12.2*   GFR: Estimated Creatinine Clearance: 15.8 mL/min (  by C-G formula based on SCr of 1.95 mg/dL (H)). Liver Function Tests:  Recent Labs Lab 04/09/16 1135 04/11/16 0417  AST 25 27  ALT 10* 9*  ALKPHOS 120 116  BILITOT 0.7 0.6  PROT 8.2* 7.7  ALBUMIN 3.4* 3.2*   No results for input(s): LIPASE, AMYLASE in the last 168 hours. No results for input(s): AMMONIA in the last 168 hours. Coagulation Profile: No results for input(s): INR, PROTIME in the last 168 hours. Cardiac Enzymes: No results for input(s): CKTOTAL, CKMB, CKMBINDEX, TROPONINI in the last 168 hours. BNP (last 3 results) No results for input(s): PROBNP in the last 8760 hours. HbA1C: No results for input(s): HGBA1C in the last 72 hours. CBG:  Recent Labs Lab  04/12/16 1155 04/12/16 1719 04/12/16 2318 04/13/16 0819 04/13/16 1301  GLUCAP 116* 115* 87 84 85   Lipid Profile: No results for input(s): CHOL, HDL, LDLCALC, TRIG, CHOLHDL, LDLDIRECT in the last 72 hours. Thyroid Function Tests: No results for input(s): TSH, T4TOTAL, FREET4, T3FREE, THYROIDAB in the last 72 hours. Anemia Panel: No results for input(s): VITAMINB12, FOLATE, FERRITIN, TIBC, IRON, RETICCTPCT in the last 72 hours. Urine analysis:    Component Value Date/Time   COLORURINE YELLOW 04/09/2016 1230   APPEARANCEUR TURBID (A) 04/09/2016 1230   LABSPEC 1.014 04/09/2016 1230   PHURINE 6.0 04/09/2016 1230   GLUCOSEU NEGATIVE 04/09/2016 1230   HGBUR MODERATE (A) 04/09/2016 1230   BILIRUBINUR NEGATIVE 04/09/2016 1230   KETONESUR NEGATIVE 04/09/2016 1230   PROTEINUR 100 (A) 04/09/2016 1230   NITRITE NEGATIVE 04/09/2016 1230   LEUKOCYTESUR LARGE (A) 04/09/2016 1230   Sepsis Labs: @LABRCNTIP (procalcitonin:4,lacticidven:4)  ) Recent Results (from the past 240 hour(s))  MRSA PCR Screening     Status: None   Collection Time: 04/09/16 12:40 AM  Result Value Ref Range Status   MRSA by PCR NEGATIVE NEGATIVE Final    Comment:        The GeneXpert MRSA Assay (FDA approved for NASAL specimens only), is one component of a comprehensive MRSA colonization surveillance program. It is not intended to diagnose MRSA infection nor to guide or monitor treatment for MRSA infections.   Urine culture     Status: Abnormal   Collection Time: 04/09/16 12:30 PM  Result Value Ref Range Status   Specimen Description URINE, RANDOM  Final   Special Requests NONE  Final   Culture >=100,000 COLONIES/mL ESCHERICHIA COLI (A)  Final   Report Status 04/13/2016 FINAL  Final   Organism ID, Bacteria ESCHERICHIA COLI (A)  Final      Susceptibility   Escherichia coli - MIC*    AMPICILLIN >=32 RESISTANT Resistant     CEFAZOLIN 8 SENSITIVE Sensitive     CEFTRIAXONE <=1 SENSITIVE Sensitive      CIPROFLOXACIN >=4 RESISTANT Resistant     GENTAMICIN >=16 RESISTANT Resistant     IMIPENEM <=0.25 SENSITIVE Sensitive     NITROFURANTOIN <=16 SENSITIVE Sensitive     TRIMETH/SULFA <=20 SENSITIVE Sensitive     AMPICILLIN/SULBACTAM 16 INTERMEDIATE Intermediate     PIP/TAZO <=4 SENSITIVE Sensitive     * >=100,000 COLONIES/mL ESCHERICHIA COLI      Studies: No results found.  Scheduled Meds: . amLODipine  10 mg Oral Daily  . cefTRIAXone (ROCEPHIN)  IV  1 g Intravenous Q24H  . feeding supplement (ENSURE ENLIVE)  237 mL Oral TID BM  . heparin  5,000 Units Subcutaneous Q8H  . insulin aspart  0-9 Units Subcutaneous TID WC  . magnesium oxide  400 mg Oral BID  . metoprolol  5 mg Intravenous Q6H    Continuous Infusions: . sodium chloride 125 mL/hr at 04/13/16 0653     LOS: 4 days     Annita Brod, MD [facility] Pager [phone]  If 7PM-7AM, please contact night-coverage [email or web address] Password [email or web address] 04/13/2016, 1:36 PM

## 2016-04-13 NOTE — Progress Notes (Signed)
PHARMACY NOTE:  ANTIMICROBIAL RENAL DOSAGE ADJUSTMENT  Current antimicrobial regimen includes a mismatch between antimicrobial dosage and estimated renal function.  As per policy approved by the Pharmacy & Therapeutics and Medical Executive Committees, the antimicrobial dosage will be adjusted accordingly.  Current antimicrobial dosage:  Ancef 1 gram IV q8h  Indication: UTI  Renal Function:  Estimated Creatinine Clearance: 15.8 mL/min (by C-G formula based on SCr of 1.95 mg/dL (H)). []      On intermittent HD, scheduled: []      On CRRT    Antimicrobial dosage has been changed to:  Ancef 1 gram IV q12h    Thank you for allowing pharmacy to be a part of this patient's care.  Clayburn Pert, PharmD, BCPS Pager: [phone] 04/13/2016  2:14 PM

## 2016-04-14 LAB — GLUCOSE, CAPILLARY
GLUCOSE-CAPILLARY: 66 mg/dL (ref 65–99)
GLUCOSE-CAPILLARY: 113 mg/dL — AB (ref 65–99)
Glucose-Capillary: 78 mg/dL (ref 65–99)
Glucose-Capillary: 74 mg/dL (ref 65–99)
Glucose-Capillary: 85 mg/dL (ref 65–99)

## 2016-04-14 LAB — BASIC METABOLIC PANEL
ANION GAP: 9 (ref 5–15)
BUN: 34 mg/dL — ABNORMAL HIGH (ref 6–20)
CO2: 22 mmol/L (ref 22–32)
Calcium: 11.3 mg/dL — ABNORMAL HIGH (ref 8.9–10.3)
Chloride: 105 mmol/L (ref 101–111)
Creatinine, Ser: 1.92 mg/dL — ABNORMAL HIGH (ref 0.44–1.00)
GFR calc non Af Amer: 21 mL/min — ABNORMAL LOW (ref >60)
GFR, EST AFRICAN AMERICAN: 24 mL/min — AB (ref >60)
GLUCOSE: 88 mg/dL (ref 65–99)
POTASSIUM: 3.1 mmol/L — AB (ref 3.5–5.1)
Sodium: 136 mmol/L (ref 135–145)

## 2016-04-14 MED ORDER — DEXTROSE 50 % IV SOLN
INTRAVENOUS | Status: AC
  Administered 2016-04-14: 25 mL
  Filled 2016-04-14: qty 50

## 2016-04-14 MED ORDER — GUAIFENESIN-DM 100-10 MG/5ML PO SYRP
5.0000 mL | ORAL_SOLUTION | ORAL | Status: DC | PRN
  Filled 2016-04-14: qty 10

## 2016-04-14 NOTE — Progress Notes (Signed)
PROGRESS NOTE  Jill Allen [id number] DOB: 09-05-1915 DOA: 04/09/2016 PCP: [name], MD  HPI/Recap of past 19 hours: 83 year old woman with past medical history of diabetes mellitus and stage III chronic kidney disease admitted on 12/27 for acute encephalopathy, UTI, acute kidney injury and hypercalcemia. Patient started on IV fluids and IV Rocephin. Given a dose of pamidronate on 12/30.  Patient's creatinine and calcium continue to improve. Patient herself still with confusion.  Assessment/Plan: Principal Problem:   AKI (acute kidney injury) ([place]) in the setting of CKD (chronic kidney disease), stage III: In part from UTI, but also may be from hypercalcemia. Continues to improve. Hypernatremia: From hypercalcemia.  Cont 1/2 normal saline. Normalized. Active Problems:   UTI (urinary tract infection): E.coli, sensitivities note resistance to Cipro. D escalate Rocephin to Ancef   Dehydration Acute encephalopathy: Secondary to hypercalcemia.  ? At baseline.   DM (diabetes mellitus), type 2 with renal complications [facility]):  Dysphagia:with worsening confusion, will restrict diet  Elevated blood pressures: Patient may be in part volume overloading. We'll start to lower rate of IV fluids  Hypercalcemia: Cause?  Parathyroid hormone normal. Seems to have responded somewhat to pamidronate   Code Status: DO NOT RESUSCITATE   Family Communication: Left message with nephew   Disposition Plan: Potential discharge in next 24-48 hours   Consultants:  None   Procedures:  None   Antimicrobials:  IV Rocephin 12/28-12/31  IV Ancef 12/31-present  DVT prophylaxis: SCD's   Objective: Vitals:   04/13/16 1345 04/13/16 2059 04/14/16 0449 04/14/16 1247  BP: (!) 151/91 (!) 166/73 (!) 184/88 (!) 136/55  Pulse: 88 74 85 89  Resp: 18 18 16    Temp: 97.9 F (36.6 C) (!) 100.6 F (38.1 C) 98 F (36.7 C) 98.1 F (36.7 C)  TempSrc:  Tympanic Oral Oral  SpO2: 100% 100% 99%     Weight:      Height:        Intake/Output Summary (Last 24 hours) at 04/14/16 1331 Last data filed at 04/14/16 0600  Gross per 24 hour  Intake             3070 ml  Output                0 ml  Net             3070 ml   Filed Weights   04/09/16 1032 04/09/16 1857  Weight: 85.7 kg (189 lb) 76.7 kg (169 lb 1.5 oz)    Exam:   General:  Confused, oriented 1. About the same from previous day  Cardiovascular: Regular rate and rhythm, S1-S2   Respiratory: Clear to auscultation bilaterally   Abdomen: Soft, nontender, nondistended, positive bowel sounds   Musculoskeletal: Trace pitting edema   Skin: No skin breaks tears or lesions  Psychiatry: Patient acutely confused    Data Reviewed: CBC:  Recent Labs Lab 04/09/16 1135 04/10/16 0451 04/12/16 0406  WBC 9.5 10.2 8.5  NEUTROABS 6.7  --   --   HGB 13.3 13.1 12.2  HCT 40.5 39.8 38.7  MCV 87.9 87.5 90.6  PLT 292 278 123456   Basic Metabolic Panel:  Recent Labs Lab 04/10/16 0451 04/11/16 0417 04/12/16 0406 04/13/16 0532 04/14/16 0508  NA 145 149* 146* 142 136  K 2.8* 5.3* 4.8 3.8 3.1*  CL 108 117* 116* 109 105  CO2 27 24 24 23 22   GLUCOSE 126* 128* 114* 99 88  BUN 57* 50* 47* 38* 34*  CREATININE  2.08* 2.14* 2.28* 1.95* 1.92*  CALCIUM 12.4* 12.5* 12.9* 12.2* 11.3*   GFR: Estimated Creatinine Clearance: 16 mL/min (by C-G formula based on SCr of 1.92 mg/dL (H)). Liver Function Tests:  Recent Labs Lab 04/09/16 1135 04/11/16 0417  AST 25 27  ALT 10* 9*  ALKPHOS 120 116  BILITOT 0.7 0.6  PROT 8.2* 7.7  ALBUMIN 3.4* 3.2*   No results for input(s): LIPASE, AMYLASE in the last 168 hours. No results for input(s): AMMONIA in the last 168 hours. Coagulation Profile: No results for input(s): INR, PROTIME in the last 168 hours. Cardiac Enzymes: No results for input(s): CKTOTAL, CKMB, CKMBINDEX, TROPONINI in the last 168 hours. BNP (last 3 results) No results for input(s): PROBNP in the last 8760  hours. HbA1C: No results for input(s): HGBA1C in the last 72 hours. CBG:  Recent Labs Lab 04/13/16 1301 04/13/16 1723 04/13/16 2148 04/14/16 0819 04/14/16 1215  GLUCAP 85 92 102* 78 66   Lipid Profile: No results for input(s): CHOL, HDL, LDLCALC, TRIG, CHOLHDL, LDLDIRECT in the last 72 hours. Thyroid Function Tests: No results for input(s): TSH, T4TOTAL, FREET4, T3FREE, THYROIDAB in the last 72 hours. Anemia Panel: No results for input(s): VITAMINB12, FOLATE, FERRITIN, TIBC, IRON, RETICCTPCT in the last 72 hours. Urine analysis:    Component Value Date/Time   COLORURINE YELLOW 04/09/2016 1230   APPEARANCEUR TURBID (A) 04/09/2016 1230   LABSPEC 1.014 04/09/2016 1230   PHURINE 6.0 04/09/2016 1230   GLUCOSEU NEGATIVE 04/09/2016 1230   HGBUR MODERATE (A) 04/09/2016 1230   BILIRUBINUR NEGATIVE 04/09/2016 1230   KETONESUR NEGATIVE 04/09/2016 1230   PROTEINUR 100 (A) 04/09/2016 1230   NITRITE NEGATIVE 04/09/2016 1230   LEUKOCYTESUR LARGE (A) 04/09/2016 1230   Sepsis Labs: @LABRCNTIP (procalcitonin:4,lacticidven:4)  ) Recent Results (from the past 240 hour(s))  MRSA PCR Screening     Status: None   Collection Time: 04/09/16 12:40 AM  Result Value Ref Range Status   MRSA by PCR NEGATIVE NEGATIVE Final    Comment:        The GeneXpert MRSA Assay (FDA approved for NASAL specimens only), is one component of a comprehensive MRSA colonization surveillance program. It is not intended to diagnose MRSA infection nor to guide or monitor treatment for MRSA infections.   Urine culture     Status: Abnormal   Collection Time: 04/09/16 12:30 PM  Result Value Ref Range Status   Specimen Description URINE, RANDOM  Final   Special Requests NONE  Final   Culture >=100,000 COLONIES/mL ESCHERICHIA COLI (A)  Final   Report Status 04/13/2016 FINAL  Final   Organism ID, Bacteria ESCHERICHIA COLI (A)  Final      Susceptibility   Escherichia coli - MIC*    AMPICILLIN >=32 RESISTANT  Resistant     CEFAZOLIN 8 SENSITIVE Sensitive     CEFTRIAXONE <=1 SENSITIVE Sensitive     CIPROFLOXACIN >=4 RESISTANT Resistant     GENTAMICIN >=16 RESISTANT Resistant     IMIPENEM <=0.25 SENSITIVE Sensitive     NITROFURANTOIN <=16 SENSITIVE Sensitive     TRIMETH/SULFA <=20 SENSITIVE Sensitive     AMPICILLIN/SULBACTAM 16 INTERMEDIATE Intermediate     PIP/TAZO <=4 SENSITIVE Sensitive     * >=100,000 COLONIES/mL ESCHERICHIA COLI      Studies: No results found.  Scheduled Meds: . amLODipine  10 mg Oral Daily  .  ceFAZolin (ANCEF) IV  1 g Intravenous Q12H  . feeding supplement (ENSURE ENLIVE)  237 mL Oral TID BM  . heparin  5,000 Units Subcutaneous Q8H  . insulin aspart  0-9 Units Subcutaneous TID WC  . magnesium oxide  400 mg Oral BID  . metoprolol  5 mg Intravenous Q6H    Continuous Infusions: . sodium chloride 125 mL/hr at 04/14/16 0800     LOS: 5 days     Annita Brod, MD [facility] Pager [phone]  If 7PM-7AM, please contact night-coverage [email or web address] Password [email or web address] 04/14/2016, 1:31 PM

## 2016-04-14 NOTE — Progress Notes (Signed)
1.83 second pause. MD notified

## 2016-04-14 NOTE — Progress Notes (Signed)
Hypoglycemic Event  CBG: 66  Treatment: D50 IV 25 mL  Symptoms: Sweaty  Follow-up CBG: Time:1355 CBG Result:113  Possible Reasons for Event: Inadequate meal intake  Comments/MD notified:hypoglycemic protocol    Illa Level

## 2016-04-15 LAB — BASIC METABOLIC PANEL
ANION GAP: 9 (ref 5–15)
BUN: 35 mg/dL — ABNORMAL HIGH (ref 6–20)
CALCIUM: 10.1 mg/dL (ref 8.9–10.3)
CHLORIDE: 104 mmol/L (ref 101–111)
CO2: 22 mmol/L (ref 22–32)
Creatinine, Ser: 1.74 mg/dL — ABNORMAL HIGH (ref 0.44–1.00)
GFR calc Af Amer: 27 mL/min — ABNORMAL LOW (ref >60)
GFR calc non Af Amer: 23 mL/min — ABNORMAL LOW (ref >60)
GLUCOSE: 69 mg/dL (ref 65–99)
Potassium: 3 mmol/L — ABNORMAL LOW (ref 3.5–5.1)
Sodium: 135 mmol/L (ref 135–145)

## 2016-04-15 LAB — CBC
HCT: 31.9 % — ABNORMAL LOW (ref 36.0–46.0)
HEMOGLOBIN: 10.7 g/dL — AB (ref 12.0–15.0)
MCH: 29.3 pg (ref 26.0–34.0)
MCHC: 33.5 g/dL (ref 30.0–36.0)
MCV: 87.4 fL (ref 78.0–100.0)
Platelets: 190 10*3/uL (ref 150–400)
RBC: 3.65 MIL/uL — ABNORMAL LOW (ref 3.87–5.11)
RDW: 14.2 % (ref 11.5–15.5)
WBC: 5.1 10*3/uL (ref 4.0–10.5)

## 2016-04-15 LAB — GLUCOSE, CAPILLARY
GLUCOSE-CAPILLARY: 67 mg/dL (ref 65–99)
GLUCOSE-CAPILLARY: 104 mg/dL — AB (ref 65–99)
GLUCOSE-CAPILLARY: 80 mg/dL (ref 65–99)
Glucose-Capillary: 60 mg/dL — ABNORMAL LOW (ref 65–99)
Glucose-Capillary: 112 mg/dL — ABNORMAL HIGH (ref 65–99)
Glucose-Capillary: 82 mg/dL (ref 65–99)

## 2016-04-15 MED ORDER — LOPERAMIDE HCL 2 MG PO CAPS
2.0000 mg | ORAL_CAPSULE | Freq: Once | ORAL | Status: AC
  Administered 2016-04-15: 2 mg via ORAL
  Filled 2016-04-15: qty 1

## 2016-04-15 MED ORDER — RESOURCE THICKENUP CLEAR PO POWD
ORAL | Status: DC | PRN
  Filled 2016-04-15 (×2): qty 125

## 2016-04-15 MED ORDER — LOPERAMIDE HCL 2 MG PO CAPS: 2.0000 mg | ORAL_CAPSULE | ORAL | Status: DC | PRN

## 2016-04-15 MED ORDER — POTASSIUM CHLORIDE CRYS ER 20 MEQ PO TBCR: 40.0000 meq | EXTENDED_RELEASE_TABLET | Freq: Two times a day (BID) | ORAL | Status: DC

## 2016-04-15 MED ORDER — POTASSIUM CHLORIDE IN NACL 40-0.9 MEQ/L-% IV SOLN
INTRAVENOUS | Status: DC
  Administered 2016-04-15: 100 mL/h via INTRAVENOUS
  Filled 2016-04-15: qty 1000

## 2016-04-15 MED ORDER — POTASSIUM CHLORIDE 20 MEQ/15ML (10%) PO SOLN
40.0000 meq | Freq: Two times a day (BID) | ORAL | Status: DC
  Administered 2016-04-15: 40 meq via ORAL
  Filled 2016-04-15: qty 30

## 2016-04-15 MED ORDER — FUROSEMIDE 10 MG/ML IJ SOLN
40.0000 mg | Freq: Two times a day (BID) | INTRAMUSCULAR | Status: AC
  Administered 2016-04-15 – 2016-04-16 (×2): 40 mg via INTRAVENOUS
  Filled 2016-04-15 (×2): qty 4

## 2016-04-15 MED ORDER — ONDANSETRON HCL 4 MG/2ML IJ SOLN: 4.0000 mg | Freq: Four times a day (QID) | INTRAMUSCULAR | Status: DC | PRN

## 2016-04-15 NOTE — Progress Notes (Signed)
Speech Language Pathology Treatment: Dysphagia  Patient Details Name: Jill Allen MRN: [id number] DOB: 1916/03/27 Today's Date: 04/15/2016 Time: [id number] SLP Time Calculation (min) (ACUTE ONLY): 14 min  Assessment / Plan / Recommendation Clinical Impression  Pt asleep in bed but easily awoken to SLP verbal/visual stimulation.  She c/o nausea - RN informed. RN and NT report pt with frequent coughing during po attempts today.   Coating on tongue noted (brown tinged) and per chart review, pt with prior dx of thrush. ? If this may contribute to her dysphagia, poor intake.   Congested cough at baseline observed.  Oral moisture/care provided with toothette/oral suction due to open mouth breathing/xerostomia.  Pt willing to consume water and tea but declined solids.    Overt explosive cough post-swallow with thin via tsp - after which pt admitted this to be normal - uncertain to pt's ability to provide historical information.  Use of thickener to nectar and providing liquids via tsp very helpful in decreasing cough to 10% vs 90%. Pt admits to increased comfort with thicker liquids.   Cup boluses of nectar resulted in multiple swallows and delayed cough - concern for residuals.    Currently pt's very dysarthric nearly unintelligible and RN reports she was able to articulate fairly well earlier today.  SlP will follow up next am to determine tolerance/po advancement readiness.    Recommend downgrade diet to nectar liquids =and recommend maximize liquid nutritional intake *eg Ensure if pt can tolerate/will accept.     Given advanced age and continued poor intake, would recommend to consider palliative consult to aid in goals of care.     HPI HPI: 83 year old woman currently at short-term rehabilitation, presents with failure to thrive, confusion. Initial evaluation revealed acute kidney injury, severe dehydration, oral thrush, UTI.      SLP Plan  Continue with current plan of care      Recommendations  Diet recommendations: Dysphagia 1 (puree);Nectar-thick liquid Liquids provided via: Teaspoon Medication Administration: Crushed with puree Supervision: Staff to assist with self feeding Compensations: Small sips/bites;Slow rate;Other (Comment) (oral care before and after meals) Postural Changes and/or Swallow Maneuvers: Seated upright 90 degrees;Upright 30-60 min after meal                Oral Care Recommendations: Oral care QID Follow up Recommendations: Skilled Nursing facility Plan: Continue with current plan of care       [place], [place], [place] [facility] SLP [phone]

## 2016-04-15 NOTE — Progress Notes (Signed)
PROGRESS NOTE  Jill Allen [id number] DOB: Jun 29, 1915 DOA: 04/09/2016 PCP: [name], MD  HPI/Recap of past 76 hours: 83 year old woman with past medical history of diabetes mellitus and stage III chronic kidney disease admitted on 12/27 for acute encephalopathy, UTI, acute kidney injury and hypercalcemia. Patient started on IV fluids and IV Rocephin. Given a dose of pamidronate on 12/30.  Today, patient's creatinine down to 1.74 (baseline around 1.4) and she is more awake and alert. She denies any complaints and wants to know when she can go home.  Assessment/Plan: Principal Problem:   AKI (acute kidney injury) ([place]) in the setting of CKD (chronic kidney disease), stage III: In part from UTI, but also may be from hypercalcemia. Continues to improve. Almost at baseline. In concern because she did come in significantly volume depleted, she is now +15 L and I do not want to send her out with volume overload. We'll try gentle diuresis for the next 24 hours. Hypernatremia: From hypercalcemia.  Have stopped IV fluids. Normalized. Active Problems:   UTI (urinary tract infection): E.coli, sensitivities note resistance to Cipro. D escalate Rocephin to Ancef.  Stop antibiotics after today.   Dehydration: almost fully rehydrated Acute encephalopathy: Secondary to hypercalcemia.  Her calcium levels back to normal and renal function improving, she appears more alert and oriented and I think that she is at her baseline.   DM (diabetes mellitus), type 2 with renal complications ([place]): Stable. Dysphagia: Given her improvement in confusion, will have speech therapy see if we can upgrade her diet  Elevated blood pressures: Patient may be in part volume overloading. We'll try some gentle Lasix  Hypercalcemia: Cause?  Parathyroid hormone normal. Seems to have responded somewhat to pamidronate   Code Status: DO NOT RESUSCITATE   Family Communication: Left message with nephew   Disposition  Plan: Anticipate discharge tomorrow   Consultants:  None   Procedures:  None   Antimicrobials:  IV Rocephin 12/28-12/31  IV Ancef 12/31-1/2  DVT prophylaxis: SCD's   Objective: Vitals:   04/14/16 1247 04/14/16 2238 04/15/16 0647 04/15/16 1153  BP: (!) 136/55 (!) 156/80 (!) 160/84 (!) 145/79  Pulse: 89 78 89   Resp:  18 18   Temp: 98.1 F (36.7 C) 98.4 F (36.9 C) 98.2 F (36.8 C)   TempSrc: Oral Oral Oral   SpO2: 100% 99% 98%   Weight:      Height:        Intake/Output Summary (Last 24 hours) at 04/15/16 1413 Last data filed at 04/15/16 0600  Gross per 24 hour  Intake          1684.58 ml  Output                0 ml  Net          1684.58 ml   Filed Weights   04/09/16 1032 04/09/16 1857  Weight: 85.7 kg (189 lb) 76.7 kg (169 lb 1.5 oz)    Exam:   General:  Some confusion, but overall more alert, oriented 2  Cardiovascular: Regular rate and rhythm, S1-S2   Respiratory: Clear to auscultation bilaterally   Abdomen: Soft, nontender, nondistended, positive bowel sounds   Musculoskeletal: Trace pitting edema   Skin: No skin breaks tears or lesions  Psychiatry: Some underlying dementia, but improved. More alert    Data Reviewed: CBC:  Recent Labs Lab 04/09/16 1135 04/10/16 0451 04/12/16 0406 04/15/16 0339  WBC 9.5 10.2 8.5 5.1  NEUTROABS 6.7  --   --   --  HGB 13.3 13.1 12.2 10.7*  HCT 40.5 39.8 38.7 31.9*  MCV 87.9 87.5 90.6 87.4  PLT 292 278 266 [id number]   Basic Metabolic Panel:  Recent Labs Lab 04/11/16 0417 04/12/16 0406 04/13/16 0532 04/14/16 0508 04/15/16 0339  NA 149* 146* 142 136 135  K 5.3* 4.8 3.8 3.1* 3.0*  CL 117* 116* 109 105 104  CO2 24 24 23 22 22   GLUCOSE 128* 114* 99 88 69  BUN 50* 47* 38* 34* 35*  CREATININE 2.14* 2.28* 1.95* 1.92* 1.74*  CALCIUM 12.5* 12.9* 12.2* 11.3* 10.1   GFR: Estimated Creatinine Clearance: 17.7 mL/min (by C-G formula based on SCr of 1.74 mg/dL (H)). Liver Function Tests:  Recent  Labs Lab 04/09/16 1135 04/11/16 0417  AST 25 27  ALT 10* 9*  ALKPHOS 120 116  BILITOT 0.7 0.6  PROT 8.2* 7.7  ALBUMIN 3.4* 3.2*   No results for input(s): LIPASE, AMYLASE in the last 168 hours. No results for input(s): AMMONIA in the last 168 hours. Coagulation Profile: No results for input(s): INR, PROTIME in the last 168 hours. Cardiac Enzymes: No results for input(s): CKTOTAL, CKMB, CKMBINDEX, TROPONINI in the last 168 hours. BNP (last 3 results) No results for input(s): PROBNP in the last 8760 hours. HbA1C: No results for input(s): HGBA1C in the last 72 hours. CBG:  Recent Labs Lab 04/14/16 2237 04/15/16 0740 04/15/16 0852 04/15/16 1229 04/15/16 1324  GLUCAP 85 60* 112* 67 82   Lipid Profile: No results for input(s): CHOL, HDL, LDLCALC, TRIG, CHOLHDL, LDLDIRECT in the last 72 hours. Thyroid Function Tests: No results for input(s): TSH, T4TOTAL, FREET4, T3FREE, THYROIDAB in the last 72 hours. Anemia Panel: No results for input(s): VITAMINB12, FOLATE, FERRITIN, TIBC, IRON, RETICCTPCT in the last 72 hours. Urine analysis:    Component Value Date/Time   COLORURINE YELLOW 04/09/2016 1230   APPEARANCEUR TURBID (A) 04/09/2016 1230   LABSPEC 1.014 04/09/2016 1230   PHURINE 6.0 04/09/2016 1230   GLUCOSEU NEGATIVE 04/09/2016 1230   HGBUR MODERATE (A) 04/09/2016 1230   BILIRUBINUR NEGATIVE 04/09/2016 1230   KETONESUR NEGATIVE 04/09/2016 1230   PROTEINUR 100 (A) 04/09/2016 1230   NITRITE NEGATIVE 04/09/2016 1230   LEUKOCYTESUR LARGE (A) 04/09/2016 1230   Sepsis Labs: @LABRCNTIP (procalcitonin:4,lacticidven:4)  ) Recent Results (from the past 240 hour(s))  MRSA PCR Screening     Status: None   Collection Time: 04/09/16 12:40 AM  Result Value Ref Range Status   MRSA by PCR NEGATIVE NEGATIVE Final    Comment:        The GeneXpert MRSA Assay (FDA approved for NASAL specimens only), is one component of a comprehensive MRSA colonization surveillance program. It is  not intended to diagnose MRSA infection nor to guide or monitor treatment for MRSA infections.   Urine culture     Status: Abnormal   Collection Time: 04/09/16 12:30 PM  Result Value Ref Range Status   Specimen Description URINE, RANDOM  Final   Special Requests NONE  Final   Culture >=100,000 COLONIES/mL ESCHERICHIA COLI (A)  Final   Report Status 04/13/2016 FINAL  Final   Organism ID, Bacteria ESCHERICHIA COLI (A)  Final      Susceptibility   Escherichia coli - MIC*    AMPICILLIN >=32 RESISTANT Resistant     CEFAZOLIN 8 SENSITIVE Sensitive     CEFTRIAXONE <=1 SENSITIVE Sensitive     CIPROFLOXACIN >=4 RESISTANT Resistant     GENTAMICIN >=16 RESISTANT Resistant     IMIPENEM <=0.25 SENSITIVE Sensitive  NITROFURANTOIN <=16 SENSITIVE Sensitive     TRIMETH/SULFA <=20 SENSITIVE Sensitive     AMPICILLIN/SULBACTAM 16 INTERMEDIATE Intermediate     PIP/TAZO <=4 SENSITIVE Sensitive     * >=100,000 COLONIES/mL ESCHERICHIA COLI      Studies: No results found.  Scheduled Meds: . amLODipine  10 mg Oral Daily  .  ceFAZolin (ANCEF) IV  1 g Intravenous Q12H  . feeding supplement (ENSURE ENLIVE)  237 mL Oral TID BM  . heparin  5,000 Units Subcutaneous Q8H  . insulin aspart  0-9 Units Subcutaneous TID WC  . magnesium oxide  400 mg Oral BID  . metoprolol  5 mg Intravenous Q6H    Continuous Infusions: . 0.9 % NaCl with KCl 40 mEq / L       LOS: 6 days     Annita Brod, MD [facility] Pager [phone]  If 7PM-7AM, please contact night-coverage [email or web address] Password [email or web address] 04/15/2016, 2:13 PM

## 2016-04-15 NOTE — Clinical Social Work Placement (Signed)
CSW following for discharge to [facility] when medically ready. CSW has completed FL2 & will continue to follow and assist.    Raynaldo Opitz, [place] Social Worker cell #: [phone]   CLINICAL SOCIAL WORK PLACEMENT  NOTE  Date:  04/15/2016  Patient Details  Name: Jill Allen MRN: [id number] Date of Birth: [birth year]  Clinical Social Work is seeking post-discharge placement for this patient at the [place] level of care (*CSW will initial, date and re-position this form in  chart as items are completed):  Yes   Patient/family provided with [place] Work Department's list of facilities offering this level of care within the geographic area requested by the patient (or if unable, by the patient's family).  Yes   Patient/family informed of their freedom to choose among providers that offer the needed level of care, that participate in Medicare, Medicaid or managed care program needed by the patient, have an available bed and are willing to accept the patient.  Yes   Patient/family informed of [place]'s ownership interest in [facility] and [facility], as well as of the fact that they are under no obligation to receive care at these facilities.  PASRR submitted to EDS on 04/10/16     PASRR number received on 04/10/16     Existing PASRR number confirmed on       FL2 transmitted to all facilities in geographic area requested by pt/family on 04/10/16     FL2 transmitted to all facilities within larger geographic area on       Patient informed that his/her managed care company has contracts with or will negotiate with certain facilities, including the following:        Yes   Patient/family informed of bed offers received.  Patient chooses bed at [facility]     Physician recommends and patient chooses bed at      Patient to be transferred to [place] on  .  Patient to be transferred to  facility by       Patient family notified on   of transfer.  Name of family member notified:        PHYSICIAN       Additional Comment:    _______________________________________________ Standley Brooking, LCSW 04/15/2016, 9:56 AM

## 2016-04-16 DIAGNOSIS — E1122 Type 2 diabetes mellitus with diabetic chronic kidney disease: Secondary | ICD-10-CM | POA: Diagnosis not present

## 2016-04-16 DIAGNOSIS — M79631 Pain in right forearm: Secondary | ICD-10-CM | POA: Diagnosis not present

## 2016-04-16 DIAGNOSIS — F0391 Unspecified dementia with behavioral disturbance: Secondary | ICD-10-CM

## 2016-04-16 DIAGNOSIS — R4182 Altered mental status, unspecified: Secondary | ICD-10-CM | POA: Diagnosis not present

## 2016-04-16 DIAGNOSIS — I10 Essential (primary) hypertension: Secondary | ICD-10-CM | POA: Diagnosis not present

## 2016-04-16 DIAGNOSIS — K222 Esophageal obstruction: Secondary | ICD-10-CM | POA: Diagnosis not present

## 2016-04-16 DIAGNOSIS — M109 Gout, unspecified: Secondary | ICD-10-CM | POA: Diagnosis not present

## 2016-04-16 DIAGNOSIS — R1313 Dysphagia, pharyngeal phase: Secondary | ICD-10-CM | POA: Diagnosis not present

## 2016-04-16 DIAGNOSIS — M25531 Pain in right wrist: Secondary | ICD-10-CM | POA: Diagnosis not present

## 2016-04-16 DIAGNOSIS — G9340 Encephalopathy, unspecified: Secondary | ICD-10-CM | POA: Diagnosis not present

## 2016-04-16 DIAGNOSIS — E118 Type 2 diabetes mellitus with unspecified complications: Secondary | ICD-10-CM | POA: Diagnosis not present

## 2016-04-16 DIAGNOSIS — N390 Urinary tract infection, site not specified: Secondary | ICD-10-CM | POA: Diagnosis not present

## 2016-04-16 DIAGNOSIS — E11622 Type 2 diabetes mellitus with other skin ulcer: Secondary | ICD-10-CM | POA: Diagnosis not present

## 2016-04-16 DIAGNOSIS — R1310 Dysphagia, unspecified: Secondary | ICD-10-CM

## 2016-04-16 DIAGNOSIS — N289 Disorder of kidney and ureter, unspecified: Secondary | ICD-10-CM | POA: Diagnosis not present

## 2016-04-16 DIAGNOSIS — N183 Chronic kidney disease, stage 3 (moderate): Secondary | ICD-10-CM | POA: Diagnosis not present

## 2016-04-16 DIAGNOSIS — A0471 Enterocolitis due to Clostridium difficile, recurrent: Secondary | ICD-10-CM | POA: Diagnosis not present

## 2016-04-16 DIAGNOSIS — Z7189 Other specified counseling: Secondary | ICD-10-CM | POA: Diagnosis not present

## 2016-04-16 DIAGNOSIS — R600 Localized edema: Secondary | ICD-10-CM | POA: Diagnosis not present

## 2016-04-16 DIAGNOSIS — E119 Type 2 diabetes mellitus without complications: Secondary | ICD-10-CM | POA: Diagnosis not present

## 2016-04-16 DIAGNOSIS — R531 Weakness: Secondary | ICD-10-CM | POA: Diagnosis not present

## 2016-04-16 DIAGNOSIS — F05 Delirium due to known physiological condition: Secondary | ICD-10-CM

## 2016-04-16 DIAGNOSIS — R633 Feeding difficulties: Secondary | ICD-10-CM | POA: Diagnosis not present

## 2016-04-16 DIAGNOSIS — R946 Abnormal results of thyroid function studies: Secondary | ICD-10-CM | POA: Diagnosis not present

## 2016-04-16 DIAGNOSIS — E860 Dehydration: Secondary | ICD-10-CM | POA: Diagnosis not present

## 2016-04-16 DIAGNOSIS — N179 Acute kidney failure, unspecified: Secondary | ICD-10-CM | POA: Diagnosis not present

## 2016-04-16 DIAGNOSIS — M1A09X0 Idiopathic chronic gout, multiple sites, without tophus (tophi): Secondary | ICD-10-CM | POA: Diagnosis not present

## 2016-04-16 LAB — BASIC METABOLIC PANEL
ANION GAP: 10 (ref 5–15)
BUN: 33 mg/dL — ABNORMAL HIGH (ref 6–20)
CALCIUM: 10.3 mg/dL (ref 8.9–10.3)
CHLORIDE: 107 mmol/L (ref 101–111)
CO2: 22 mmol/L (ref 22–32)
Creatinine, Ser: 1.8 mg/dL — ABNORMAL HIGH (ref 0.44–1.00)
GFR calc non Af Amer: 22 mL/min — ABNORMAL LOW (ref >60)
GFR, EST AFRICAN AMERICAN: 26 mL/min — AB (ref >60)
Glucose, Bld: 90 mg/dL (ref 65–99)
Potassium: 3.1 mmol/L — ABNORMAL LOW (ref 3.5–5.1)
Sodium: 139 mmol/L (ref 135–145)

## 2016-04-16 LAB — GLUCOSE, CAPILLARY
Glucose-Capillary: 94 mg/dL (ref 65–99)
Glucose-Capillary: 108 mg/dL — ABNORMAL HIGH (ref 65–99)

## 2016-04-16 MED ORDER — GUAIFENESIN-DM 100-10 MG/5ML PO SYRP: 5.0000 mL | ORAL_SOLUTION | ORAL | 0 refills | Status: AC | PRN

## 2016-04-16 NOTE — Care Management Important Message (Signed)
Important Message  Patient Details  Name: Eleisha Antonio MRN: [id number] Date of Birth: [birth year]   Medicare Important Message Given:  Yes    Kerin Salen 04/16/2016, 12:01 [place] Message  Patient Details  Name: Adaysia Faidley MRN: [id number] Date of Birth: February 22, 1916   Medicare Important Message Given:  Yes    Kerin Salen 04/16/2016, 12:01 PM

## 2016-04-16 NOTE — Discharge Summary (Signed)
Discharge Summary  Jill Allen [id number] DOB: 14-Jul-1915  PCP: [name], MD  Admit date: 04/09/2016 Discharge date: 04/16/2016  Time spent: 25 minutes   Recommendations for Outpatient Follow-up:  1. Patient being discharged back to skilled nursing 2. See below for new dietary recommendations with restrictions   Discharge Diagnoses:  Active Hospital Problems   Diagnosis Date Noted  . AKI (acute kidney injury) ([place]) 04/09/2016  . Acute lower UTI   . Hypercalcemia 04/09/2016  . Dehydration 02/06/2016  . CKD (chronic kidney disease), stage III 02/06/2016  . DM (diabetes mellitus), type 2 with renal complications ([place]) 0000000  . UTI (urinary tract infection) 02/06/2016    Resolved Hospital Problems   Diagnosis Date Noted Date Resolved  No resolved problems to display.    Discharge Condition: Improved, being discharged back to skilled nursing   Diet recommendation: Diet recommendations: Dysphagia 1 (puree);Nectar-thick liquid Liquids provided via: Teaspoon Medication Administration: Crushed with puree Supervision: Staff to assist with self feeding Compensations: Small sips/bites;Slow rate;Other (Comment) (oral care before and after meals) Postural Changes and/or Swallow Maneuvers: Seated upright 90 degrees;Upright 30-60 min after meal   Vitals:   04/15/16 2026 04/16/16 0538  BP: 130/79 127/84  Pulse: 71 97  Resp: 16 18  Temp: 99.6 F (37.6 C) 98.4 F (36.9 C)    History of present illness:  Patient is a 83 year old female past history of chronic kidney disease, diabetes mellitus and mild dementia admitted on 12/27 for acute encephalopathy urinary tract infection and acute injury and found to be severely volume depleted. This and found that she was hypercalcemic. Patient was admitted to the hospitalist service.  Hospital Course:  Principal Problem:   AKI (acute kidney injury) ([place]) in the setting of stage III chronic kidney disease from diabetes  mellitus: Patient started on aggressive IV fluid resuscitation. Her initial creatinine on admission was 2.56 with a baseline of 1.5. By 1/2, creatinine down to 1.7 and patient much more awake and alert and oriented. This point, she was +15 L of fluid needed. Cause of acute kidney injury felt to be secondary to hypercalcemia and urinary tract infection. Active Problems:   UTI (urinary tract infection): Urine cultures grew a pansensitive Escherichia coli. Initially on Rocephin and then narrowed down to Ancef. Patient completed 6 days of antibiotics and will not need any upon discharge.    Dehydration: As above    DM (diabetes mellitus), type 2 with renal complications (H[facility])   Hypercalcemia: Unclear etiology. Patient required aggressive IV fluid resuscitation plus one dose of IV pamidronate. Calcium on admission was elevated at 13.5 and by 1/2 down to 10.1.  Senile dementia with acute behavioral disturbance/acute encephalopathy: Secondary to hypercalcemia and acute kidney injury. With restoration of her renal function and improvement of her calcium levels, patient back at her baseline where she is alert and oriented 1, but interactive. She will return back to her skilled nursing facility  Dysphagia secondary to dementia: With patient's acute encephalopathy, unable to swallow properly. Seen by speech therapy and made nothing by mouth as aspiration risk. As she became more awake and alert, seen by speech therapy and recommended to have a dysphagia 1 diet with nectar thick liquids, liquids to be given via teaspoon with full aspiration precautions.  Procedures:  None   Consultations:  None   Discharge Exam: BP 127/84 (BP Location: Left Arm)   Pulse 97   Temp 98.4 F (36.9 C) (Axillary)   Resp 18   Ht 5\' 3"  (1.6 m)  Wt 76.7 kg (169 lb 1.5 oz)   SpO2 100%   BMI 29.95 kg/m   General: Alert and oriented 1  Cardiovascular: Regular rate and rhythm, S1-S2  Respiratory: Clear to auscultation  bilaterally   Discharge Instructions You were cared for by a hospitalist during your hospital stay. If you have any questions about your discharge medications or the care you received while you were in the hospital after you are discharged, you can call the unit and asked to speak with the hospitalist on call if the hospitalist that took care of you is not available. Once you are discharged, your primary care physician will handle any further medical issues. Please note that NO REFILLS for any discharge medications will be authorized once you are discharged, as it is imperative that you return to your primary care physician (or establish a relationship with a primary care physician if you do not have one) for your aftercare needs so that they can reassess your need for medications and monitor your lab values.  Discharge Instructions    DIET - DYS 1    Complete by:  As directed    Liquids provided via: Teaspoon Medication Administration: Crushed with puree Supervision: Staff to assist with self feeding Compensations: Small sips/bites;Slow rate;Other (Comment) (oral care before and after meals) Postural Changes and/or Swallow Maneuvers: Seated upright 90 degrees;Upright 30-60 min after meal   Fluid consistency:  Nectar Thick     Allergies as of 04/16/2016   No Known Allergies     Medication List    STOP taking these medications   cefTRIAXone 1 g injection Commonly known as:  ROCEPHIN   lidocaine 1 % (with preservative) injection Commonly known as:  XYLOCAINE   LIDOCAINE HCL IJ     TAKE these medications   acetaminophen 500 MG tablet Commonly known as:  TYLENOL Take 1,000 mg by mouth every 8 (eight) hours as needed for moderate pain.   amLODipine 10 MG tablet Commonly known as:  NORVASC Take 10 mg by mouth daily.   aspirin EC 81 MG tablet Take 81 mg by mouth daily.   atorvastatin 40 MG tablet Commonly known as:  LIPITOR Take 40 mg by mouth daily.   ergocalciferol 50000  units capsule Commonly known as:  VITAMIN D2 Take 50,000 Units by mouth 2 (two) times daily.   feeding supplement (PRO-STAT SUGAR FREE 64) Liqd Take 30 mLs by mouth 2 (two) times daily.   guaiFENesin-dextromethorphan 100-10 MG/5ML syrup Commonly known as:  ROBITUSSIN DM Take 5 mLs by mouth every 4 (four) hours as needed for cough.   magnesium oxide 400 (241.3 Mg) MG tablet Commonly known as:  MAG-OX Take 1 tablet (400 mg total) by mouth 2 (two) times daily.   metoprolol succinate 100 MG 24 hr tablet Commonly known as:  TOPROL-XL Take 100 mg by mouth daily. Take with or immediately following a meal.   NUTRITIONAL DRINK PO Take 120 mLs by mouth 3 (three) times daily.   OCUVITE EXTRA Tabs Take 1 tablet by mouth daily.   vitamin C 500 MG tablet Commonly known as:  ASCORBIC ACID Take 500 mg by mouth 2 (two) times daily.      No Known Allergies    The results of significant diagnostics from this hospitalization (including imaging, microbiology, ancillary and laboratory) are listed below for reference.    Significant Diagnostic Studies: US Renal  Result Date: 04/11/2016 CLINICAL DATA:  Acute kidney injury.  Hypercalcemia. EXAM: RENAL / URINARY TRACT ULTRASOUND COMPLETE  COMPARISON:  Renal ultrasound 02/07/2016 FINDINGS: Right Kidney: Length: 7.8 cm. There is renal cortical atrophy and thinning of renal parenchyma, increased renal echogenicity. Pelvic fullness appear similar to prior exam. No frank hydronephrosis. Left Kidney: Length: 7.7 cm. There is renal cortical atrophy and thinning of renal parenchyma, increased renal echogenicity. No hydronephrosis. Bladder: Appears normal for degree of bladder distention. Layering debris on prior exam is no longer present. IMPRESSION: 1. No hydronephrosis or obstructive uropathy. 2. Renal cortical thinning with increased renal echogenicity consistent with chronic medical renal disease. Electronically Signed   By: Jeb Levering M.D.   On:  04/11/2016 00:20   Dg Chest Port 1 View  Result Date: 04/09/2016 CLINICAL DATA:  Generalized weakness. EXAM: PORTABLE CHEST 1 VIEW COMPARISON:  February 21, 2016 FINDINGS: Mild opacity in the lateral left lung base is favored to represent atelectasis. No other interval changes or acute abnormalities. IMPRESSION: Mild opacity in the lateral left lung base may represent atelectasis. A PA and lateral chest x-ray could better evaluate. Electronically Signed   By: Dorise Bullion III M.D   On: 04/09/2016 11:19    Microbiology: Recent Results (from the past 240 hour(s))  MRSA PCR Screening     Status: None   Collection Time: 04/09/16 12:40 AM  Result Value Ref Range Status   MRSA by PCR NEGATIVE NEGATIVE Final    Comment:        The GeneXpert MRSA Assay (FDA approved for NASAL specimens only), is one component of a comprehensive MRSA colonization surveillance program. It is not intended to diagnose MRSA infection nor to guide or monitor treatment for MRSA infections.   Urine culture     Status: Abnormal   Collection Time: 04/09/16 12:30 PM  Result Value Ref Range Status   Specimen Description URINE, RANDOM  Final   Special Requests NONE  Final   Culture >=100,000 COLONIES/mL ESCHERICHIA COLI (A)  Final   Report Status 04/13/2016 FINAL  Final   Organism ID, Bacteria ESCHERICHIA COLI (A)  Final      Susceptibility   Escherichia coli - MIC*    AMPICILLIN >=32 RESISTANT Resistant     CEFAZOLIN 8 SENSITIVE Sensitive     CEFTRIAXONE <=1 SENSITIVE Sensitive     CIPROFLOXACIN >=4 RESISTANT Resistant     GENTAMICIN >=16 RESISTANT Resistant     IMIPENEM <=0.25 SENSITIVE Sensitive     NITROFURANTOIN <=16 SENSITIVE Sensitive     TRIMETH/SULFA <=20 SENSITIVE Sensitive     AMPICILLIN/SULBACTAM 16 INTERMEDIATE Intermediate     PIP/TAZO <=4 SENSITIVE Sensitive     * >=100,000 COLONIES/mL ESCHERICHIA COLI     Labs: Basic Metabolic Panel:  Recent Labs Lab 04/12/16 0406 04/13/16 0532  04/14/16 0508 04/15/16 0339 04/16/16 0537  NA 146* 142 136 135 139  K 4.8 3.8 3.1* 3.0* 3.1*  CL 116* 109 105 104 107  CO2 24 23 22 22 22   GLUCOSE 114* 99 88 69 90  BUN 47* 38* 34* 35* 33*  CREATININE 2.28* 1.95* 1.92* 1.74* 1.80*  CALCIUM 12.9* 12.2* 11.3* 10.1 10.3   Liver Function Tests:  Recent Labs Lab 04/11/16 0417  AST 27  ALT 9*  ALKPHOS 116  BILITOT 0.6  PROT 7.7  ALBUMIN 3.2*   No results for input(s): LIPASE, AMYLASE in the last 168 hours. No results for input(s): AMMONIA in the last 168 hours. CBC:  Recent Labs Lab 04/10/16 0451 04/12/16 0406 04/15/16 0339  WBC 10.2 8.5 5.1  HGB 13.1 12.2 10.7*  HCT  39.8 38.7 31.9*  MCV 87.5 90.6 87.4  PLT 278 266 190   Cardiac Enzymes: No results for input(s): CKTOTAL, CKMB, CKMBINDEX, TROPONINI in the last 168 hours. BNP: BNP (last 3 results) No results for input(s): BNP in the last 8760 hours.  ProBNP (last 3 results) No results for input(s): PROBNP in the last 8760 hours.  CBG:  Recent Labs Lab 04/15/16 1229 04/15/16 1324 04/15/16 1804 04/15/16 2030 04/16/16 0905  GLUCAP 67 82 104* 80 94       Signed:  Annita Brod, MD [facility] 04/16/2016, 1:03 PM

## 2016-04-16 NOTE — Clinical Social Work Placement (Signed)
Medical Social Worker facilitated patient discharge including contacting patient family and facility to confirm patient discharge plans.  Clinical information faxed to facility and family agreeable with plan.  MSW arranged ambulance transport via [facility] to [place].  RN to call report prior to discharge.  Medical Social Worker will sign off for now as social work intervention is no longer needed. Please consult [place] again if new need arises.   CLINICAL SOCIAL WORK PLACEMENT  NOTE  Date:  04/16/2016  Patient Details  Name: Jill Allen MRN: [id number] Date of Birth: May 03, 1915  Clinical Social Work is seeking post-discharge placement for this patient at the [place] level of care (*CSW will initial, date and re-position this form in  chart as items are completed):  Yes   Patient/family provided with [place] Work Department's list of facilities offering this level of care within the geographic area requested by the patient (or if unable, by the patient's family).  Yes   Patient/family informed of their freedom to choose among providers that offer the needed level of care, that participate in Medicare, Medicaid or managed care program needed by the patient, have an available bed and are willing to accept the patient.  Yes   Patient/family informed of [facility]'s ownership interest in [facility] and [facility], as well as of the fact that they are under no obligation to receive care at these facilities.  PASRR submitted to EDS on 04/10/16     PASRR number received on 04/10/16     Existing PASRR number confirmed on       FL2 transmitted to all facilities in geographic area requested by pt/family on 04/10/16     FL2 transmitted to all facilities within larger geographic area on       Patient informed that his/her managed care company has contracts with or will negotiate with certain facilities, including the following:        Yes    Patient/family informed of bed offers received.  Patient chooses bed at [facility]     Physician recommends and patient chooses bed at      Patient to be transferred to [facility] on 04/16/16.  Patient to be transferred to facility by  [facility]y [facility] )     Patient family notified on 04/16/16 of transfer.  Name of family member notified:   (Pt's nephew, Elenore Rota)     PHYSICIAN Please prepare priority discharge summary, including medications     Additional Comment:    _______________________________________________ Glendon Axe A 04/16/2016, 4:11 PM

## 2016-04-16 NOTE — Progress Notes (Signed)
Report called to April at [facility].  Ambulance has been called for transport. Andre Lefort

## 2016-04-17 LAB — CALCIUM, IONIZED

## 2016-04-18 LAB — PTH-RELATED PEPTIDE: PTH-related peptide: <1.1 pmol/L

## 2016-04-23 DIAGNOSIS — I10 Essential (primary) hypertension: Secondary | ICD-10-CM | POA: Diagnosis not present

## 2016-04-23 DIAGNOSIS — E1122 Type 2 diabetes mellitus with diabetic chronic kidney disease: Secondary | ICD-10-CM | POA: Diagnosis not present

## 2016-04-23 DIAGNOSIS — M25531 Pain in right wrist: Secondary | ICD-10-CM | POA: Diagnosis not present

## 2016-04-25 DIAGNOSIS — I10 Essential (primary) hypertension: Secondary | ICD-10-CM | POA: Diagnosis not present

## 2016-04-25 DIAGNOSIS — E1122 Type 2 diabetes mellitus with diabetic chronic kidney disease: Secondary | ICD-10-CM | POA: Diagnosis not present

## 2016-04-25 DIAGNOSIS — R946 Abnormal results of thyroid function studies: Secondary | ICD-10-CM | POA: Diagnosis not present

## 2016-04-25 DIAGNOSIS — M25531 Pain in right wrist: Secondary | ICD-10-CM | POA: Diagnosis not present

## 2016-05-02 ENCOUNTER — Other Ambulatory Visit (HOSPITAL_COMMUNITY): Payer: Self-pay | Admitting: Internal Medicine

## 2016-05-02 DIAGNOSIS — R1310 Dysphagia, unspecified: Secondary | ICD-10-CM

## 2016-05-07 DIAGNOSIS — I10 Essential (primary) hypertension: Secondary | ICD-10-CM | POA: Diagnosis not present

## 2016-05-07 DIAGNOSIS — M25531 Pain in right wrist: Secondary | ICD-10-CM | POA: Diagnosis not present

## 2016-05-07 DIAGNOSIS — E1122 Type 2 diabetes mellitus with diabetic chronic kidney disease: Secondary | ICD-10-CM | POA: Diagnosis not present

## 2016-05-09 DIAGNOSIS — M25531 Pain in right wrist: Secondary | ICD-10-CM | POA: Diagnosis not present

## 2016-05-09 DIAGNOSIS — E1122 Type 2 diabetes mellitus with diabetic chronic kidney disease: Secondary | ICD-10-CM | POA: Diagnosis not present

## 2016-05-12 ENCOUNTER — Ambulatory Visit (HOSPITAL_COMMUNITY)
Admission: RE | Admit: 2016-05-12 | Discharge: 2016-05-12 | Disposition: A | Discharge status: Home / Self Care | Payer: No Typology Code available for payment source | Source: Ambulatory Visit | Attending: Internal Medicine | Admitting: Internal Medicine

## 2016-05-12 DIAGNOSIS — E119 Type 2 diabetes mellitus without complications: Secondary | ICD-10-CM | POA: Diagnosis not present

## 2016-05-12 DIAGNOSIS — N289 Disorder of kidney and ureter, unspecified: Secondary | ICD-10-CM | POA: Insufficient documentation

## 2016-05-12 DIAGNOSIS — R633 Feeding difficulties: Secondary | ICD-10-CM | POA: Diagnosis not present

## 2016-05-12 DIAGNOSIS — I10 Essential (primary) hypertension: Secondary | ICD-10-CM | POA: Insufficient documentation

## 2016-05-12 DIAGNOSIS — R1310 Dysphagia, unspecified: Secondary | ICD-10-CM | POA: Insufficient documentation

## 2016-05-12 DIAGNOSIS — M109 Gout, unspecified: Secondary | ICD-10-CM | POA: Diagnosis not present

## 2016-05-16 DIAGNOSIS — M25531 Pain in right wrist: Secondary | ICD-10-CM | POA: Diagnosis not present

## 2016-05-16 DIAGNOSIS — E1122 Type 2 diabetes mellitus with diabetic chronic kidney disease: Secondary | ICD-10-CM | POA: Diagnosis not present

## 2016-05-16 DIAGNOSIS — I10 Essential (primary) hypertension: Secondary | ICD-10-CM | POA: Diagnosis not present

## 2016-06-11 DIAGNOSIS — I10 Essential (primary) hypertension: Secondary | ICD-10-CM | POA: Diagnosis not present

## 2016-06-11 DIAGNOSIS — M1A09X0 Idiopathic chronic gout, multiple sites, without tophus (tophi): Secondary | ICD-10-CM | POA: Diagnosis not present

## 2016-06-11 DIAGNOSIS — E1122 Type 2 diabetes mellitus with diabetic chronic kidney disease: Secondary | ICD-10-CM | POA: Diagnosis not present

## 2016-06-13 DIAGNOSIS — Z7189 Other specified counseling: Secondary | ICD-10-CM | POA: Diagnosis not present

## 2016-06-13 DIAGNOSIS — M1A09X0 Idiopathic chronic gout, multiple sites, without tophus (tophi): Secondary | ICD-10-CM | POA: Diagnosis not present

## 2016-06-25 DIAGNOSIS — N390 Urinary tract infection, site not specified: Secondary | ICD-10-CM | POA: Diagnosis not present

## 2016-06-26 DIAGNOSIS — N183 Chronic kidney disease, stage 3 (moderate): Secondary | ICD-10-CM | POA: Diagnosis not present

## 2016-06-26 DIAGNOSIS — E11622 Type 2 diabetes mellitus with other skin ulcer: Secondary | ICD-10-CM | POA: Diagnosis not present

## 2016-07-22 DIAGNOSIS — N183 Chronic kidney disease, stage 3 (moderate): Secondary | ICD-10-CM | POA: Diagnosis not present

## 2016-07-22 DIAGNOSIS — N179 Acute kidney failure, unspecified: Secondary | ICD-10-CM | POA: Diagnosis not present

## 2016-07-22 DIAGNOSIS — R1313 Dysphagia, pharyngeal phase: Secondary | ICD-10-CM | POA: Diagnosis not present

## 2016-07-22 DIAGNOSIS — M6281 Muscle weakness (generalized): Secondary | ICD-10-CM | POA: Diagnosis not present

## 2016-07-22 DIAGNOSIS — R2689 Other abnormalities of gait and mobility: Secondary | ICD-10-CM | POA: Diagnosis not present

## 2016-07-22 DIAGNOSIS — E1129 Type 2 diabetes mellitus with other diabetic kidney complication: Secondary | ICD-10-CM | POA: Diagnosis not present

## 2016-07-23 DIAGNOSIS — R2689 Other abnormalities of gait and mobility: Secondary | ICD-10-CM | POA: Diagnosis not present

## 2016-07-23 DIAGNOSIS — N179 Acute kidney failure, unspecified: Secondary | ICD-10-CM | POA: Diagnosis not present

## 2016-07-23 DIAGNOSIS — R1313 Dysphagia, pharyngeal phase: Secondary | ICD-10-CM | POA: Diagnosis not present

## 2016-07-23 DIAGNOSIS — E1129 Type 2 diabetes mellitus with other diabetic kidney complication: Secondary | ICD-10-CM | POA: Diagnosis not present

## 2016-07-23 DIAGNOSIS — M6281 Muscle weakness (generalized): Secondary | ICD-10-CM | POA: Diagnosis not present

## 2016-07-23 DIAGNOSIS — N183 Chronic kidney disease, stage 3 (moderate): Secondary | ICD-10-CM | POA: Diagnosis not present

## 2016-07-24 DIAGNOSIS — N183 Chronic kidney disease, stage 3 (moderate): Secondary | ICD-10-CM | POA: Diagnosis not present

## 2016-07-24 DIAGNOSIS — M6281 Muscle weakness (generalized): Secondary | ICD-10-CM | POA: Diagnosis not present

## 2016-07-24 DIAGNOSIS — E1129 Type 2 diabetes mellitus with other diabetic kidney complication: Secondary | ICD-10-CM | POA: Diagnosis not present

## 2016-07-24 DIAGNOSIS — R1313 Dysphagia, pharyngeal phase: Secondary | ICD-10-CM | POA: Diagnosis not present

## 2016-07-24 DIAGNOSIS — N179 Acute kidney failure, unspecified: Secondary | ICD-10-CM | POA: Diagnosis not present

## 2016-07-24 DIAGNOSIS — R2689 Other abnormalities of gait and mobility: Secondary | ICD-10-CM | POA: Diagnosis not present

## 2016-07-25 DIAGNOSIS — E1129 Type 2 diabetes mellitus with other diabetic kidney complication: Secondary | ICD-10-CM | POA: Diagnosis not present

## 2016-07-25 DIAGNOSIS — R1313 Dysphagia, pharyngeal phase: Secondary | ICD-10-CM | POA: Diagnosis not present

## 2016-07-25 DIAGNOSIS — N179 Acute kidney failure, unspecified: Secondary | ICD-10-CM | POA: Diagnosis not present

## 2016-07-25 DIAGNOSIS — R2689 Other abnormalities of gait and mobility: Secondary | ICD-10-CM | POA: Diagnosis not present

## 2016-07-25 DIAGNOSIS — M1A09X0 Idiopathic chronic gout, multiple sites, without tophus (tophi): Secondary | ICD-10-CM | POA: Diagnosis not present

## 2016-07-25 DIAGNOSIS — E1122 Type 2 diabetes mellitus with diabetic chronic kidney disease: Secondary | ICD-10-CM | POA: Diagnosis not present

## 2016-07-25 DIAGNOSIS — M6281 Muscle weakness (generalized): Secondary | ICD-10-CM | POA: Diagnosis not present

## 2016-07-25 DIAGNOSIS — N183 Chronic kidney disease, stage 3 (moderate): Secondary | ICD-10-CM | POA: Diagnosis not present

## 2016-07-25 DIAGNOSIS — I10 Essential (primary) hypertension: Secondary | ICD-10-CM | POA: Diagnosis not present

## 2016-07-28 DIAGNOSIS — N179 Acute kidney failure, unspecified: Secondary | ICD-10-CM | POA: Diagnosis not present

## 2016-07-28 DIAGNOSIS — N183 Chronic kidney disease, stage 3 (moderate): Secondary | ICD-10-CM | POA: Diagnosis not present

## 2016-07-28 DIAGNOSIS — R1313 Dysphagia, pharyngeal phase: Secondary | ICD-10-CM | POA: Diagnosis not present

## 2016-07-28 DIAGNOSIS — E1129 Type 2 diabetes mellitus with other diabetic kidney complication: Secondary | ICD-10-CM | POA: Diagnosis not present

## 2016-07-28 DIAGNOSIS — R2689 Other abnormalities of gait and mobility: Secondary | ICD-10-CM | POA: Diagnosis not present

## 2016-07-28 DIAGNOSIS — M6281 Muscle weakness (generalized): Secondary | ICD-10-CM | POA: Diagnosis not present

## 2016-07-29 DIAGNOSIS — M6281 Muscle weakness (generalized): Secondary | ICD-10-CM | POA: Diagnosis not present

## 2016-07-29 DIAGNOSIS — N183 Chronic kidney disease, stage 3 (moderate): Secondary | ICD-10-CM | POA: Diagnosis not present

## 2016-07-29 DIAGNOSIS — R2689 Other abnormalities of gait and mobility: Secondary | ICD-10-CM | POA: Diagnosis not present

## 2016-07-29 DIAGNOSIS — N179 Acute kidney failure, unspecified: Secondary | ICD-10-CM | POA: Diagnosis not present

## 2016-07-29 DIAGNOSIS — E1129 Type 2 diabetes mellitus with other diabetic kidney complication: Secondary | ICD-10-CM | POA: Diagnosis not present

## 2016-07-29 DIAGNOSIS — R1313 Dysphagia, pharyngeal phase: Secondary | ICD-10-CM | POA: Diagnosis not present

## 2016-07-30 DIAGNOSIS — E1129 Type 2 diabetes mellitus with other diabetic kidney complication: Secondary | ICD-10-CM | POA: Diagnosis not present

## 2016-07-30 DIAGNOSIS — N179 Acute kidney failure, unspecified: Secondary | ICD-10-CM | POA: Diagnosis not present

## 2016-07-30 DIAGNOSIS — M6281 Muscle weakness (generalized): Secondary | ICD-10-CM | POA: Diagnosis not present

## 2016-07-30 DIAGNOSIS — R1313 Dysphagia, pharyngeal phase: Secondary | ICD-10-CM | POA: Diagnosis not present

## 2016-07-30 DIAGNOSIS — N183 Chronic kidney disease, stage 3 (moderate): Secondary | ICD-10-CM | POA: Diagnosis not present

## 2016-07-30 DIAGNOSIS — R2689 Other abnormalities of gait and mobility: Secondary | ICD-10-CM | POA: Diagnosis not present

## 2016-07-31 DIAGNOSIS — N179 Acute kidney failure, unspecified: Secondary | ICD-10-CM | POA: Diagnosis not present

## 2016-07-31 DIAGNOSIS — M6281 Muscle weakness (generalized): Secondary | ICD-10-CM | POA: Diagnosis not present

## 2016-07-31 DIAGNOSIS — E1129 Type 2 diabetes mellitus with other diabetic kidney complication: Secondary | ICD-10-CM | POA: Diagnosis not present

## 2016-07-31 DIAGNOSIS — R2689 Other abnormalities of gait and mobility: Secondary | ICD-10-CM | POA: Diagnosis not present

## 2016-07-31 DIAGNOSIS — N183 Chronic kidney disease, stage 3 (moderate): Secondary | ICD-10-CM | POA: Diagnosis not present

## 2016-07-31 DIAGNOSIS — R1313 Dysphagia, pharyngeal phase: Secondary | ICD-10-CM | POA: Diagnosis not present

## 2016-08-01 DIAGNOSIS — R1313 Dysphagia, pharyngeal phase: Secondary | ICD-10-CM | POA: Diagnosis not present

## 2016-08-01 DIAGNOSIS — M6281 Muscle weakness (generalized): Secondary | ICD-10-CM | POA: Diagnosis not present

## 2016-08-01 DIAGNOSIS — R2689 Other abnormalities of gait and mobility: Secondary | ICD-10-CM | POA: Diagnosis not present

## 2016-08-01 DIAGNOSIS — E1129 Type 2 diabetes mellitus with other diabetic kidney complication: Secondary | ICD-10-CM | POA: Diagnosis not present

## 2016-08-01 DIAGNOSIS — N183 Chronic kidney disease, stage 3 (moderate): Secondary | ICD-10-CM | POA: Diagnosis not present

## 2016-08-01 DIAGNOSIS — N179 Acute kidney failure, unspecified: Secondary | ICD-10-CM | POA: Diagnosis not present

## 2016-08-04 DIAGNOSIS — E1129 Type 2 diabetes mellitus with other diabetic kidney complication: Secondary | ICD-10-CM | POA: Diagnosis not present

## 2016-08-04 DIAGNOSIS — N183 Chronic kidney disease, stage 3 (moderate): Secondary | ICD-10-CM | POA: Diagnosis not present

## 2016-08-04 DIAGNOSIS — M6281 Muscle weakness (generalized): Secondary | ICD-10-CM | POA: Diagnosis not present

## 2016-08-04 DIAGNOSIS — R1313 Dysphagia, pharyngeal phase: Secondary | ICD-10-CM | POA: Diagnosis not present

## 2016-08-04 DIAGNOSIS — N179 Acute kidney failure, unspecified: Secondary | ICD-10-CM | POA: Diagnosis not present

## 2016-08-04 DIAGNOSIS — R2689 Other abnormalities of gait and mobility: Secondary | ICD-10-CM | POA: Diagnosis not present

## 2016-08-05 DIAGNOSIS — E1129 Type 2 diabetes mellitus with other diabetic kidney complication: Secondary | ICD-10-CM | POA: Diagnosis not present

## 2016-08-05 DIAGNOSIS — M6281 Muscle weakness (generalized): Secondary | ICD-10-CM | POA: Diagnosis not present

## 2016-08-05 DIAGNOSIS — R2689 Other abnormalities of gait and mobility: Secondary | ICD-10-CM | POA: Diagnosis not present

## 2016-08-05 DIAGNOSIS — N179 Acute kidney failure, unspecified: Secondary | ICD-10-CM | POA: Diagnosis not present

## 2016-08-05 DIAGNOSIS — R1313 Dysphagia, pharyngeal phase: Secondary | ICD-10-CM | POA: Diagnosis not present

## 2016-08-05 DIAGNOSIS — N183 Chronic kidney disease, stage 3 (moderate): Secondary | ICD-10-CM | POA: Diagnosis not present

## 2016-08-06 DIAGNOSIS — E1129 Type 2 diabetes mellitus with other diabetic kidney complication: Secondary | ICD-10-CM | POA: Diagnosis not present

## 2016-08-06 DIAGNOSIS — R2689 Other abnormalities of gait and mobility: Secondary | ICD-10-CM | POA: Diagnosis not present

## 2016-08-06 DIAGNOSIS — M6281 Muscle weakness (generalized): Secondary | ICD-10-CM | POA: Diagnosis not present

## 2016-08-06 DIAGNOSIS — N183 Chronic kidney disease, stage 3 (moderate): Secondary | ICD-10-CM | POA: Diagnosis not present

## 2016-08-06 DIAGNOSIS — R1313 Dysphagia, pharyngeal phase: Secondary | ICD-10-CM | POA: Diagnosis not present

## 2016-08-06 DIAGNOSIS — N179 Acute kidney failure, unspecified: Secondary | ICD-10-CM | POA: Diagnosis not present

## 2016-08-07 DIAGNOSIS — R1313 Dysphagia, pharyngeal phase: Secondary | ICD-10-CM | POA: Diagnosis not present

## 2016-08-07 DIAGNOSIS — N183 Chronic kidney disease, stage 3 (moderate): Secondary | ICD-10-CM | POA: Diagnosis not present

## 2016-08-07 DIAGNOSIS — N179 Acute kidney failure, unspecified: Secondary | ICD-10-CM | POA: Diagnosis not present

## 2016-08-07 DIAGNOSIS — R2689 Other abnormalities of gait and mobility: Secondary | ICD-10-CM | POA: Diagnosis not present

## 2016-08-07 DIAGNOSIS — M6281 Muscle weakness (generalized): Secondary | ICD-10-CM | POA: Diagnosis not present

## 2016-08-07 DIAGNOSIS — E1129 Type 2 diabetes mellitus with other diabetic kidney complication: Secondary | ICD-10-CM | POA: Diagnosis not present

## 2016-08-08 DIAGNOSIS — R1313 Dysphagia, pharyngeal phase: Secondary | ICD-10-CM | POA: Diagnosis not present

## 2016-08-08 DIAGNOSIS — N183 Chronic kidney disease, stage 3 (moderate): Secondary | ICD-10-CM | POA: Diagnosis not present

## 2016-08-08 DIAGNOSIS — N179 Acute kidney failure, unspecified: Secondary | ICD-10-CM | POA: Diagnosis not present

## 2016-08-08 DIAGNOSIS — R2689 Other abnormalities of gait and mobility: Secondary | ICD-10-CM | POA: Diagnosis not present

## 2016-08-08 DIAGNOSIS — M6281 Muscle weakness (generalized): Secondary | ICD-10-CM | POA: Diagnosis not present

## 2016-08-08 DIAGNOSIS — E1129 Type 2 diabetes mellitus with other diabetic kidney complication: Secondary | ICD-10-CM | POA: Diagnosis not present

## 2016-08-11 DIAGNOSIS — E1129 Type 2 diabetes mellitus with other diabetic kidney complication: Secondary | ICD-10-CM | POA: Diagnosis not present

## 2016-08-11 DIAGNOSIS — M6281 Muscle weakness (generalized): Secondary | ICD-10-CM | POA: Diagnosis not present

## 2016-08-11 DIAGNOSIS — N179 Acute kidney failure, unspecified: Secondary | ICD-10-CM | POA: Diagnosis not present

## 2016-08-11 DIAGNOSIS — N183 Chronic kidney disease, stage 3 (moderate): Secondary | ICD-10-CM | POA: Diagnosis not present

## 2016-08-11 DIAGNOSIS — R2689 Other abnormalities of gait and mobility: Secondary | ICD-10-CM | POA: Diagnosis not present

## 2016-08-11 DIAGNOSIS — R1313 Dysphagia, pharyngeal phase: Secondary | ICD-10-CM | POA: Diagnosis not present

## 2016-08-12 DIAGNOSIS — R2689 Other abnormalities of gait and mobility: Secondary | ICD-10-CM | POA: Diagnosis not present

## 2016-08-12 DIAGNOSIS — N183 Chronic kidney disease, stage 3 (moderate): Secondary | ICD-10-CM | POA: Diagnosis not present

## 2016-08-12 DIAGNOSIS — R1313 Dysphagia, pharyngeal phase: Secondary | ICD-10-CM | POA: Diagnosis not present

## 2016-08-12 DIAGNOSIS — E1129 Type 2 diabetes mellitus with other diabetic kidney complication: Secondary | ICD-10-CM | POA: Diagnosis not present

## 2016-08-12 DIAGNOSIS — N179 Acute kidney failure, unspecified: Secondary | ICD-10-CM | POA: Diagnosis not present

## 2016-08-12 DIAGNOSIS — M6281 Muscle weakness (generalized): Secondary | ICD-10-CM | POA: Diagnosis not present

## 2016-08-13 DIAGNOSIS — R2689 Other abnormalities of gait and mobility: Secondary | ICD-10-CM | POA: Diagnosis not present

## 2016-08-13 DIAGNOSIS — R1313 Dysphagia, pharyngeal phase: Secondary | ICD-10-CM | POA: Diagnosis not present

## 2016-08-13 DIAGNOSIS — E1129 Type 2 diabetes mellitus with other diabetic kidney complication: Secondary | ICD-10-CM | POA: Diagnosis not present

## 2016-08-13 DIAGNOSIS — N179 Acute kidney failure, unspecified: Secondary | ICD-10-CM | POA: Diagnosis not present

## 2016-08-13 DIAGNOSIS — M6281 Muscle weakness (generalized): Secondary | ICD-10-CM | POA: Diagnosis not present

## 2016-08-13 DIAGNOSIS — N183 Chronic kidney disease, stage 3 (moderate): Secondary | ICD-10-CM | POA: Diagnosis not present

## 2016-08-14 DIAGNOSIS — N183 Chronic kidney disease, stage 3 (moderate): Secondary | ICD-10-CM | POA: Diagnosis not present

## 2016-08-14 DIAGNOSIS — R1313 Dysphagia, pharyngeal phase: Secondary | ICD-10-CM | POA: Diagnosis not present

## 2016-08-14 DIAGNOSIS — M6281 Muscle weakness (generalized): Secondary | ICD-10-CM | POA: Diagnosis not present

## 2016-08-14 DIAGNOSIS — R2689 Other abnormalities of gait and mobility: Secondary | ICD-10-CM | POA: Diagnosis not present

## 2016-08-14 DIAGNOSIS — E1129 Type 2 diabetes mellitus with other diabetic kidney complication: Secondary | ICD-10-CM | POA: Diagnosis not present

## 2016-08-14 DIAGNOSIS — N179 Acute kidney failure, unspecified: Secondary | ICD-10-CM | POA: Diagnosis not present

## 2016-08-15 DIAGNOSIS — M6281 Muscle weakness (generalized): Secondary | ICD-10-CM | POA: Diagnosis not present

## 2016-08-15 DIAGNOSIS — N179 Acute kidney failure, unspecified: Secondary | ICD-10-CM | POA: Diagnosis not present

## 2016-08-15 DIAGNOSIS — N183 Chronic kidney disease, stage 3 (moderate): Secondary | ICD-10-CM | POA: Diagnosis not present

## 2016-08-15 DIAGNOSIS — E1129 Type 2 diabetes mellitus with other diabetic kidney complication: Secondary | ICD-10-CM | POA: Diagnosis not present

## 2016-08-15 DIAGNOSIS — R1313 Dysphagia, pharyngeal phase: Secondary | ICD-10-CM | POA: Diagnosis not present

## 2016-08-15 DIAGNOSIS — R2689 Other abnormalities of gait and mobility: Secondary | ICD-10-CM | POA: Diagnosis not present

## 2016-08-18 DIAGNOSIS — E1129 Type 2 diabetes mellitus with other diabetic kidney complication: Secondary | ICD-10-CM | POA: Diagnosis not present

## 2016-08-18 DIAGNOSIS — N183 Chronic kidney disease, stage 3 (moderate): Secondary | ICD-10-CM | POA: Diagnosis not present

## 2016-08-18 DIAGNOSIS — N179 Acute kidney failure, unspecified: Secondary | ICD-10-CM | POA: Diagnosis not present

## 2016-08-18 DIAGNOSIS — R2689 Other abnormalities of gait and mobility: Secondary | ICD-10-CM | POA: Diagnosis not present

## 2016-08-18 DIAGNOSIS — R1313 Dysphagia, pharyngeal phase: Secondary | ICD-10-CM | POA: Diagnosis not present

## 2016-08-18 DIAGNOSIS — M6281 Muscle weakness (generalized): Secondary | ICD-10-CM | POA: Diagnosis not present

## 2016-08-19 DIAGNOSIS — R2689 Other abnormalities of gait and mobility: Secondary | ICD-10-CM | POA: Diagnosis not present

## 2016-08-19 DIAGNOSIS — R1313 Dysphagia, pharyngeal phase: Secondary | ICD-10-CM | POA: Diagnosis not present

## 2016-08-19 DIAGNOSIS — N183 Chronic kidney disease, stage 3 (moderate): Secondary | ICD-10-CM | POA: Diagnosis not present

## 2016-08-19 DIAGNOSIS — E1129 Type 2 diabetes mellitus with other diabetic kidney complication: Secondary | ICD-10-CM | POA: Diagnosis not present

## 2016-08-19 DIAGNOSIS — M6281 Muscle weakness (generalized): Secondary | ICD-10-CM | POA: Diagnosis not present

## 2016-08-19 DIAGNOSIS — N179 Acute kidney failure, unspecified: Secondary | ICD-10-CM | POA: Diagnosis not present

## 2016-08-20 DIAGNOSIS — M6281 Muscle weakness (generalized): Secondary | ICD-10-CM | POA: Diagnosis not present

## 2016-08-20 DIAGNOSIS — E1129 Type 2 diabetes mellitus with other diabetic kidney complication: Secondary | ICD-10-CM | POA: Diagnosis not present

## 2016-08-20 DIAGNOSIS — N179 Acute kidney failure, unspecified: Secondary | ICD-10-CM | POA: Diagnosis not present

## 2016-08-20 DIAGNOSIS — R1313 Dysphagia, pharyngeal phase: Secondary | ICD-10-CM | POA: Diagnosis not present

## 2016-08-20 DIAGNOSIS — N183 Chronic kidney disease, stage 3 (moderate): Secondary | ICD-10-CM | POA: Diagnosis not present

## 2016-08-20 DIAGNOSIS — R2689 Other abnormalities of gait and mobility: Secondary | ICD-10-CM | POA: Diagnosis not present

## 2016-08-21 DIAGNOSIS — R1313 Dysphagia, pharyngeal phase: Secondary | ICD-10-CM | POA: Diagnosis not present

## 2016-08-21 DIAGNOSIS — E1129 Type 2 diabetes mellitus with other diabetic kidney complication: Secondary | ICD-10-CM | POA: Diagnosis not present

## 2016-08-21 DIAGNOSIS — N179 Acute kidney failure, unspecified: Secondary | ICD-10-CM | POA: Diagnosis not present

## 2016-08-21 DIAGNOSIS — N183 Chronic kidney disease, stage 3 (moderate): Secondary | ICD-10-CM | POA: Diagnosis not present

## 2016-08-21 DIAGNOSIS — R2689 Other abnormalities of gait and mobility: Secondary | ICD-10-CM | POA: Diagnosis not present

## 2016-08-21 DIAGNOSIS — M6281 Muscle weakness (generalized): Secondary | ICD-10-CM | POA: Diagnosis not present

## 2016-08-22 DIAGNOSIS — R1313 Dysphagia, pharyngeal phase: Secondary | ICD-10-CM | POA: Diagnosis not present

## 2016-08-22 DIAGNOSIS — E1129 Type 2 diabetes mellitus with other diabetic kidney complication: Secondary | ICD-10-CM | POA: Diagnosis not present

## 2016-08-22 DIAGNOSIS — N183 Chronic kidney disease, stage 3 (moderate): Secondary | ICD-10-CM | POA: Diagnosis not present

## 2016-08-22 DIAGNOSIS — N179 Acute kidney failure, unspecified: Secondary | ICD-10-CM | POA: Diagnosis not present

## 2016-08-22 DIAGNOSIS — R2689 Other abnormalities of gait and mobility: Secondary | ICD-10-CM | POA: Diagnosis not present

## 2016-08-22 DIAGNOSIS — M6281 Muscle weakness (generalized): Secondary | ICD-10-CM | POA: Diagnosis not present

## 2016-08-24 DIAGNOSIS — N183 Chronic kidney disease, stage 3 (moderate): Secondary | ICD-10-CM | POA: Diagnosis not present

## 2016-08-24 DIAGNOSIS — R1313 Dysphagia, pharyngeal phase: Secondary | ICD-10-CM | POA: Diagnosis not present

## 2016-08-24 DIAGNOSIS — E1129 Type 2 diabetes mellitus with other diabetic kidney complication: Secondary | ICD-10-CM | POA: Diagnosis not present

## 2016-08-24 DIAGNOSIS — N179 Acute kidney failure, unspecified: Secondary | ICD-10-CM | POA: Diagnosis not present

## 2016-08-24 DIAGNOSIS — R2689 Other abnormalities of gait and mobility: Secondary | ICD-10-CM | POA: Diagnosis not present

## 2016-08-24 DIAGNOSIS — M6281 Muscle weakness (generalized): Secondary | ICD-10-CM | POA: Diagnosis not present

## 2016-08-25 DIAGNOSIS — M6281 Muscle weakness (generalized): Secondary | ICD-10-CM | POA: Diagnosis not present

## 2016-08-25 DIAGNOSIS — R2689 Other abnormalities of gait and mobility: Secondary | ICD-10-CM | POA: Diagnosis not present

## 2016-08-25 DIAGNOSIS — N183 Chronic kidney disease, stage 3 (moderate): Secondary | ICD-10-CM | POA: Diagnosis not present

## 2016-08-25 DIAGNOSIS — R1313 Dysphagia, pharyngeal phase: Secondary | ICD-10-CM | POA: Diagnosis not present

## 2016-08-25 DIAGNOSIS — E1129 Type 2 diabetes mellitus with other diabetic kidney complication: Secondary | ICD-10-CM | POA: Diagnosis not present

## 2016-08-25 DIAGNOSIS — N179 Acute kidney failure, unspecified: Secondary | ICD-10-CM | POA: Diagnosis not present

## 2016-08-26 DIAGNOSIS — R2689 Other abnormalities of gait and mobility: Secondary | ICD-10-CM | POA: Diagnosis not present

## 2016-08-26 DIAGNOSIS — N183 Chronic kidney disease, stage 3 (moderate): Secondary | ICD-10-CM | POA: Diagnosis not present

## 2016-08-26 DIAGNOSIS — M6281 Muscle weakness (generalized): Secondary | ICD-10-CM | POA: Diagnosis not present

## 2016-08-26 DIAGNOSIS — N179 Acute kidney failure, unspecified: Secondary | ICD-10-CM | POA: Diagnosis not present

## 2016-08-26 DIAGNOSIS — E1129 Type 2 diabetes mellitus with other diabetic kidney complication: Secondary | ICD-10-CM | POA: Diagnosis not present

## 2016-08-26 DIAGNOSIS — R1313 Dysphagia, pharyngeal phase: Secondary | ICD-10-CM | POA: Diagnosis not present

## 2016-08-28 DIAGNOSIS — R1313 Dysphagia, pharyngeal phase: Secondary | ICD-10-CM | POA: Diagnosis not present

## 2016-08-28 DIAGNOSIS — N179 Acute kidney failure, unspecified: Secondary | ICD-10-CM | POA: Diagnosis not present

## 2016-08-28 DIAGNOSIS — R2689 Other abnormalities of gait and mobility: Secondary | ICD-10-CM | POA: Diagnosis not present

## 2016-08-28 DIAGNOSIS — N183 Chronic kidney disease, stage 3 (moderate): Secondary | ICD-10-CM | POA: Diagnosis not present

## 2016-08-28 DIAGNOSIS — E1129 Type 2 diabetes mellitus with other diabetic kidney complication: Secondary | ICD-10-CM | POA: Diagnosis not present

## 2016-08-28 DIAGNOSIS — M6281 Muscle weakness (generalized): Secondary | ICD-10-CM | POA: Diagnosis not present

## 2016-08-29 DIAGNOSIS — N183 Chronic kidney disease, stage 3 (moderate): Secondary | ICD-10-CM | POA: Diagnosis not present

## 2016-08-29 DIAGNOSIS — N179 Acute kidney failure, unspecified: Secondary | ICD-10-CM | POA: Diagnosis not present

## 2016-08-29 DIAGNOSIS — R1313 Dysphagia, pharyngeal phase: Secondary | ICD-10-CM | POA: Diagnosis not present

## 2016-08-29 DIAGNOSIS — M6281 Muscle weakness (generalized): Secondary | ICD-10-CM | POA: Diagnosis not present

## 2016-08-29 DIAGNOSIS — R2689 Other abnormalities of gait and mobility: Secondary | ICD-10-CM | POA: Diagnosis not present

## 2016-08-29 DIAGNOSIS — E1129 Type 2 diabetes mellitus with other diabetic kidney complication: Secondary | ICD-10-CM | POA: Diagnosis not present

## 2016-09-01 DIAGNOSIS — R1313 Dysphagia, pharyngeal phase: Secondary | ICD-10-CM | POA: Diagnosis not present

## 2016-09-01 DIAGNOSIS — M6281 Muscle weakness (generalized): Secondary | ICD-10-CM | POA: Diagnosis not present

## 2016-09-01 DIAGNOSIS — N183 Chronic kidney disease, stage 3 (moderate): Secondary | ICD-10-CM | POA: Diagnosis not present

## 2016-09-01 DIAGNOSIS — R2689 Other abnormalities of gait and mobility: Secondary | ICD-10-CM | POA: Diagnosis not present

## 2016-09-01 DIAGNOSIS — E1129 Type 2 diabetes mellitus with other diabetic kidney complication: Secondary | ICD-10-CM | POA: Diagnosis not present

## 2016-09-01 DIAGNOSIS — N179 Acute kidney failure, unspecified: Secondary | ICD-10-CM | POA: Diagnosis not present

## 2016-09-02 DIAGNOSIS — E1129 Type 2 diabetes mellitus with other diabetic kidney complication: Secondary | ICD-10-CM | POA: Diagnosis not present

## 2016-09-02 DIAGNOSIS — M6281 Muscle weakness (generalized): Secondary | ICD-10-CM | POA: Diagnosis not present

## 2016-09-02 DIAGNOSIS — R1313 Dysphagia, pharyngeal phase: Secondary | ICD-10-CM | POA: Diagnosis not present

## 2016-09-02 DIAGNOSIS — N179 Acute kidney failure, unspecified: Secondary | ICD-10-CM | POA: Diagnosis not present

## 2016-09-02 DIAGNOSIS — R2689 Other abnormalities of gait and mobility: Secondary | ICD-10-CM | POA: Diagnosis not present

## 2016-09-02 DIAGNOSIS — N183 Chronic kidney disease, stage 3 (moderate): Secondary | ICD-10-CM | POA: Diagnosis not present

## 2016-09-03 DIAGNOSIS — R1313 Dysphagia, pharyngeal phase: Secondary | ICD-10-CM | POA: Diagnosis not present

## 2016-09-03 DIAGNOSIS — R2689 Other abnormalities of gait and mobility: Secondary | ICD-10-CM | POA: Diagnosis not present

## 2016-09-03 DIAGNOSIS — N179 Acute kidney failure, unspecified: Secondary | ICD-10-CM | POA: Diagnosis not present

## 2016-09-03 DIAGNOSIS — N183 Chronic kidney disease, stage 3 (moderate): Secondary | ICD-10-CM | POA: Diagnosis not present

## 2016-09-03 DIAGNOSIS — M6281 Muscle weakness (generalized): Secondary | ICD-10-CM | POA: Diagnosis not present

## 2016-09-03 DIAGNOSIS — E1129 Type 2 diabetes mellitus with other diabetic kidney complication: Secondary | ICD-10-CM | POA: Diagnosis not present

## 2016-09-04 DIAGNOSIS — N183 Chronic kidney disease, stage 3 (moderate): Secondary | ICD-10-CM | POA: Diagnosis not present

## 2016-09-04 DIAGNOSIS — R2689 Other abnormalities of gait and mobility: Secondary | ICD-10-CM | POA: Diagnosis not present

## 2016-09-04 DIAGNOSIS — E1129 Type 2 diabetes mellitus with other diabetic kidney complication: Secondary | ICD-10-CM | POA: Diagnosis not present

## 2016-09-04 DIAGNOSIS — M6281 Muscle weakness (generalized): Secondary | ICD-10-CM | POA: Diagnosis not present

## 2016-09-04 DIAGNOSIS — N179 Acute kidney failure, unspecified: Secondary | ICD-10-CM | POA: Diagnosis not present

## 2016-09-04 DIAGNOSIS — R1313 Dysphagia, pharyngeal phase: Secondary | ICD-10-CM | POA: Diagnosis not present

## 2016-09-05 DIAGNOSIS — R2689 Other abnormalities of gait and mobility: Secondary | ICD-10-CM | POA: Diagnosis not present

## 2016-09-05 DIAGNOSIS — N179 Acute kidney failure, unspecified: Secondary | ICD-10-CM | POA: Diagnosis not present

## 2016-09-05 DIAGNOSIS — E1129 Type 2 diabetes mellitus with other diabetic kidney complication: Secondary | ICD-10-CM | POA: Diagnosis not present

## 2016-09-05 DIAGNOSIS — R1313 Dysphagia, pharyngeal phase: Secondary | ICD-10-CM | POA: Diagnosis not present

## 2016-09-05 DIAGNOSIS — M6281 Muscle weakness (generalized): Secondary | ICD-10-CM | POA: Diagnosis not present

## 2016-09-05 DIAGNOSIS — N183 Chronic kidney disease, stage 3 (moderate): Secondary | ICD-10-CM | POA: Diagnosis not present

## 2016-09-07 DIAGNOSIS — E1129 Type 2 diabetes mellitus with other diabetic kidney complication: Secondary | ICD-10-CM | POA: Diagnosis not present

## 2016-09-07 DIAGNOSIS — R1313 Dysphagia, pharyngeal phase: Secondary | ICD-10-CM | POA: Diagnosis not present

## 2016-09-07 DIAGNOSIS — N179 Acute kidney failure, unspecified: Secondary | ICD-10-CM | POA: Diagnosis not present

## 2016-09-07 DIAGNOSIS — N183 Chronic kidney disease, stage 3 (moderate): Secondary | ICD-10-CM | POA: Diagnosis not present

## 2016-09-07 DIAGNOSIS — R2689 Other abnormalities of gait and mobility: Secondary | ICD-10-CM | POA: Diagnosis not present

## 2016-09-07 DIAGNOSIS — M6281 Muscle weakness (generalized): Secondary | ICD-10-CM | POA: Diagnosis not present

## 2016-09-08 DIAGNOSIS — M10019 Idiopathic gout, unspecified shoulder: Secondary | ICD-10-CM | POA: Diagnosis not present

## 2016-09-09 DIAGNOSIS — R2689 Other abnormalities of gait and mobility: Secondary | ICD-10-CM | POA: Diagnosis not present

## 2016-09-09 DIAGNOSIS — R1313 Dysphagia, pharyngeal phase: Secondary | ICD-10-CM | POA: Diagnosis not present

## 2016-09-09 DIAGNOSIS — M6281 Muscle weakness (generalized): Secondary | ICD-10-CM | POA: Diagnosis not present

## 2016-09-09 DIAGNOSIS — N179 Acute kidney failure, unspecified: Secondary | ICD-10-CM | POA: Diagnosis not present

## 2016-09-09 DIAGNOSIS — N183 Chronic kidney disease, stage 3 (moderate): Secondary | ICD-10-CM | POA: Diagnosis not present

## 2016-09-09 DIAGNOSIS — E1129 Type 2 diabetes mellitus with other diabetic kidney complication: Secondary | ICD-10-CM | POA: Diagnosis not present

## 2016-09-10 DIAGNOSIS — R1313 Dysphagia, pharyngeal phase: Secondary | ICD-10-CM | POA: Diagnosis not present

## 2016-09-10 DIAGNOSIS — M6281 Muscle weakness (generalized): Secondary | ICD-10-CM | POA: Diagnosis not present

## 2016-09-10 DIAGNOSIS — R2689 Other abnormalities of gait and mobility: Secondary | ICD-10-CM | POA: Diagnosis not present

## 2016-09-10 DIAGNOSIS — N179 Acute kidney failure, unspecified: Secondary | ICD-10-CM | POA: Diagnosis not present

## 2016-09-10 DIAGNOSIS — N183 Chronic kidney disease, stage 3 (moderate): Secondary | ICD-10-CM | POA: Diagnosis not present

## 2016-09-10 DIAGNOSIS — E1129 Type 2 diabetes mellitus with other diabetic kidney complication: Secondary | ICD-10-CM | POA: Diagnosis not present

## 2016-09-11 DIAGNOSIS — M6281 Muscle weakness (generalized): Secondary | ICD-10-CM | POA: Diagnosis not present

## 2016-09-11 DIAGNOSIS — N179 Acute kidney failure, unspecified: Secondary | ICD-10-CM | POA: Diagnosis not present

## 2016-09-11 DIAGNOSIS — R2689 Other abnormalities of gait and mobility: Secondary | ICD-10-CM | POA: Diagnosis not present

## 2016-09-11 DIAGNOSIS — N183 Chronic kidney disease, stage 3 (moderate): Secondary | ICD-10-CM | POA: Diagnosis not present

## 2016-09-11 DIAGNOSIS — R1313 Dysphagia, pharyngeal phase: Secondary | ICD-10-CM | POA: Diagnosis not present

## 2016-09-11 DIAGNOSIS — E1129 Type 2 diabetes mellitus with other diabetic kidney complication: Secondary | ICD-10-CM | POA: Diagnosis not present

## 2016-09-12 DIAGNOSIS — R1313 Dysphagia, pharyngeal phase: Secondary | ICD-10-CM | POA: Diagnosis not present

## 2016-09-12 DIAGNOSIS — E1129 Type 2 diabetes mellitus with other diabetic kidney complication: Secondary | ICD-10-CM | POA: Diagnosis not present

## 2016-09-12 DIAGNOSIS — R2689 Other abnormalities of gait and mobility: Secondary | ICD-10-CM | POA: Diagnosis not present

## 2016-09-12 DIAGNOSIS — R41841 Cognitive communication deficit: Secondary | ICD-10-CM | POA: Diagnosis not present

## 2016-09-12 DIAGNOSIS — N179 Acute kidney failure, unspecified: Secondary | ICD-10-CM | POA: Diagnosis not present

## 2016-09-12 DIAGNOSIS — M6281 Muscle weakness (generalized): Secondary | ICD-10-CM | POA: Diagnosis not present

## 2016-09-12 DIAGNOSIS — N183 Chronic kidney disease, stage 3 (moderate): Secondary | ICD-10-CM | POA: Diagnosis not present

## 2016-09-13 DIAGNOSIS — M6281 Muscle weakness (generalized): Secondary | ICD-10-CM | POA: Diagnosis not present

## 2016-09-13 DIAGNOSIS — E1129 Type 2 diabetes mellitus with other diabetic kidney complication: Secondary | ICD-10-CM | POA: Diagnosis not present

## 2016-09-13 DIAGNOSIS — R2689 Other abnormalities of gait and mobility: Secondary | ICD-10-CM | POA: Diagnosis not present

## 2016-09-13 DIAGNOSIS — N179 Acute kidney failure, unspecified: Secondary | ICD-10-CM | POA: Diagnosis not present

## 2016-09-13 DIAGNOSIS — R1313 Dysphagia, pharyngeal phase: Secondary | ICD-10-CM | POA: Diagnosis not present

## 2016-09-13 DIAGNOSIS — N183 Chronic kidney disease, stage 3 (moderate): Secondary | ICD-10-CM | POA: Diagnosis not present

## 2016-09-15 DIAGNOSIS — E1129 Type 2 diabetes mellitus with other diabetic kidney complication: Secondary | ICD-10-CM | POA: Diagnosis not present

## 2016-09-15 DIAGNOSIS — R1313 Dysphagia, pharyngeal phase: Secondary | ICD-10-CM | POA: Diagnosis not present

## 2016-09-15 DIAGNOSIS — N183 Chronic kidney disease, stage 3 (moderate): Secondary | ICD-10-CM | POA: Diagnosis not present

## 2016-09-15 DIAGNOSIS — N179 Acute kidney failure, unspecified: Secondary | ICD-10-CM | POA: Diagnosis not present

## 2016-09-15 DIAGNOSIS — R2689 Other abnormalities of gait and mobility: Secondary | ICD-10-CM | POA: Diagnosis not present

## 2016-09-15 DIAGNOSIS — M6281 Muscle weakness (generalized): Secondary | ICD-10-CM | POA: Diagnosis not present

## 2016-09-16 DIAGNOSIS — M6281 Muscle weakness (generalized): Secondary | ICD-10-CM | POA: Diagnosis not present

## 2016-09-16 DIAGNOSIS — E1129 Type 2 diabetes mellitus with other diabetic kidney complication: Secondary | ICD-10-CM | POA: Diagnosis not present

## 2016-09-16 DIAGNOSIS — R1313 Dysphagia, pharyngeal phase: Secondary | ICD-10-CM | POA: Diagnosis not present

## 2016-09-16 DIAGNOSIS — N183 Chronic kidney disease, stage 3 (moderate): Secondary | ICD-10-CM | POA: Diagnosis not present

## 2016-09-16 DIAGNOSIS — N179 Acute kidney failure, unspecified: Secondary | ICD-10-CM | POA: Diagnosis not present

## 2016-09-16 DIAGNOSIS — R2689 Other abnormalities of gait and mobility: Secondary | ICD-10-CM | POA: Diagnosis not present

## 2016-09-17 DIAGNOSIS — E1129 Type 2 diabetes mellitus with other diabetic kidney complication: Secondary | ICD-10-CM | POA: Diagnosis not present

## 2016-09-17 DIAGNOSIS — M6281 Muscle weakness (generalized): Secondary | ICD-10-CM | POA: Diagnosis not present

## 2016-09-17 DIAGNOSIS — N183 Chronic kidney disease, stage 3 (moderate): Secondary | ICD-10-CM | POA: Diagnosis not present

## 2016-09-17 DIAGNOSIS — R1313 Dysphagia, pharyngeal phase: Secondary | ICD-10-CM | POA: Diagnosis not present

## 2016-09-17 DIAGNOSIS — I10 Essential (primary) hypertension: Secondary | ICD-10-CM | POA: Diagnosis not present

## 2016-09-17 DIAGNOSIS — R2689 Other abnormalities of gait and mobility: Secondary | ICD-10-CM | POA: Diagnosis not present

## 2016-09-17 DIAGNOSIS — N179 Acute kidney failure, unspecified: Secondary | ICD-10-CM | POA: Diagnosis not present

## 2016-09-17 DIAGNOSIS — E1122 Type 2 diabetes mellitus with diabetic chronic kidney disease: Secondary | ICD-10-CM | POA: Diagnosis not present

## 2016-09-18 DIAGNOSIS — R2689 Other abnormalities of gait and mobility: Secondary | ICD-10-CM | POA: Diagnosis not present

## 2016-09-18 DIAGNOSIS — M6281 Muscle weakness (generalized): Secondary | ICD-10-CM | POA: Diagnosis not present

## 2016-09-18 DIAGNOSIS — R1313 Dysphagia, pharyngeal phase: Secondary | ICD-10-CM | POA: Diagnosis not present

## 2016-09-18 DIAGNOSIS — E1129 Type 2 diabetes mellitus with other diabetic kidney complication: Secondary | ICD-10-CM | POA: Diagnosis not present

## 2016-09-18 DIAGNOSIS — N179 Acute kidney failure, unspecified: Secondary | ICD-10-CM | POA: Diagnosis not present

## 2016-09-18 DIAGNOSIS — N183 Chronic kidney disease, stage 3 (moderate): Secondary | ICD-10-CM | POA: Diagnosis not present

## 2016-09-19 DIAGNOSIS — E1129 Type 2 diabetes mellitus with other diabetic kidney complication: Secondary | ICD-10-CM | POA: Diagnosis not present

## 2016-09-19 DIAGNOSIS — N183 Chronic kidney disease, stage 3 (moderate): Secondary | ICD-10-CM | POA: Diagnosis not present

## 2016-09-19 DIAGNOSIS — R1313 Dysphagia, pharyngeal phase: Secondary | ICD-10-CM | POA: Diagnosis not present

## 2016-09-19 DIAGNOSIS — M6281 Muscle weakness (generalized): Secondary | ICD-10-CM | POA: Diagnosis not present

## 2016-09-19 DIAGNOSIS — N179 Acute kidney failure, unspecified: Secondary | ICD-10-CM | POA: Diagnosis not present

## 2016-09-19 DIAGNOSIS — R2689 Other abnormalities of gait and mobility: Secondary | ICD-10-CM | POA: Diagnosis not present

## 2016-09-20 DIAGNOSIS — R1313 Dysphagia, pharyngeal phase: Secondary | ICD-10-CM | POA: Diagnosis not present

## 2016-09-20 DIAGNOSIS — M6281 Muscle weakness (generalized): Secondary | ICD-10-CM | POA: Diagnosis not present

## 2016-09-20 DIAGNOSIS — N183 Chronic kidney disease, stage 3 (moderate): Secondary | ICD-10-CM | POA: Diagnosis not present

## 2016-09-20 DIAGNOSIS — R2689 Other abnormalities of gait and mobility: Secondary | ICD-10-CM | POA: Diagnosis not present

## 2016-09-20 DIAGNOSIS — E1129 Type 2 diabetes mellitus with other diabetic kidney complication: Secondary | ICD-10-CM | POA: Diagnosis not present

## 2016-09-20 DIAGNOSIS — N179 Acute kidney failure, unspecified: Secondary | ICD-10-CM | POA: Diagnosis not present

## 2016-09-22 DIAGNOSIS — N179 Acute kidney failure, unspecified: Secondary | ICD-10-CM | POA: Diagnosis not present

## 2016-09-22 DIAGNOSIS — R1313 Dysphagia, pharyngeal phase: Secondary | ICD-10-CM | POA: Diagnosis not present

## 2016-09-22 DIAGNOSIS — E1129 Type 2 diabetes mellitus with other diabetic kidney complication: Secondary | ICD-10-CM | POA: Diagnosis not present

## 2016-09-22 DIAGNOSIS — R2689 Other abnormalities of gait and mobility: Secondary | ICD-10-CM | POA: Diagnosis not present

## 2016-09-22 DIAGNOSIS — M6281 Muscle weakness (generalized): Secondary | ICD-10-CM | POA: Diagnosis not present

## 2016-09-22 DIAGNOSIS — N183 Chronic kidney disease, stage 3 (moderate): Secondary | ICD-10-CM | POA: Diagnosis not present

## 2016-09-23 DIAGNOSIS — N179 Acute kidney failure, unspecified: Secondary | ICD-10-CM | POA: Diagnosis not present

## 2016-09-23 DIAGNOSIS — N183 Chronic kidney disease, stage 3 (moderate): Secondary | ICD-10-CM | POA: Diagnosis not present

## 2016-09-23 DIAGNOSIS — E1129 Type 2 diabetes mellitus with other diabetic kidney complication: Secondary | ICD-10-CM | POA: Diagnosis not present

## 2016-09-23 DIAGNOSIS — M6281 Muscle weakness (generalized): Secondary | ICD-10-CM | POA: Diagnosis not present

## 2016-09-23 DIAGNOSIS — R2689 Other abnormalities of gait and mobility: Secondary | ICD-10-CM | POA: Diagnosis not present

## 2016-09-23 DIAGNOSIS — R1313 Dysphagia, pharyngeal phase: Secondary | ICD-10-CM | POA: Diagnosis not present

## 2016-09-24 DIAGNOSIS — N183 Chronic kidney disease, stage 3 (moderate): Secondary | ICD-10-CM | POA: Diagnosis not present

## 2016-09-24 DIAGNOSIS — R1313 Dysphagia, pharyngeal phase: Secondary | ICD-10-CM | POA: Diagnosis not present

## 2016-09-24 DIAGNOSIS — R2689 Other abnormalities of gait and mobility: Secondary | ICD-10-CM | POA: Diagnosis not present

## 2016-09-24 DIAGNOSIS — E1129 Type 2 diabetes mellitus with other diabetic kidney complication: Secondary | ICD-10-CM | POA: Diagnosis not present

## 2016-09-24 DIAGNOSIS — N179 Acute kidney failure, unspecified: Secondary | ICD-10-CM | POA: Diagnosis not present

## 2016-09-24 DIAGNOSIS — M6281 Muscle weakness (generalized): Secondary | ICD-10-CM | POA: Diagnosis not present

## 2016-09-25 DIAGNOSIS — R2689 Other abnormalities of gait and mobility: Secondary | ICD-10-CM | POA: Diagnosis not present

## 2016-09-25 DIAGNOSIS — M6281 Muscle weakness (generalized): Secondary | ICD-10-CM | POA: Diagnosis not present

## 2016-09-25 DIAGNOSIS — E1129 Type 2 diabetes mellitus with other diabetic kidney complication: Secondary | ICD-10-CM | POA: Diagnosis not present

## 2016-09-25 DIAGNOSIS — N179 Acute kidney failure, unspecified: Secondary | ICD-10-CM | POA: Diagnosis not present

## 2016-09-25 DIAGNOSIS — R1313 Dysphagia, pharyngeal phase: Secondary | ICD-10-CM | POA: Diagnosis not present

## 2016-09-25 DIAGNOSIS — N183 Chronic kidney disease, stage 3 (moderate): Secondary | ICD-10-CM | POA: Diagnosis not present

## 2016-09-26 DIAGNOSIS — R1313 Dysphagia, pharyngeal phase: Secondary | ICD-10-CM | POA: Diagnosis not present

## 2016-09-26 DIAGNOSIS — N183 Chronic kidney disease, stage 3 (moderate): Secondary | ICD-10-CM | POA: Diagnosis not present

## 2016-09-26 DIAGNOSIS — E1129 Type 2 diabetes mellitus with other diabetic kidney complication: Secondary | ICD-10-CM | POA: Diagnosis not present

## 2016-09-26 DIAGNOSIS — M6281 Muscle weakness (generalized): Secondary | ICD-10-CM | POA: Diagnosis not present

## 2016-09-26 DIAGNOSIS — N179 Acute kidney failure, unspecified: Secondary | ICD-10-CM | POA: Diagnosis not present

## 2016-09-26 DIAGNOSIS — R2689 Other abnormalities of gait and mobility: Secondary | ICD-10-CM | POA: Diagnosis not present

## 2016-09-28 DIAGNOSIS — N183 Chronic kidney disease, stage 3 (moderate): Secondary | ICD-10-CM | POA: Diagnosis not present

## 2016-09-28 DIAGNOSIS — M6281 Muscle weakness (generalized): Secondary | ICD-10-CM | POA: Diagnosis not present

## 2016-09-28 DIAGNOSIS — R1313 Dysphagia, pharyngeal phase: Secondary | ICD-10-CM | POA: Diagnosis not present

## 2016-09-28 DIAGNOSIS — N179 Acute kidney failure, unspecified: Secondary | ICD-10-CM | POA: Diagnosis not present

## 2016-09-28 DIAGNOSIS — R2689 Other abnormalities of gait and mobility: Secondary | ICD-10-CM | POA: Diagnosis not present

## 2016-09-28 DIAGNOSIS — E1129 Type 2 diabetes mellitus with other diabetic kidney complication: Secondary | ICD-10-CM | POA: Diagnosis not present

## 2016-09-29 DIAGNOSIS — R1313 Dysphagia, pharyngeal phase: Secondary | ICD-10-CM | POA: Diagnosis not present

## 2016-09-29 DIAGNOSIS — M6281 Muscle weakness (generalized): Secondary | ICD-10-CM | POA: Diagnosis not present

## 2016-09-29 DIAGNOSIS — R2689 Other abnormalities of gait and mobility: Secondary | ICD-10-CM | POA: Diagnosis not present

## 2016-09-29 DIAGNOSIS — E1129 Type 2 diabetes mellitus with other diabetic kidney complication: Secondary | ICD-10-CM | POA: Diagnosis not present

## 2016-09-29 DIAGNOSIS — N179 Acute kidney failure, unspecified: Secondary | ICD-10-CM | POA: Diagnosis not present

## 2016-09-29 DIAGNOSIS — N183 Chronic kidney disease, stage 3 (moderate): Secondary | ICD-10-CM | POA: Diagnosis not present

## 2016-09-30 DIAGNOSIS — R2689 Other abnormalities of gait and mobility: Secondary | ICD-10-CM | POA: Diagnosis not present

## 2016-09-30 DIAGNOSIS — N183 Chronic kidney disease, stage 3 (moderate): Secondary | ICD-10-CM | POA: Diagnosis not present

## 2016-09-30 DIAGNOSIS — R1313 Dysphagia, pharyngeal phase: Secondary | ICD-10-CM | POA: Diagnosis not present

## 2016-09-30 DIAGNOSIS — E1129 Type 2 diabetes mellitus with other diabetic kidney complication: Secondary | ICD-10-CM | POA: Diagnosis not present

## 2016-09-30 DIAGNOSIS — N179 Acute kidney failure, unspecified: Secondary | ICD-10-CM | POA: Diagnosis not present

## 2016-09-30 DIAGNOSIS — M6281 Muscle weakness (generalized): Secondary | ICD-10-CM | POA: Diagnosis not present

## 2016-10-01 DIAGNOSIS — N179 Acute kidney failure, unspecified: Secondary | ICD-10-CM | POA: Diagnosis not present

## 2016-10-01 DIAGNOSIS — E1129 Type 2 diabetes mellitus with other diabetic kidney complication: Secondary | ICD-10-CM | POA: Diagnosis not present

## 2016-10-01 DIAGNOSIS — N183 Chronic kidney disease, stage 3 (moderate): Secondary | ICD-10-CM | POA: Diagnosis not present

## 2016-10-01 DIAGNOSIS — M6281 Muscle weakness (generalized): Secondary | ICD-10-CM | POA: Diagnosis not present

## 2016-10-01 DIAGNOSIS — R2689 Other abnormalities of gait and mobility: Secondary | ICD-10-CM | POA: Diagnosis not present

## 2016-10-01 DIAGNOSIS — R1313 Dysphagia, pharyngeal phase: Secondary | ICD-10-CM | POA: Diagnosis not present

## 2016-10-02 DIAGNOSIS — R2689 Other abnormalities of gait and mobility: Secondary | ICD-10-CM | POA: Diagnosis not present

## 2016-10-02 DIAGNOSIS — R1313 Dysphagia, pharyngeal phase: Secondary | ICD-10-CM | POA: Diagnosis not present

## 2016-10-02 DIAGNOSIS — N183 Chronic kidney disease, stage 3 (moderate): Secondary | ICD-10-CM | POA: Diagnosis not present

## 2016-10-02 DIAGNOSIS — E1129 Type 2 diabetes mellitus with other diabetic kidney complication: Secondary | ICD-10-CM | POA: Diagnosis not present

## 2016-10-02 DIAGNOSIS — N179 Acute kidney failure, unspecified: Secondary | ICD-10-CM | POA: Diagnosis not present

## 2016-10-02 DIAGNOSIS — M6281 Muscle weakness (generalized): Secondary | ICD-10-CM | POA: Diagnosis not present

## 2016-10-03 DIAGNOSIS — N179 Acute kidney failure, unspecified: Secondary | ICD-10-CM | POA: Diagnosis not present

## 2016-10-03 DIAGNOSIS — R2689 Other abnormalities of gait and mobility: Secondary | ICD-10-CM | POA: Diagnosis not present

## 2016-10-03 DIAGNOSIS — R1313 Dysphagia, pharyngeal phase: Secondary | ICD-10-CM | POA: Diagnosis not present

## 2016-10-03 DIAGNOSIS — E1129 Type 2 diabetes mellitus with other diabetic kidney complication: Secondary | ICD-10-CM | POA: Diagnosis not present

## 2016-10-03 DIAGNOSIS — N183 Chronic kidney disease, stage 3 (moderate): Secondary | ICD-10-CM | POA: Diagnosis not present

## 2016-10-03 DIAGNOSIS — M6281 Muscle weakness (generalized): Secondary | ICD-10-CM | POA: Diagnosis not present

## 2016-10-05 DIAGNOSIS — R2689 Other abnormalities of gait and mobility: Secondary | ICD-10-CM | POA: Diagnosis not present

## 2016-10-05 DIAGNOSIS — E1129 Type 2 diabetes mellitus with other diabetic kidney complication: Secondary | ICD-10-CM | POA: Diagnosis not present

## 2016-10-05 DIAGNOSIS — N183 Chronic kidney disease, stage 3 (moderate): Secondary | ICD-10-CM | POA: Diagnosis not present

## 2016-10-05 DIAGNOSIS — N179 Acute kidney failure, unspecified: Secondary | ICD-10-CM | POA: Diagnosis not present

## 2016-10-05 DIAGNOSIS — R1313 Dysphagia, pharyngeal phase: Secondary | ICD-10-CM | POA: Diagnosis not present

## 2016-10-05 DIAGNOSIS — M6281 Muscle weakness (generalized): Secondary | ICD-10-CM | POA: Diagnosis not present

## 2016-10-06 DIAGNOSIS — E1129 Type 2 diabetes mellitus with other diabetic kidney complication: Secondary | ICD-10-CM | POA: Diagnosis not present

## 2016-10-06 DIAGNOSIS — N179 Acute kidney failure, unspecified: Secondary | ICD-10-CM | POA: Diagnosis not present

## 2016-10-06 DIAGNOSIS — M6281 Muscle weakness (generalized): Secondary | ICD-10-CM | POA: Diagnosis not present

## 2016-10-06 DIAGNOSIS — R1313 Dysphagia, pharyngeal phase: Secondary | ICD-10-CM | POA: Diagnosis not present

## 2016-10-06 DIAGNOSIS — N183 Chronic kidney disease, stage 3 (moderate): Secondary | ICD-10-CM | POA: Diagnosis not present

## 2016-10-06 DIAGNOSIS — R2689 Other abnormalities of gait and mobility: Secondary | ICD-10-CM | POA: Diagnosis not present

## 2016-10-07 DIAGNOSIS — N183 Chronic kidney disease, stage 3 (moderate): Secondary | ICD-10-CM | POA: Diagnosis not present

## 2016-10-07 DIAGNOSIS — E1129 Type 2 diabetes mellitus with other diabetic kidney complication: Secondary | ICD-10-CM | POA: Diagnosis not present

## 2016-10-07 DIAGNOSIS — R2689 Other abnormalities of gait and mobility: Secondary | ICD-10-CM | POA: Diagnosis not present

## 2016-10-07 DIAGNOSIS — N179 Acute kidney failure, unspecified: Secondary | ICD-10-CM | POA: Diagnosis not present

## 2016-10-07 DIAGNOSIS — R1313 Dysphagia, pharyngeal phase: Secondary | ICD-10-CM | POA: Diagnosis not present

## 2016-10-07 DIAGNOSIS — M6281 Muscle weakness (generalized): Secondary | ICD-10-CM | POA: Diagnosis not present

## 2016-10-08 DIAGNOSIS — N179 Acute kidney failure, unspecified: Secondary | ICD-10-CM | POA: Diagnosis not present

## 2016-10-08 DIAGNOSIS — R1313 Dysphagia, pharyngeal phase: Secondary | ICD-10-CM | POA: Diagnosis not present

## 2016-10-08 DIAGNOSIS — M6281 Muscle weakness (generalized): Secondary | ICD-10-CM | POA: Diagnosis not present

## 2016-10-08 DIAGNOSIS — N183 Chronic kidney disease, stage 3 (moderate): Secondary | ICD-10-CM | POA: Diagnosis not present

## 2016-10-08 DIAGNOSIS — E1129 Type 2 diabetes mellitus with other diabetic kidney complication: Secondary | ICD-10-CM | POA: Diagnosis not present

## 2016-10-08 DIAGNOSIS — R2689 Other abnormalities of gait and mobility: Secondary | ICD-10-CM | POA: Diagnosis not present

## 2016-10-09 DIAGNOSIS — E1129 Type 2 diabetes mellitus with other diabetic kidney complication: Secondary | ICD-10-CM | POA: Diagnosis not present

## 2016-10-09 DIAGNOSIS — M6281 Muscle weakness (generalized): Secondary | ICD-10-CM | POA: Diagnosis not present

## 2016-10-09 DIAGNOSIS — N183 Chronic kidney disease, stage 3 (moderate): Secondary | ICD-10-CM | POA: Diagnosis not present

## 2016-10-09 DIAGNOSIS — R2689 Other abnormalities of gait and mobility: Secondary | ICD-10-CM | POA: Diagnosis not present

## 2016-10-09 DIAGNOSIS — N179 Acute kidney failure, unspecified: Secondary | ICD-10-CM | POA: Diagnosis not present

## 2016-10-09 DIAGNOSIS — R1313 Dysphagia, pharyngeal phase: Secondary | ICD-10-CM | POA: Diagnosis not present

## 2016-10-10 DIAGNOSIS — N183 Chronic kidney disease, stage 3 (moderate): Secondary | ICD-10-CM | POA: Diagnosis not present

## 2016-10-10 DIAGNOSIS — E1129 Type 2 diabetes mellitus with other diabetic kidney complication: Secondary | ICD-10-CM | POA: Diagnosis not present

## 2016-10-10 DIAGNOSIS — R1313 Dysphagia, pharyngeal phase: Secondary | ICD-10-CM | POA: Diagnosis not present

## 2016-10-10 DIAGNOSIS — M6281 Muscle weakness (generalized): Secondary | ICD-10-CM | POA: Diagnosis not present

## 2016-10-10 DIAGNOSIS — N179 Acute kidney failure, unspecified: Secondary | ICD-10-CM | POA: Diagnosis not present

## 2016-10-10 DIAGNOSIS — R2689 Other abnormalities of gait and mobility: Secondary | ICD-10-CM | POA: Diagnosis not present

## 2016-10-13 DIAGNOSIS — N179 Acute kidney failure, unspecified: Secondary | ICD-10-CM | POA: Diagnosis not present

## 2016-10-13 DIAGNOSIS — N183 Chronic kidney disease, stage 3 (moderate): Secondary | ICD-10-CM | POA: Diagnosis not present

## 2016-10-13 DIAGNOSIS — R41841 Cognitive communication deficit: Secondary | ICD-10-CM | POA: Diagnosis not present

## 2016-10-13 DIAGNOSIS — E1129 Type 2 diabetes mellitus with other diabetic kidney complication: Secondary | ICD-10-CM | POA: Diagnosis not present

## 2016-10-13 DIAGNOSIS — R1313 Dysphagia, pharyngeal phase: Secondary | ICD-10-CM | POA: Diagnosis not present

## 2016-10-13 DIAGNOSIS — M6281 Muscle weakness (generalized): Secondary | ICD-10-CM | POA: Diagnosis not present

## 2016-10-13 DIAGNOSIS — R2689 Other abnormalities of gait and mobility: Secondary | ICD-10-CM | POA: Diagnosis not present

## 2016-10-14 DIAGNOSIS — E1129 Type 2 diabetes mellitus with other diabetic kidney complication: Secondary | ICD-10-CM | POA: Diagnosis not present

## 2016-10-14 DIAGNOSIS — R2689 Other abnormalities of gait and mobility: Secondary | ICD-10-CM | POA: Diagnosis not present

## 2016-10-14 DIAGNOSIS — R1313 Dysphagia, pharyngeal phase: Secondary | ICD-10-CM | POA: Diagnosis not present

## 2016-10-14 DIAGNOSIS — M6281 Muscle weakness (generalized): Secondary | ICD-10-CM | POA: Diagnosis not present

## 2016-10-14 DIAGNOSIS — N183 Chronic kidney disease, stage 3 (moderate): Secondary | ICD-10-CM | POA: Diagnosis not present

## 2016-10-14 DIAGNOSIS — N179 Acute kidney failure, unspecified: Secondary | ICD-10-CM | POA: Diagnosis not present

## 2016-10-15 DIAGNOSIS — R1313 Dysphagia, pharyngeal phase: Secondary | ICD-10-CM | POA: Diagnosis not present

## 2016-10-15 DIAGNOSIS — N179 Acute kidney failure, unspecified: Secondary | ICD-10-CM | POA: Diagnosis not present

## 2016-10-15 DIAGNOSIS — M6281 Muscle weakness (generalized): Secondary | ICD-10-CM | POA: Diagnosis not present

## 2016-10-15 DIAGNOSIS — E1129 Type 2 diabetes mellitus with other diabetic kidney complication: Secondary | ICD-10-CM | POA: Diagnosis not present

## 2016-10-15 DIAGNOSIS — R2689 Other abnormalities of gait and mobility: Secondary | ICD-10-CM | POA: Diagnosis not present

## 2016-10-15 DIAGNOSIS — N183 Chronic kidney disease, stage 3 (moderate): Secondary | ICD-10-CM | POA: Diagnosis not present

## 2016-10-16 DIAGNOSIS — R2689 Other abnormalities of gait and mobility: Secondary | ICD-10-CM | POA: Diagnosis not present

## 2016-10-16 DIAGNOSIS — R1313 Dysphagia, pharyngeal phase: Secondary | ICD-10-CM | POA: Diagnosis not present

## 2016-10-16 DIAGNOSIS — E1129 Type 2 diabetes mellitus with other diabetic kidney complication: Secondary | ICD-10-CM | POA: Diagnosis not present

## 2016-10-16 DIAGNOSIS — N183 Chronic kidney disease, stage 3 (moderate): Secondary | ICD-10-CM | POA: Diagnosis not present

## 2016-10-16 DIAGNOSIS — N179 Acute kidney failure, unspecified: Secondary | ICD-10-CM | POA: Diagnosis not present

## 2016-10-16 DIAGNOSIS — M6281 Muscle weakness (generalized): Secondary | ICD-10-CM | POA: Diagnosis not present

## 2016-10-17 DIAGNOSIS — R2689 Other abnormalities of gait and mobility: Secondary | ICD-10-CM | POA: Diagnosis not present

## 2016-10-17 DIAGNOSIS — E1129 Type 2 diabetes mellitus with other diabetic kidney complication: Secondary | ICD-10-CM | POA: Diagnosis not present

## 2016-10-17 DIAGNOSIS — R1313 Dysphagia, pharyngeal phase: Secondary | ICD-10-CM | POA: Diagnosis not present

## 2016-10-17 DIAGNOSIS — N179 Acute kidney failure, unspecified: Secondary | ICD-10-CM | POA: Diagnosis not present

## 2016-10-17 DIAGNOSIS — M6281 Muscle weakness (generalized): Secondary | ICD-10-CM | POA: Diagnosis not present

## 2016-10-17 DIAGNOSIS — N183 Chronic kidney disease, stage 3 (moderate): Secondary | ICD-10-CM | POA: Diagnosis not present

## 2016-10-18 DIAGNOSIS — N183 Chronic kidney disease, stage 3 (moderate): Secondary | ICD-10-CM | POA: Diagnosis not present

## 2016-10-18 DIAGNOSIS — R2689 Other abnormalities of gait and mobility: Secondary | ICD-10-CM | POA: Diagnosis not present

## 2016-10-18 DIAGNOSIS — N179 Acute kidney failure, unspecified: Secondary | ICD-10-CM | POA: Diagnosis not present

## 2016-10-18 DIAGNOSIS — R1313 Dysphagia, pharyngeal phase: Secondary | ICD-10-CM | POA: Diagnosis not present

## 2016-10-18 DIAGNOSIS — E1129 Type 2 diabetes mellitus with other diabetic kidney complication: Secondary | ICD-10-CM | POA: Diagnosis not present

## 2016-10-18 DIAGNOSIS — M6281 Muscle weakness (generalized): Secondary | ICD-10-CM | POA: Diagnosis not present

## 2016-10-19 DIAGNOSIS — M6281 Muscle weakness (generalized): Secondary | ICD-10-CM | POA: Diagnosis not present

## 2016-10-19 DIAGNOSIS — N183 Chronic kidney disease, stage 3 (moderate): Secondary | ICD-10-CM | POA: Diagnosis not present

## 2016-10-19 DIAGNOSIS — N179 Acute kidney failure, unspecified: Secondary | ICD-10-CM | POA: Diagnosis not present

## 2016-10-19 DIAGNOSIS — R2689 Other abnormalities of gait and mobility: Secondary | ICD-10-CM | POA: Diagnosis not present

## 2016-10-19 DIAGNOSIS — R1313 Dysphagia, pharyngeal phase: Secondary | ICD-10-CM | POA: Diagnosis not present

## 2016-10-19 DIAGNOSIS — E1129 Type 2 diabetes mellitus with other diabetic kidney complication: Secondary | ICD-10-CM | POA: Diagnosis not present

## 2016-10-20 DIAGNOSIS — R4189 Other symptoms and signs involving cognitive functions and awareness: Secondary | ICD-10-CM | POA: Diagnosis not present

## 2016-10-20 DIAGNOSIS — N179 Acute kidney failure, unspecified: Secondary | ICD-10-CM | POA: Diagnosis not present

## 2016-10-20 DIAGNOSIS — M25442 Effusion, left hand: Secondary | ICD-10-CM | POA: Diagnosis not present

## 2016-10-20 DIAGNOSIS — E1129 Type 2 diabetes mellitus with other diabetic kidney complication: Secondary | ICD-10-CM | POA: Diagnosis not present

## 2016-10-20 DIAGNOSIS — R2689 Other abnormalities of gait and mobility: Secondary | ICD-10-CM | POA: Diagnosis not present

## 2016-10-20 DIAGNOSIS — N183 Chronic kidney disease, stage 3 (moderate): Secondary | ICD-10-CM | POA: Diagnosis not present

## 2016-10-20 DIAGNOSIS — R1313 Dysphagia, pharyngeal phase: Secondary | ICD-10-CM | POA: Diagnosis not present

## 2016-10-20 DIAGNOSIS — M6281 Muscle weakness (generalized): Secondary | ICD-10-CM | POA: Diagnosis not present

## 2016-10-21 DIAGNOSIS — M6281 Muscle weakness (generalized): Secondary | ICD-10-CM | POA: Diagnosis not present

## 2016-10-21 DIAGNOSIS — E1129 Type 2 diabetes mellitus with other diabetic kidney complication: Secondary | ICD-10-CM | POA: Diagnosis not present

## 2016-10-21 DIAGNOSIS — R2689 Other abnormalities of gait and mobility: Secondary | ICD-10-CM | POA: Diagnosis not present

## 2016-10-21 DIAGNOSIS — R1313 Dysphagia, pharyngeal phase: Secondary | ICD-10-CM | POA: Diagnosis not present

## 2016-10-21 DIAGNOSIS — N183 Chronic kidney disease, stage 3 (moderate): Secondary | ICD-10-CM | POA: Diagnosis not present

## 2016-10-21 DIAGNOSIS — N179 Acute kidney failure, unspecified: Secondary | ICD-10-CM | POA: Diagnosis not present

## 2016-10-22 DIAGNOSIS — M10041 Idiopathic gout, right hand: Secondary | ICD-10-CM | POA: Diagnosis not present

## 2016-10-22 DIAGNOSIS — E0822 Diabetes mellitus due to underlying condition with diabetic chronic kidney disease: Secondary | ICD-10-CM | POA: Diagnosis not present

## 2016-10-22 DIAGNOSIS — E1129 Type 2 diabetes mellitus with other diabetic kidney complication: Secondary | ICD-10-CM | POA: Diagnosis not present

## 2016-10-22 DIAGNOSIS — N183 Chronic kidney disease, stage 3 (moderate): Secondary | ICD-10-CM | POA: Diagnosis not present

## 2016-10-22 DIAGNOSIS — N179 Acute kidney failure, unspecified: Secondary | ICD-10-CM | POA: Diagnosis not present

## 2016-10-22 DIAGNOSIS — R1313 Dysphagia, pharyngeal phase: Secondary | ICD-10-CM | POA: Diagnosis not present

## 2016-10-22 DIAGNOSIS — R2689 Other abnormalities of gait and mobility: Secondary | ICD-10-CM | POA: Diagnosis not present

## 2016-10-22 DIAGNOSIS — M6281 Muscle weakness (generalized): Secondary | ICD-10-CM | POA: Diagnosis not present

## 2016-10-23 DIAGNOSIS — N179 Acute kidney failure, unspecified: Secondary | ICD-10-CM | POA: Diagnosis not present

## 2016-10-23 DIAGNOSIS — M6281 Muscle weakness (generalized): Secondary | ICD-10-CM | POA: Diagnosis not present

## 2016-10-23 DIAGNOSIS — E1129 Type 2 diabetes mellitus with other diabetic kidney complication: Secondary | ICD-10-CM | POA: Diagnosis not present

## 2016-10-23 DIAGNOSIS — N183 Chronic kidney disease, stage 3 (moderate): Secondary | ICD-10-CM | POA: Diagnosis not present

## 2016-10-23 DIAGNOSIS — R2689 Other abnormalities of gait and mobility: Secondary | ICD-10-CM | POA: Diagnosis not present

## 2016-10-23 DIAGNOSIS — R1313 Dysphagia, pharyngeal phase: Secondary | ICD-10-CM | POA: Diagnosis not present

## 2016-10-24 DIAGNOSIS — M10041 Idiopathic gout, right hand: Secondary | ICD-10-CM | POA: Diagnosis not present

## 2016-10-24 DIAGNOSIS — N179 Acute kidney failure, unspecified: Secondary | ICD-10-CM | POA: Diagnosis not present

## 2016-10-24 DIAGNOSIS — N183 Chronic kidney disease, stage 3 (moderate): Secondary | ICD-10-CM | POA: Diagnosis not present

## 2016-10-24 DIAGNOSIS — R2689 Other abnormalities of gait and mobility: Secondary | ICD-10-CM | POA: Diagnosis not present

## 2016-10-24 DIAGNOSIS — M6281 Muscle weakness (generalized): Secondary | ICD-10-CM | POA: Diagnosis not present

## 2016-10-24 DIAGNOSIS — E1129 Type 2 diabetes mellitus with other diabetic kidney complication: Secondary | ICD-10-CM | POA: Diagnosis not present

## 2016-10-24 DIAGNOSIS — R1313 Dysphagia, pharyngeal phase: Secondary | ICD-10-CM | POA: Diagnosis not present

## 2016-10-27 DIAGNOSIS — E1129 Type 2 diabetes mellitus with other diabetic kidney complication: Secondary | ICD-10-CM | POA: Diagnosis not present

## 2016-10-27 DIAGNOSIS — R1313 Dysphagia, pharyngeal phase: Secondary | ICD-10-CM | POA: Diagnosis not present

## 2016-10-27 DIAGNOSIS — M6281 Muscle weakness (generalized): Secondary | ICD-10-CM | POA: Diagnosis not present

## 2016-10-27 DIAGNOSIS — N179 Acute kidney failure, unspecified: Secondary | ICD-10-CM | POA: Diagnosis not present

## 2016-10-27 DIAGNOSIS — N183 Chronic kidney disease, stage 3 (moderate): Secondary | ICD-10-CM | POA: Diagnosis not present

## 2016-10-27 DIAGNOSIS — R2689 Other abnormalities of gait and mobility: Secondary | ICD-10-CM | POA: Diagnosis not present

## 2016-10-28 DIAGNOSIS — M6281 Muscle weakness (generalized): Secondary | ICD-10-CM | POA: Diagnosis not present

## 2016-10-28 DIAGNOSIS — E1129 Type 2 diabetes mellitus with other diabetic kidney complication: Secondary | ICD-10-CM | POA: Diagnosis not present

## 2016-10-28 DIAGNOSIS — N179 Acute kidney failure, unspecified: Secondary | ICD-10-CM | POA: Diagnosis not present

## 2016-10-28 DIAGNOSIS — N183 Chronic kidney disease, stage 3 (moderate): Secondary | ICD-10-CM | POA: Diagnosis not present

## 2016-10-28 DIAGNOSIS — R1313 Dysphagia, pharyngeal phase: Secondary | ICD-10-CM | POA: Diagnosis not present

## 2016-10-28 DIAGNOSIS — R2689 Other abnormalities of gait and mobility: Secondary | ICD-10-CM | POA: Diagnosis not present

## 2016-10-29 DIAGNOSIS — E1129 Type 2 diabetes mellitus with other diabetic kidney complication: Secondary | ICD-10-CM | POA: Diagnosis not present

## 2016-10-29 DIAGNOSIS — R2689 Other abnormalities of gait and mobility: Secondary | ICD-10-CM | POA: Diagnosis not present

## 2016-10-29 DIAGNOSIS — I10 Essential (primary) hypertension: Secondary | ICD-10-CM | POA: Diagnosis not present

## 2016-10-29 DIAGNOSIS — M6281 Muscle weakness (generalized): Secondary | ICD-10-CM | POA: Diagnosis not present

## 2016-10-29 DIAGNOSIS — N179 Acute kidney failure, unspecified: Secondary | ICD-10-CM | POA: Diagnosis not present

## 2016-10-29 DIAGNOSIS — R1313 Dysphagia, pharyngeal phase: Secondary | ICD-10-CM | POA: Diagnosis not present

## 2016-10-29 DIAGNOSIS — M1049 Other secondary gout, multiple sites: Secondary | ICD-10-CM | POA: Diagnosis not present

## 2016-10-29 DIAGNOSIS — N183 Chronic kidney disease, stage 3 (moderate): Secondary | ICD-10-CM | POA: Diagnosis not present

## 2016-10-30 DIAGNOSIS — E1129 Type 2 diabetes mellitus with other diabetic kidney complication: Secondary | ICD-10-CM | POA: Diagnosis not present

## 2016-10-30 DIAGNOSIS — N183 Chronic kidney disease, stage 3 (moderate): Secondary | ICD-10-CM | POA: Diagnosis not present

## 2016-10-30 DIAGNOSIS — N179 Acute kidney failure, unspecified: Secondary | ICD-10-CM | POA: Diagnosis not present

## 2016-10-30 DIAGNOSIS — M6281 Muscle weakness (generalized): Secondary | ICD-10-CM | POA: Diagnosis not present

## 2016-10-30 DIAGNOSIS — R2689 Other abnormalities of gait and mobility: Secondary | ICD-10-CM | POA: Diagnosis not present

## 2016-10-30 DIAGNOSIS — R1313 Dysphagia, pharyngeal phase: Secondary | ICD-10-CM | POA: Diagnosis not present

## 2016-10-31 DIAGNOSIS — N183 Chronic kidney disease, stage 3 (moderate): Secondary | ICD-10-CM | POA: Diagnosis not present

## 2016-10-31 DIAGNOSIS — N179 Acute kidney failure, unspecified: Secondary | ICD-10-CM | POA: Diagnosis not present

## 2016-10-31 DIAGNOSIS — M6281 Muscle weakness (generalized): Secondary | ICD-10-CM | POA: Diagnosis not present

## 2016-10-31 DIAGNOSIS — R1313 Dysphagia, pharyngeal phase: Secondary | ICD-10-CM | POA: Diagnosis not present

## 2016-10-31 DIAGNOSIS — R2689 Other abnormalities of gait and mobility: Secondary | ICD-10-CM | POA: Diagnosis not present

## 2016-10-31 DIAGNOSIS — E1129 Type 2 diabetes mellitus with other diabetic kidney complication: Secondary | ICD-10-CM | POA: Diagnosis not present

## 2016-10-31 DIAGNOSIS — I10 Essential (primary) hypertension: Secondary | ICD-10-CM | POA: Diagnosis not present

## 2016-11-01 DIAGNOSIS — E1129 Type 2 diabetes mellitus with other diabetic kidney complication: Secondary | ICD-10-CM | POA: Diagnosis not present

## 2016-11-01 DIAGNOSIS — R2689 Other abnormalities of gait and mobility: Secondary | ICD-10-CM | POA: Diagnosis not present

## 2016-11-01 DIAGNOSIS — N179 Acute kidney failure, unspecified: Secondary | ICD-10-CM | POA: Diagnosis not present

## 2016-11-01 DIAGNOSIS — M6281 Muscle weakness (generalized): Secondary | ICD-10-CM | POA: Diagnosis not present

## 2016-11-01 DIAGNOSIS — N183 Chronic kidney disease, stage 3 (moderate): Secondary | ICD-10-CM | POA: Diagnosis not present

## 2016-11-01 DIAGNOSIS — R1313 Dysphagia, pharyngeal phase: Secondary | ICD-10-CM | POA: Diagnosis not present

## 2016-11-03 DIAGNOSIS — R2689 Other abnormalities of gait and mobility: Secondary | ICD-10-CM | POA: Diagnosis not present

## 2016-11-03 DIAGNOSIS — R1313 Dysphagia, pharyngeal phase: Secondary | ICD-10-CM | POA: Diagnosis not present

## 2016-11-03 DIAGNOSIS — N183 Chronic kidney disease, stage 3 (moderate): Secondary | ICD-10-CM | POA: Diagnosis not present

## 2016-11-03 DIAGNOSIS — N179 Acute kidney failure, unspecified: Secondary | ICD-10-CM | POA: Diagnosis not present

## 2016-11-03 DIAGNOSIS — E1129 Type 2 diabetes mellitus with other diabetic kidney complication: Secondary | ICD-10-CM | POA: Diagnosis not present

## 2016-11-03 DIAGNOSIS — M6281 Muscle weakness (generalized): Secondary | ICD-10-CM | POA: Diagnosis not present

## 2016-11-04 DIAGNOSIS — M6281 Muscle weakness (generalized): Secondary | ICD-10-CM | POA: Diagnosis not present

## 2016-11-04 DIAGNOSIS — N183 Chronic kidney disease, stage 3 (moderate): Secondary | ICD-10-CM | POA: Diagnosis not present

## 2016-11-04 DIAGNOSIS — R1313 Dysphagia, pharyngeal phase: Secondary | ICD-10-CM | POA: Diagnosis not present

## 2016-11-04 DIAGNOSIS — R2689 Other abnormalities of gait and mobility: Secondary | ICD-10-CM | POA: Diagnosis not present

## 2016-11-04 DIAGNOSIS — N179 Acute kidney failure, unspecified: Secondary | ICD-10-CM | POA: Diagnosis not present

## 2016-11-04 DIAGNOSIS — E1129 Type 2 diabetes mellitus with other diabetic kidney complication: Secondary | ICD-10-CM | POA: Diagnosis not present

## 2016-11-05 DIAGNOSIS — R1313 Dysphagia, pharyngeal phase: Secondary | ICD-10-CM | POA: Diagnosis not present

## 2016-11-05 DIAGNOSIS — N183 Chronic kidney disease, stage 3 (moderate): Secondary | ICD-10-CM | POA: Diagnosis not present

## 2016-11-05 DIAGNOSIS — E1129 Type 2 diabetes mellitus with other diabetic kidney complication: Secondary | ICD-10-CM | POA: Diagnosis not present

## 2016-11-05 DIAGNOSIS — M6281 Muscle weakness (generalized): Secondary | ICD-10-CM | POA: Diagnosis not present

## 2016-11-05 DIAGNOSIS — N179 Acute kidney failure, unspecified: Secondary | ICD-10-CM | POA: Diagnosis not present

## 2016-11-05 DIAGNOSIS — R2689 Other abnormalities of gait and mobility: Secondary | ICD-10-CM | POA: Diagnosis not present

## 2016-11-06 DIAGNOSIS — E1129 Type 2 diabetes mellitus with other diabetic kidney complication: Secondary | ICD-10-CM | POA: Diagnosis not present

## 2016-11-06 DIAGNOSIS — N179 Acute kidney failure, unspecified: Secondary | ICD-10-CM | POA: Diagnosis not present

## 2016-11-06 DIAGNOSIS — M6281 Muscle weakness (generalized): Secondary | ICD-10-CM | POA: Diagnosis not present

## 2016-11-06 DIAGNOSIS — N183 Chronic kidney disease, stage 3 (moderate): Secondary | ICD-10-CM | POA: Diagnosis not present

## 2016-11-06 DIAGNOSIS — R1313 Dysphagia, pharyngeal phase: Secondary | ICD-10-CM | POA: Diagnosis not present

## 2016-11-06 DIAGNOSIS — R2689 Other abnormalities of gait and mobility: Secondary | ICD-10-CM | POA: Diagnosis not present

## 2016-11-07 DIAGNOSIS — N179 Acute kidney failure, unspecified: Secondary | ICD-10-CM | POA: Diagnosis not present

## 2016-11-07 DIAGNOSIS — N183 Chronic kidney disease, stage 3 (moderate): Secondary | ICD-10-CM | POA: Diagnosis not present

## 2016-11-07 DIAGNOSIS — R1313 Dysphagia, pharyngeal phase: Secondary | ICD-10-CM | POA: Diagnosis not present

## 2016-11-07 DIAGNOSIS — R2689 Other abnormalities of gait and mobility: Secondary | ICD-10-CM | POA: Diagnosis not present

## 2016-11-07 DIAGNOSIS — E1129 Type 2 diabetes mellitus with other diabetic kidney complication: Secondary | ICD-10-CM | POA: Diagnosis not present

## 2016-11-07 DIAGNOSIS — M6281 Muscle weakness (generalized): Secondary | ICD-10-CM | POA: Diagnosis not present

## 2016-11-10 DIAGNOSIS — R2689 Other abnormalities of gait and mobility: Secondary | ICD-10-CM | POA: Diagnosis not present

## 2016-11-10 DIAGNOSIS — M6281 Muscle weakness (generalized): Secondary | ICD-10-CM | POA: Diagnosis not present

## 2016-11-10 DIAGNOSIS — N179 Acute kidney failure, unspecified: Secondary | ICD-10-CM | POA: Diagnosis not present

## 2016-11-10 DIAGNOSIS — N183 Chronic kidney disease, stage 3 (moderate): Secondary | ICD-10-CM | POA: Diagnosis not present

## 2016-11-10 DIAGNOSIS — R1313 Dysphagia, pharyngeal phase: Secondary | ICD-10-CM | POA: Diagnosis not present

## 2016-11-10 DIAGNOSIS — E1129 Type 2 diabetes mellitus with other diabetic kidney complication: Secondary | ICD-10-CM | POA: Diagnosis not present

## 2016-11-11 DIAGNOSIS — R2689 Other abnormalities of gait and mobility: Secondary | ICD-10-CM | POA: Diagnosis not present

## 2016-11-11 DIAGNOSIS — R1313 Dysphagia, pharyngeal phase: Secondary | ICD-10-CM | POA: Diagnosis not present

## 2016-11-11 DIAGNOSIS — N179 Acute kidney failure, unspecified: Secondary | ICD-10-CM | POA: Diagnosis not present

## 2016-11-11 DIAGNOSIS — M6281 Muscle weakness (generalized): Secondary | ICD-10-CM | POA: Diagnosis not present

## 2016-11-11 DIAGNOSIS — E1129 Type 2 diabetes mellitus with other diabetic kidney complication: Secondary | ICD-10-CM | POA: Diagnosis not present

## 2016-11-11 DIAGNOSIS — N183 Chronic kidney disease, stage 3 (moderate): Secondary | ICD-10-CM | POA: Diagnosis not present

## 2016-11-12 DIAGNOSIS — R41841 Cognitive communication deficit: Secondary | ICD-10-CM | POA: Diagnosis not present

## 2016-11-12 DIAGNOSIS — R1313 Dysphagia, pharyngeal phase: Secondary | ICD-10-CM | POA: Diagnosis not present

## 2016-11-12 DIAGNOSIS — N179 Acute kidney failure, unspecified: Secondary | ICD-10-CM | POA: Diagnosis not present

## 2016-11-12 DIAGNOSIS — R2689 Other abnormalities of gait and mobility: Secondary | ICD-10-CM | POA: Diagnosis not present

## 2016-11-12 DIAGNOSIS — E1129 Type 2 diabetes mellitus with other diabetic kidney complication: Secondary | ICD-10-CM | POA: Diagnosis not present

## 2016-11-12 DIAGNOSIS — N183 Chronic kidney disease, stage 3 (moderate): Secondary | ICD-10-CM | POA: Diagnosis not present

## 2016-11-12 DIAGNOSIS — M6281 Muscle weakness (generalized): Secondary | ICD-10-CM | POA: Diagnosis not present

## 2016-11-13 DIAGNOSIS — M6281 Muscle weakness (generalized): Secondary | ICD-10-CM | POA: Diagnosis not present

## 2016-11-13 DIAGNOSIS — E1129 Type 2 diabetes mellitus with other diabetic kidney complication: Secondary | ICD-10-CM | POA: Diagnosis not present

## 2016-11-13 DIAGNOSIS — R2689 Other abnormalities of gait and mobility: Secondary | ICD-10-CM | POA: Diagnosis not present

## 2016-11-13 DIAGNOSIS — N183 Chronic kidney disease, stage 3 (moderate): Secondary | ICD-10-CM | POA: Diagnosis not present

## 2016-11-13 DIAGNOSIS — N179 Acute kidney failure, unspecified: Secondary | ICD-10-CM | POA: Diagnosis not present

## 2016-11-13 DIAGNOSIS — R1313 Dysphagia, pharyngeal phase: Secondary | ICD-10-CM | POA: Diagnosis not present

## 2016-11-14 DIAGNOSIS — R2689 Other abnormalities of gait and mobility: Secondary | ICD-10-CM | POA: Diagnosis not present

## 2016-11-14 DIAGNOSIS — R1313 Dysphagia, pharyngeal phase: Secondary | ICD-10-CM | POA: Diagnosis not present

## 2016-11-14 DIAGNOSIS — E1129 Type 2 diabetes mellitus with other diabetic kidney complication: Secondary | ICD-10-CM | POA: Diagnosis not present

## 2016-11-14 DIAGNOSIS — N183 Chronic kidney disease, stage 3 (moderate): Secondary | ICD-10-CM | POA: Diagnosis not present

## 2016-11-14 DIAGNOSIS — N179 Acute kidney failure, unspecified: Secondary | ICD-10-CM | POA: Diagnosis not present

## 2016-11-14 DIAGNOSIS — M6281 Muscle weakness (generalized): Secondary | ICD-10-CM | POA: Diagnosis not present

## 2016-11-17 DIAGNOSIS — M6281 Muscle weakness (generalized): Secondary | ICD-10-CM | POA: Diagnosis not present

## 2016-11-17 DIAGNOSIS — N179 Acute kidney failure, unspecified: Secondary | ICD-10-CM | POA: Diagnosis not present

## 2016-11-17 DIAGNOSIS — E1129 Type 2 diabetes mellitus with other diabetic kidney complication: Secondary | ICD-10-CM | POA: Diagnosis not present

## 2016-11-17 DIAGNOSIS — R1313 Dysphagia, pharyngeal phase: Secondary | ICD-10-CM | POA: Diagnosis not present

## 2016-11-17 DIAGNOSIS — N183 Chronic kidney disease, stage 3 (moderate): Secondary | ICD-10-CM | POA: Diagnosis not present

## 2016-11-17 DIAGNOSIS — R2689 Other abnormalities of gait and mobility: Secondary | ICD-10-CM | POA: Diagnosis not present

## 2016-11-18 DIAGNOSIS — E1129 Type 2 diabetes mellitus with other diabetic kidney complication: Secondary | ICD-10-CM | POA: Diagnosis not present

## 2016-11-18 DIAGNOSIS — N183 Chronic kidney disease, stage 3 (moderate): Secondary | ICD-10-CM | POA: Diagnosis not present

## 2016-11-18 DIAGNOSIS — R2689 Other abnormalities of gait and mobility: Secondary | ICD-10-CM | POA: Diagnosis not present

## 2016-11-18 DIAGNOSIS — N179 Acute kidney failure, unspecified: Secondary | ICD-10-CM | POA: Diagnosis not present

## 2016-11-18 DIAGNOSIS — M6281 Muscle weakness (generalized): Secondary | ICD-10-CM | POA: Diagnosis not present

## 2016-11-18 DIAGNOSIS — R1313 Dysphagia, pharyngeal phase: Secondary | ICD-10-CM | POA: Diagnosis not present

## 2016-11-19 DIAGNOSIS — N179 Acute kidney failure, unspecified: Secondary | ICD-10-CM | POA: Diagnosis not present

## 2016-11-19 DIAGNOSIS — M6281 Muscle weakness (generalized): Secondary | ICD-10-CM | POA: Diagnosis not present

## 2016-11-19 DIAGNOSIS — R2689 Other abnormalities of gait and mobility: Secondary | ICD-10-CM | POA: Diagnosis not present

## 2016-11-19 DIAGNOSIS — E1129 Type 2 diabetes mellitus with other diabetic kidney complication: Secondary | ICD-10-CM | POA: Diagnosis not present

## 2016-11-19 DIAGNOSIS — R1313 Dysphagia, pharyngeal phase: Secondary | ICD-10-CM | POA: Diagnosis not present

## 2016-11-19 DIAGNOSIS — N183 Chronic kidney disease, stage 3 (moderate): Secondary | ICD-10-CM | POA: Diagnosis not present

## 2016-11-21 DIAGNOSIS — R2689 Other abnormalities of gait and mobility: Secondary | ICD-10-CM | POA: Diagnosis not present

## 2016-11-21 DIAGNOSIS — M6281 Muscle weakness (generalized): Secondary | ICD-10-CM | POA: Diagnosis not present

## 2016-11-21 DIAGNOSIS — R1313 Dysphagia, pharyngeal phase: Secondary | ICD-10-CM | POA: Diagnosis not present

## 2016-11-21 DIAGNOSIS — E1129 Type 2 diabetes mellitus with other diabetic kidney complication: Secondary | ICD-10-CM | POA: Diagnosis not present

## 2016-11-21 DIAGNOSIS — N183 Chronic kidney disease, stage 3 (moderate): Secondary | ICD-10-CM | POA: Diagnosis not present

## 2016-11-21 DIAGNOSIS — N179 Acute kidney failure, unspecified: Secondary | ICD-10-CM | POA: Diagnosis not present

## 2016-11-24 DIAGNOSIS — R1313 Dysphagia, pharyngeal phase: Secondary | ICD-10-CM | POA: Diagnosis not present

## 2016-11-24 DIAGNOSIS — N183 Chronic kidney disease, stage 3 (moderate): Secondary | ICD-10-CM | POA: Diagnosis not present

## 2016-11-24 DIAGNOSIS — E1129 Type 2 diabetes mellitus with other diabetic kidney complication: Secondary | ICD-10-CM | POA: Diagnosis not present

## 2016-11-24 DIAGNOSIS — N179 Acute kidney failure, unspecified: Secondary | ICD-10-CM | POA: Diagnosis not present

## 2016-11-24 DIAGNOSIS — M6281 Muscle weakness (generalized): Secondary | ICD-10-CM | POA: Diagnosis not present

## 2016-11-24 DIAGNOSIS — R2689 Other abnormalities of gait and mobility: Secondary | ICD-10-CM | POA: Diagnosis not present

## 2016-11-26 DIAGNOSIS — R2689 Other abnormalities of gait and mobility: Secondary | ICD-10-CM | POA: Diagnosis not present

## 2016-11-26 DIAGNOSIS — R1313 Dysphagia, pharyngeal phase: Secondary | ICD-10-CM | POA: Diagnosis not present

## 2016-11-26 DIAGNOSIS — E1129 Type 2 diabetes mellitus with other diabetic kidney complication: Secondary | ICD-10-CM | POA: Diagnosis not present

## 2016-11-26 DIAGNOSIS — M6281 Muscle weakness (generalized): Secondary | ICD-10-CM | POA: Diagnosis not present

## 2016-11-26 DIAGNOSIS — N183 Chronic kidney disease, stage 3 (moderate): Secondary | ICD-10-CM | POA: Diagnosis not present

## 2016-11-26 DIAGNOSIS — N179 Acute kidney failure, unspecified: Secondary | ICD-10-CM | POA: Diagnosis not present

## 2016-11-28 DIAGNOSIS — R1313 Dysphagia, pharyngeal phase: Secondary | ICD-10-CM | POA: Diagnosis not present

## 2016-11-28 DIAGNOSIS — M6281 Muscle weakness (generalized): Secondary | ICD-10-CM | POA: Diagnosis not present

## 2016-11-28 DIAGNOSIS — N179 Acute kidney failure, unspecified: Secondary | ICD-10-CM | POA: Diagnosis not present

## 2016-11-28 DIAGNOSIS — R2689 Other abnormalities of gait and mobility: Secondary | ICD-10-CM | POA: Diagnosis not present

## 2016-11-28 DIAGNOSIS — N183 Chronic kidney disease, stage 3 (moderate): Secondary | ICD-10-CM | POA: Diagnosis not present

## 2016-11-28 DIAGNOSIS — E1129 Type 2 diabetes mellitus with other diabetic kidney complication: Secondary | ICD-10-CM | POA: Diagnosis not present

## 2016-12-01 DIAGNOSIS — R2689 Other abnormalities of gait and mobility: Secondary | ICD-10-CM | POA: Diagnosis not present

## 2016-12-01 DIAGNOSIS — R1313 Dysphagia, pharyngeal phase: Secondary | ICD-10-CM | POA: Diagnosis not present

## 2016-12-01 DIAGNOSIS — N179 Acute kidney failure, unspecified: Secondary | ICD-10-CM | POA: Diagnosis not present

## 2016-12-01 DIAGNOSIS — E1129 Type 2 diabetes mellitus with other diabetic kidney complication: Secondary | ICD-10-CM | POA: Diagnosis not present

## 2016-12-01 DIAGNOSIS — N183 Chronic kidney disease, stage 3 (moderate): Secondary | ICD-10-CM | POA: Diagnosis not present

## 2016-12-01 DIAGNOSIS — M6281 Muscle weakness (generalized): Secondary | ICD-10-CM | POA: Diagnosis not present

## 2016-12-02 DIAGNOSIS — R2689 Other abnormalities of gait and mobility: Secondary | ICD-10-CM | POA: Diagnosis not present

## 2016-12-02 DIAGNOSIS — R1313 Dysphagia, pharyngeal phase: Secondary | ICD-10-CM | POA: Diagnosis not present

## 2016-12-02 DIAGNOSIS — M6281 Muscle weakness (generalized): Secondary | ICD-10-CM | POA: Diagnosis not present

## 2016-12-02 DIAGNOSIS — E1129 Type 2 diabetes mellitus with other diabetic kidney complication: Secondary | ICD-10-CM | POA: Diagnosis not present

## 2016-12-02 DIAGNOSIS — N183 Chronic kidney disease, stage 3 (moderate): Secondary | ICD-10-CM | POA: Diagnosis not present

## 2016-12-02 DIAGNOSIS — N179 Acute kidney failure, unspecified: Secondary | ICD-10-CM | POA: Diagnosis not present

## 2016-12-03 DIAGNOSIS — N183 Chronic kidney disease, stage 3 (moderate): Secondary | ICD-10-CM | POA: Diagnosis not present

## 2016-12-03 DIAGNOSIS — E1129 Type 2 diabetes mellitus with other diabetic kidney complication: Secondary | ICD-10-CM | POA: Diagnosis not present

## 2016-12-03 DIAGNOSIS — N179 Acute kidney failure, unspecified: Secondary | ICD-10-CM | POA: Diagnosis not present

## 2016-12-03 DIAGNOSIS — R2689 Other abnormalities of gait and mobility: Secondary | ICD-10-CM | POA: Diagnosis not present

## 2016-12-03 DIAGNOSIS — M6281 Muscle weakness (generalized): Secondary | ICD-10-CM | POA: Diagnosis not present

## 2016-12-03 DIAGNOSIS — R1313 Dysphagia, pharyngeal phase: Secondary | ICD-10-CM | POA: Diagnosis not present

## 2016-12-04 DIAGNOSIS — N179 Acute kidney failure, unspecified: Secondary | ICD-10-CM | POA: Diagnosis not present

## 2016-12-04 DIAGNOSIS — R2689 Other abnormalities of gait and mobility: Secondary | ICD-10-CM | POA: Diagnosis not present

## 2016-12-04 DIAGNOSIS — M6281 Muscle weakness (generalized): Secondary | ICD-10-CM | POA: Diagnosis not present

## 2016-12-04 DIAGNOSIS — E1129 Type 2 diabetes mellitus with other diabetic kidney complication: Secondary | ICD-10-CM | POA: Diagnosis not present

## 2016-12-04 DIAGNOSIS — R1313 Dysphagia, pharyngeal phase: Secondary | ICD-10-CM | POA: Diagnosis not present

## 2016-12-04 DIAGNOSIS — N183 Chronic kidney disease, stage 3 (moderate): Secondary | ICD-10-CM | POA: Diagnosis not present

## 2016-12-05 DIAGNOSIS — R2689 Other abnormalities of gait and mobility: Secondary | ICD-10-CM | POA: Diagnosis not present

## 2016-12-05 DIAGNOSIS — M6281 Muscle weakness (generalized): Secondary | ICD-10-CM | POA: Diagnosis not present

## 2016-12-05 DIAGNOSIS — E1129 Type 2 diabetes mellitus with other diabetic kidney complication: Secondary | ICD-10-CM | POA: Diagnosis not present

## 2016-12-05 DIAGNOSIS — N183 Chronic kidney disease, stage 3 (moderate): Secondary | ICD-10-CM | POA: Diagnosis not present

## 2016-12-05 DIAGNOSIS — N179 Acute kidney failure, unspecified: Secondary | ICD-10-CM | POA: Diagnosis not present

## 2016-12-05 DIAGNOSIS — R1313 Dysphagia, pharyngeal phase: Secondary | ICD-10-CM | POA: Diagnosis not present

## 2016-12-09 DIAGNOSIS — R2689 Other abnormalities of gait and mobility: Secondary | ICD-10-CM | POA: Diagnosis not present

## 2016-12-09 DIAGNOSIS — M6281 Muscle weakness (generalized): Secondary | ICD-10-CM | POA: Diagnosis not present

## 2016-12-09 DIAGNOSIS — R1313 Dysphagia, pharyngeal phase: Secondary | ICD-10-CM | POA: Diagnosis not present

## 2016-12-09 DIAGNOSIS — E1129 Type 2 diabetes mellitus with other diabetic kidney complication: Secondary | ICD-10-CM | POA: Diagnosis not present

## 2016-12-09 DIAGNOSIS — N179 Acute kidney failure, unspecified: Secondary | ICD-10-CM | POA: Diagnosis not present

## 2016-12-09 DIAGNOSIS — N183 Chronic kidney disease, stage 3 (moderate): Secondary | ICD-10-CM | POA: Diagnosis not present

## 2016-12-10 DIAGNOSIS — R1313 Dysphagia, pharyngeal phase: Secondary | ICD-10-CM | POA: Diagnosis not present

## 2016-12-10 DIAGNOSIS — E1129 Type 2 diabetes mellitus with other diabetic kidney complication: Secondary | ICD-10-CM | POA: Diagnosis not present

## 2016-12-10 DIAGNOSIS — M6281 Muscle weakness (generalized): Secondary | ICD-10-CM | POA: Diagnosis not present

## 2016-12-10 DIAGNOSIS — N183 Chronic kidney disease, stage 3 (moderate): Secondary | ICD-10-CM | POA: Diagnosis not present

## 2016-12-10 DIAGNOSIS — N179 Acute kidney failure, unspecified: Secondary | ICD-10-CM | POA: Diagnosis not present

## 2016-12-10 DIAGNOSIS — R2689 Other abnormalities of gait and mobility: Secondary | ICD-10-CM | POA: Diagnosis not present

## 2016-12-11 DIAGNOSIS — M6281 Muscle weakness (generalized): Secondary | ICD-10-CM | POA: Diagnosis not present

## 2016-12-11 DIAGNOSIS — E1129 Type 2 diabetes mellitus with other diabetic kidney complication: Secondary | ICD-10-CM | POA: Diagnosis not present

## 2016-12-11 DIAGNOSIS — N183 Chronic kidney disease, stage 3 (moderate): Secondary | ICD-10-CM | POA: Diagnosis not present

## 2016-12-11 DIAGNOSIS — R2689 Other abnormalities of gait and mobility: Secondary | ICD-10-CM | POA: Diagnosis not present

## 2016-12-11 DIAGNOSIS — N179 Acute kidney failure, unspecified: Secondary | ICD-10-CM | POA: Diagnosis not present

## 2016-12-11 DIAGNOSIS — R1313 Dysphagia, pharyngeal phase: Secondary | ICD-10-CM | POA: Diagnosis not present

## 2016-12-12 DIAGNOSIS — E1129 Type 2 diabetes mellitus with other diabetic kidney complication: Secondary | ICD-10-CM | POA: Diagnosis not present

## 2016-12-12 DIAGNOSIS — N179 Acute kidney failure, unspecified: Secondary | ICD-10-CM | POA: Diagnosis not present

## 2016-12-12 DIAGNOSIS — R1313 Dysphagia, pharyngeal phase: Secondary | ICD-10-CM | POA: Diagnosis not present

## 2016-12-12 DIAGNOSIS — M6281 Muscle weakness (generalized): Secondary | ICD-10-CM | POA: Diagnosis not present

## 2016-12-12 DIAGNOSIS — N183 Chronic kidney disease, stage 3 (moderate): Secondary | ICD-10-CM | POA: Diagnosis not present

## 2016-12-12 DIAGNOSIS — R2689 Other abnormalities of gait and mobility: Secondary | ICD-10-CM | POA: Diagnosis not present

## 2016-12-15 DIAGNOSIS — R41841 Cognitive communication deficit: Secondary | ICD-10-CM | POA: Diagnosis not present

## 2016-12-15 DIAGNOSIS — R1313 Dysphagia, pharyngeal phase: Secondary | ICD-10-CM | POA: Diagnosis not present

## 2016-12-15 DIAGNOSIS — R2689 Other abnormalities of gait and mobility: Secondary | ICD-10-CM | POA: Diagnosis not present

## 2016-12-15 DIAGNOSIS — N183 Chronic kidney disease, stage 3 (moderate): Secondary | ICD-10-CM | POA: Diagnosis not present

## 2016-12-15 DIAGNOSIS — M6281 Muscle weakness (generalized): Secondary | ICD-10-CM | POA: Diagnosis not present

## 2016-12-15 DIAGNOSIS — N179 Acute kidney failure, unspecified: Secondary | ICD-10-CM | POA: Diagnosis not present

## 2016-12-15 DIAGNOSIS — E1129 Type 2 diabetes mellitus with other diabetic kidney complication: Secondary | ICD-10-CM | POA: Diagnosis not present

## 2016-12-16 DIAGNOSIS — R1313 Dysphagia, pharyngeal phase: Secondary | ICD-10-CM | POA: Diagnosis not present

## 2016-12-16 DIAGNOSIS — N183 Chronic kidney disease, stage 3 (moderate): Secondary | ICD-10-CM | POA: Diagnosis not present

## 2016-12-16 DIAGNOSIS — M6281 Muscle weakness (generalized): Secondary | ICD-10-CM | POA: Diagnosis not present

## 2016-12-16 DIAGNOSIS — E1129 Type 2 diabetes mellitus with other diabetic kidney complication: Secondary | ICD-10-CM | POA: Diagnosis not present

## 2016-12-16 DIAGNOSIS — R2689 Other abnormalities of gait and mobility: Secondary | ICD-10-CM | POA: Diagnosis not present

## 2016-12-16 DIAGNOSIS — N179 Acute kidney failure, unspecified: Secondary | ICD-10-CM | POA: Diagnosis not present

## 2016-12-17 DIAGNOSIS — R2689 Other abnormalities of gait and mobility: Secondary | ICD-10-CM | POA: Diagnosis not present

## 2016-12-17 DIAGNOSIS — R1313 Dysphagia, pharyngeal phase: Secondary | ICD-10-CM | POA: Diagnosis not present

## 2016-12-17 DIAGNOSIS — E1129 Type 2 diabetes mellitus with other diabetic kidney complication: Secondary | ICD-10-CM | POA: Diagnosis not present

## 2016-12-17 DIAGNOSIS — N183 Chronic kidney disease, stage 3 (moderate): Secondary | ICD-10-CM | POA: Diagnosis not present

## 2016-12-17 DIAGNOSIS — M6281 Muscle weakness (generalized): Secondary | ICD-10-CM | POA: Diagnosis not present

## 2016-12-17 DIAGNOSIS — N179 Acute kidney failure, unspecified: Secondary | ICD-10-CM | POA: Diagnosis not present

## 2016-12-18 DIAGNOSIS — N179 Acute kidney failure, unspecified: Secondary | ICD-10-CM | POA: Diagnosis not present

## 2016-12-18 DIAGNOSIS — E1129 Type 2 diabetes mellitus with other diabetic kidney complication: Secondary | ICD-10-CM | POA: Diagnosis not present

## 2016-12-18 DIAGNOSIS — N183 Chronic kidney disease, stage 3 (moderate): Secondary | ICD-10-CM | POA: Diagnosis not present

## 2016-12-18 DIAGNOSIS — R1313 Dysphagia, pharyngeal phase: Secondary | ICD-10-CM | POA: Diagnosis not present

## 2016-12-18 DIAGNOSIS — M6281 Muscle weakness (generalized): Secondary | ICD-10-CM | POA: Diagnosis not present

## 2016-12-18 DIAGNOSIS — R2689 Other abnormalities of gait and mobility: Secondary | ICD-10-CM | POA: Diagnosis not present

## 2016-12-19 DIAGNOSIS — N183 Chronic kidney disease, stage 3 (moderate): Secondary | ICD-10-CM | POA: Diagnosis not present

## 2016-12-19 DIAGNOSIS — M6281 Muscle weakness (generalized): Secondary | ICD-10-CM | POA: Diagnosis not present

## 2016-12-19 DIAGNOSIS — N179 Acute kidney failure, unspecified: Secondary | ICD-10-CM | POA: Diagnosis not present

## 2016-12-19 DIAGNOSIS — E1129 Type 2 diabetes mellitus with other diabetic kidney complication: Secondary | ICD-10-CM | POA: Diagnosis not present

## 2016-12-19 DIAGNOSIS — R1313 Dysphagia, pharyngeal phase: Secondary | ICD-10-CM | POA: Diagnosis not present

## 2016-12-19 DIAGNOSIS — R2689 Other abnormalities of gait and mobility: Secondary | ICD-10-CM | POA: Diagnosis not present

## 2016-12-22 DIAGNOSIS — N179 Acute kidney failure, unspecified: Secondary | ICD-10-CM | POA: Diagnosis not present

## 2016-12-22 DIAGNOSIS — M6281 Muscle weakness (generalized): Secondary | ICD-10-CM | POA: Diagnosis not present

## 2016-12-22 DIAGNOSIS — N183 Chronic kidney disease, stage 3 (moderate): Secondary | ICD-10-CM | POA: Diagnosis not present

## 2016-12-22 DIAGNOSIS — R1313 Dysphagia, pharyngeal phase: Secondary | ICD-10-CM | POA: Diagnosis not present

## 2016-12-22 DIAGNOSIS — E1129 Type 2 diabetes mellitus with other diabetic kidney complication: Secondary | ICD-10-CM | POA: Diagnosis not present

## 2016-12-22 DIAGNOSIS — R2689 Other abnormalities of gait and mobility: Secondary | ICD-10-CM | POA: Diagnosis not present

## 2016-12-23 DIAGNOSIS — E1129 Type 2 diabetes mellitus with other diabetic kidney complication: Secondary | ICD-10-CM | POA: Diagnosis not present

## 2016-12-23 DIAGNOSIS — N183 Chronic kidney disease, stage 3 (moderate): Secondary | ICD-10-CM | POA: Diagnosis not present

## 2016-12-23 DIAGNOSIS — R2689 Other abnormalities of gait and mobility: Secondary | ICD-10-CM | POA: Diagnosis not present

## 2016-12-23 DIAGNOSIS — R1313 Dysphagia, pharyngeal phase: Secondary | ICD-10-CM | POA: Diagnosis not present

## 2016-12-23 DIAGNOSIS — N179 Acute kidney failure, unspecified: Secondary | ICD-10-CM | POA: Diagnosis not present

## 2016-12-23 DIAGNOSIS — M6281 Muscle weakness (generalized): Secondary | ICD-10-CM | POA: Diagnosis not present

## 2016-12-24 DIAGNOSIS — R2689 Other abnormalities of gait and mobility: Secondary | ICD-10-CM | POA: Diagnosis not present

## 2016-12-24 DIAGNOSIS — M6281 Muscle weakness (generalized): Secondary | ICD-10-CM | POA: Diagnosis not present

## 2016-12-24 DIAGNOSIS — N183 Chronic kidney disease, stage 3 (moderate): Secondary | ICD-10-CM | POA: Diagnosis not present

## 2016-12-24 DIAGNOSIS — R1313 Dysphagia, pharyngeal phase: Secondary | ICD-10-CM | POA: Diagnosis not present

## 2016-12-24 DIAGNOSIS — E1129 Type 2 diabetes mellitus with other diabetic kidney complication: Secondary | ICD-10-CM | POA: Diagnosis not present

## 2016-12-24 DIAGNOSIS — N179 Acute kidney failure, unspecified: Secondary | ICD-10-CM | POA: Diagnosis not present

## 2016-12-25 DIAGNOSIS — R2689 Other abnormalities of gait and mobility: Secondary | ICD-10-CM | POA: Diagnosis not present

## 2016-12-25 DIAGNOSIS — M6281 Muscle weakness (generalized): Secondary | ICD-10-CM | POA: Diagnosis not present

## 2016-12-25 DIAGNOSIS — N183 Chronic kidney disease, stage 3 (moderate): Secondary | ICD-10-CM | POA: Diagnosis not present

## 2016-12-25 DIAGNOSIS — R1313 Dysphagia, pharyngeal phase: Secondary | ICD-10-CM | POA: Diagnosis not present

## 2016-12-25 DIAGNOSIS — E1129 Type 2 diabetes mellitus with other diabetic kidney complication: Secondary | ICD-10-CM | POA: Diagnosis not present

## 2016-12-25 DIAGNOSIS — N179 Acute kidney failure, unspecified: Secondary | ICD-10-CM | POA: Diagnosis not present

## 2016-12-26 DIAGNOSIS — N179 Acute kidney failure, unspecified: Secondary | ICD-10-CM | POA: Diagnosis not present

## 2016-12-26 DIAGNOSIS — E1129 Type 2 diabetes mellitus with other diabetic kidney complication: Secondary | ICD-10-CM | POA: Diagnosis not present

## 2016-12-26 DIAGNOSIS — R1313 Dysphagia, pharyngeal phase: Secondary | ICD-10-CM | POA: Diagnosis not present

## 2016-12-26 DIAGNOSIS — R2689 Other abnormalities of gait and mobility: Secondary | ICD-10-CM | POA: Diagnosis not present

## 2016-12-26 DIAGNOSIS — N183 Chronic kidney disease, stage 3 (moderate): Secondary | ICD-10-CM | POA: Diagnosis not present

## 2016-12-26 DIAGNOSIS — M6281 Muscle weakness (generalized): Secondary | ICD-10-CM | POA: Diagnosis not present

## 2016-12-28 DIAGNOSIS — R1313 Dysphagia, pharyngeal phase: Secondary | ICD-10-CM | POA: Diagnosis not present

## 2016-12-28 DIAGNOSIS — N179 Acute kidney failure, unspecified: Secondary | ICD-10-CM | POA: Diagnosis not present

## 2016-12-28 DIAGNOSIS — M6281 Muscle weakness (generalized): Secondary | ICD-10-CM | POA: Diagnosis not present

## 2016-12-28 DIAGNOSIS — N183 Chronic kidney disease, stage 3 (moderate): Secondary | ICD-10-CM | POA: Diagnosis not present

## 2016-12-28 DIAGNOSIS — E1129 Type 2 diabetes mellitus with other diabetic kidney complication: Secondary | ICD-10-CM | POA: Diagnosis not present

## 2016-12-28 DIAGNOSIS — R2689 Other abnormalities of gait and mobility: Secondary | ICD-10-CM | POA: Diagnosis not present

## 2016-12-29 DIAGNOSIS — R2689 Other abnormalities of gait and mobility: Secondary | ICD-10-CM | POA: Diagnosis not present

## 2016-12-29 DIAGNOSIS — M6281 Muscle weakness (generalized): Secondary | ICD-10-CM | POA: Diagnosis not present

## 2016-12-29 DIAGNOSIS — N179 Acute kidney failure, unspecified: Secondary | ICD-10-CM | POA: Diagnosis not present

## 2016-12-29 DIAGNOSIS — E1129 Type 2 diabetes mellitus with other diabetic kidney complication: Secondary | ICD-10-CM | POA: Diagnosis not present

## 2016-12-29 DIAGNOSIS — N183 Chronic kidney disease, stage 3 (moderate): Secondary | ICD-10-CM | POA: Diagnosis not present

## 2016-12-29 DIAGNOSIS — R1313 Dysphagia, pharyngeal phase: Secondary | ICD-10-CM | POA: Diagnosis not present

## 2016-12-30 DIAGNOSIS — N183 Chronic kidney disease, stage 3 (moderate): Secondary | ICD-10-CM | POA: Diagnosis not present

## 2016-12-30 DIAGNOSIS — M6281 Muscle weakness (generalized): Secondary | ICD-10-CM | POA: Diagnosis not present

## 2016-12-30 DIAGNOSIS — R2689 Other abnormalities of gait and mobility: Secondary | ICD-10-CM | POA: Diagnosis not present

## 2016-12-30 DIAGNOSIS — E1129 Type 2 diabetes mellitus with other diabetic kidney complication: Secondary | ICD-10-CM | POA: Diagnosis not present

## 2016-12-30 DIAGNOSIS — N179 Acute kidney failure, unspecified: Secondary | ICD-10-CM | POA: Diagnosis not present

## 2016-12-30 DIAGNOSIS — R1313 Dysphagia, pharyngeal phase: Secondary | ICD-10-CM | POA: Diagnosis not present

## 2016-12-31 DIAGNOSIS — N179 Acute kidney failure, unspecified: Secondary | ICD-10-CM | POA: Diagnosis not present

## 2016-12-31 DIAGNOSIS — N183 Chronic kidney disease, stage 3 (moderate): Secondary | ICD-10-CM | POA: Diagnosis not present

## 2016-12-31 DIAGNOSIS — E1129 Type 2 diabetes mellitus with other diabetic kidney complication: Secondary | ICD-10-CM | POA: Diagnosis not present

## 2016-12-31 DIAGNOSIS — M6281 Muscle weakness (generalized): Secondary | ICD-10-CM | POA: Diagnosis not present

## 2016-12-31 DIAGNOSIS — R1313 Dysphagia, pharyngeal phase: Secondary | ICD-10-CM | POA: Diagnosis not present

## 2016-12-31 DIAGNOSIS — R2689 Other abnormalities of gait and mobility: Secondary | ICD-10-CM | POA: Diagnosis not present

## 2017-01-01 DIAGNOSIS — N179 Acute kidney failure, unspecified: Secondary | ICD-10-CM | POA: Diagnosis not present

## 2017-01-01 DIAGNOSIS — R1313 Dysphagia, pharyngeal phase: Secondary | ICD-10-CM | POA: Diagnosis not present

## 2017-01-01 DIAGNOSIS — R2689 Other abnormalities of gait and mobility: Secondary | ICD-10-CM | POA: Diagnosis not present

## 2017-01-01 DIAGNOSIS — N183 Chronic kidney disease, stage 3 (moderate): Secondary | ICD-10-CM | POA: Diagnosis not present

## 2017-01-01 DIAGNOSIS — M6281 Muscle weakness (generalized): Secondary | ICD-10-CM | POA: Diagnosis not present

## 2017-01-01 DIAGNOSIS — E1129 Type 2 diabetes mellitus with other diabetic kidney complication: Secondary | ICD-10-CM | POA: Diagnosis not present

## 2017-01-02 DIAGNOSIS — M6281 Muscle weakness (generalized): Secondary | ICD-10-CM | POA: Diagnosis not present

## 2017-01-02 DIAGNOSIS — N183 Chronic kidney disease, stage 3 (moderate): Secondary | ICD-10-CM | POA: Diagnosis not present

## 2017-01-02 DIAGNOSIS — N179 Acute kidney failure, unspecified: Secondary | ICD-10-CM | POA: Diagnosis not present

## 2017-01-02 DIAGNOSIS — R2689 Other abnormalities of gait and mobility: Secondary | ICD-10-CM | POA: Diagnosis not present

## 2017-01-02 DIAGNOSIS — R1313 Dysphagia, pharyngeal phase: Secondary | ICD-10-CM | POA: Diagnosis not present

## 2017-01-02 DIAGNOSIS — E1129 Type 2 diabetes mellitus with other diabetic kidney complication: Secondary | ICD-10-CM | POA: Diagnosis not present

## 2017-01-07 DIAGNOSIS — E1122 Type 2 diabetes mellitus with diabetic chronic kidney disease: Secondary | ICD-10-CM | POA: Diagnosis not present

## 2017-01-07 DIAGNOSIS — I10 Essential (primary) hypertension: Secondary | ICD-10-CM | POA: Diagnosis not present

## 2017-01-07 DIAGNOSIS — E1151 Type 2 diabetes mellitus with diabetic peripheral angiopathy without gangrene: Secondary | ICD-10-CM | POA: Diagnosis not present

## 2017-01-07 DIAGNOSIS — L84 Corns and callosities: Secondary | ICD-10-CM | POA: Diagnosis not present

## 2017-01-07 DIAGNOSIS — E568 Deficiency of other vitamins: Secondary | ICD-10-CM | POA: Diagnosis not present

## 2017-01-21 DIAGNOSIS — N390 Urinary tract infection, site not specified: Secondary | ICD-10-CM | POA: Diagnosis not present

## 2017-01-21 DIAGNOSIS — E559 Vitamin D deficiency, unspecified: Secondary | ICD-10-CM | POA: Diagnosis not present

## 2017-01-21 DIAGNOSIS — E785 Hyperlipidemia, unspecified: Secondary | ICD-10-CM | POA: Diagnosis not present

## 2017-01-21 DIAGNOSIS — E0800 Diabetes mellitus due to underlying condition with hyperosmolarity without nonketotic hyperglycemic-hyperosmolar coma (NKHHC): Secondary | ICD-10-CM | POA: Diagnosis not present

## 2017-01-21 DIAGNOSIS — E0822 Diabetes mellitus due to underlying condition with diabetic chronic kidney disease: Secondary | ICD-10-CM | POA: Diagnosis not present

## 2017-01-21 DIAGNOSIS — M10019 Idiopathic gout, unspecified shoulder: Secondary | ICD-10-CM | POA: Diagnosis not present

## 2017-01-23 DIAGNOSIS — M1A09X0 Idiopathic chronic gout, multiple sites, without tophus (tophi): Secondary | ICD-10-CM | POA: Diagnosis not present

## 2017-01-23 DIAGNOSIS — R7989 Other specified abnormal findings of blood chemistry: Secondary | ICD-10-CM | POA: Diagnosis not present

## 2017-01-23 DIAGNOSIS — I10 Essential (primary) hypertension: Secondary | ICD-10-CM | POA: Diagnosis not present

## 2017-02-10 DIAGNOSIS — M1049 Other secondary gout, multiple sites: Secondary | ICD-10-CM | POA: Diagnosis not present

## 2017-02-10 DIAGNOSIS — I10 Essential (primary) hypertension: Secondary | ICD-10-CM | POA: Diagnosis not present

## 2017-02-10 DIAGNOSIS — E1122 Type 2 diabetes mellitus with diabetic chronic kidney disease: Secondary | ICD-10-CM | POA: Diagnosis not present

## 2017-02-11 DIAGNOSIS — E0800 Diabetes mellitus due to underlying condition with hyperosmolarity without nonketotic hyperglycemic-hyperosmolar coma (NKHHC): Secondary | ICD-10-CM | POA: Diagnosis not present

## 2017-02-11 DIAGNOSIS — M10019 Idiopathic gout, unspecified shoulder: Secondary | ICD-10-CM | POA: Diagnosis not present

## 2017-02-11 DIAGNOSIS — E0822 Diabetes mellitus due to underlying condition with diabetic chronic kidney disease: Secondary | ICD-10-CM | POA: Diagnosis not present

## 2017-02-11 DIAGNOSIS — E785 Hyperlipidemia, unspecified: Secondary | ICD-10-CM | POA: Diagnosis not present

## 2017-02-11 DIAGNOSIS — E559 Vitamin D deficiency, unspecified: Secondary | ICD-10-CM | POA: Diagnosis not present

## 2017-02-11 DIAGNOSIS — N390 Urinary tract infection, site not specified: Secondary | ICD-10-CM | POA: Diagnosis not present

## 2017-02-20 DIAGNOSIS — I10 Essential (primary) hypertension: Secondary | ICD-10-CM | POA: Diagnosis not present

## 2017-02-20 DIAGNOSIS — M1A09X0 Idiopathic chronic gout, multiple sites, without tophus (tophi): Secondary | ICD-10-CM | POA: Diagnosis not present

## 2017-03-25 DIAGNOSIS — E1151 Type 2 diabetes mellitus with diabetic peripheral angiopathy without gangrene: Secondary | ICD-10-CM | POA: Diagnosis not present

## 2017-03-25 DIAGNOSIS — L84 Corns and callosities: Secondary | ICD-10-CM | POA: Diagnosis not present

## 2017-03-25 DIAGNOSIS — G8929 Other chronic pain: Secondary | ICD-10-CM | POA: Diagnosis not present

## 2017-03-30 DIAGNOSIS — R410 Disorientation, unspecified: Secondary | ICD-10-CM | POA: Diagnosis not present

## 2017-03-30 DIAGNOSIS — I10 Essential (primary) hypertension: Secondary | ICD-10-CM | POA: Diagnosis not present

## 2017-03-30 DIAGNOSIS — E559 Vitamin D deficiency, unspecified: Secondary | ICD-10-CM | POA: Diagnosis not present

## 2017-03-30 DIAGNOSIS — R1313 Dysphagia, pharyngeal phase: Secondary | ICD-10-CM | POA: Diagnosis not present

## 2017-03-30 DIAGNOSIS — R4189 Other symptoms and signs involving cognitive functions and awareness: Secondary | ICD-10-CM | POA: Diagnosis not present

## 2017-03-30 DIAGNOSIS — E1122 Type 2 diabetes mellitus with diabetic chronic kidney disease: Secondary | ICD-10-CM | POA: Diagnosis not present

## 2017-03-30 DIAGNOSIS — M25449 Effusion, unspecified hand: Secondary | ICD-10-CM | POA: Diagnosis not present

## 2017-03-30 DIAGNOSIS — N179 Acute kidney failure, unspecified: Secondary | ICD-10-CM | POA: Diagnosis not present

## 2017-03-30 DIAGNOSIS — Z789 Other specified health status: Secondary | ICD-10-CM | POA: Diagnosis not present

## 2017-03-30 DIAGNOSIS — R7989 Other specified abnormal findings of blood chemistry: Secondary | ICD-10-CM | POA: Diagnosis not present

## 2017-03-30 DIAGNOSIS — E785 Hyperlipidemia, unspecified: Secondary | ICD-10-CM | POA: Diagnosis not present

## 2017-03-30 DIAGNOSIS — E118 Type 2 diabetes mellitus with unspecified complications: Secondary | ICD-10-CM | POA: Diagnosis not present

## 2017-03-30 DIAGNOSIS — M109 Gout, unspecified: Secondary | ICD-10-CM | POA: Diagnosis not present

## 2017-03-30 DIAGNOSIS — G8929 Other chronic pain: Secondary | ICD-10-CM | POA: Diagnosis not present

## 2017-03-30 DIAGNOSIS — M10041 Idiopathic gout, right hand: Secondary | ICD-10-CM | POA: Diagnosis not present

## 2017-04-01 DIAGNOSIS — E785 Hyperlipidemia, unspecified: Secondary | ICD-10-CM | POA: Diagnosis not present

## 2017-04-01 DIAGNOSIS — N179 Acute kidney failure, unspecified: Secondary | ICD-10-CM | POA: Diagnosis not present

## 2017-04-01 DIAGNOSIS — G8929 Other chronic pain: Secondary | ICD-10-CM | POA: Diagnosis not present

## 2017-04-03 DIAGNOSIS — R4189 Other symptoms and signs involving cognitive functions and awareness: Secondary | ICD-10-CM | POA: Diagnosis not present

## 2017-04-06 DIAGNOSIS — N390 Urinary tract infection, site not specified: Secondary | ICD-10-CM | POA: Diagnosis not present

## 2017-04-06 DIAGNOSIS — R4189 Other symptoms and signs involving cognitive functions and awareness: Secondary | ICD-10-CM | POA: Diagnosis not present

## 2017-04-13 DIAGNOSIS — N390 Urinary tract infection, site not specified: Secondary | ICD-10-CM | POA: Diagnosis not present

## 2017-04-29 DIAGNOSIS — R300 Dysuria: Secondary | ICD-10-CM | POA: Diagnosis not present

## 2017-05-04 DIAGNOSIS — R300 Dysuria: Secondary | ICD-10-CM | POA: Diagnosis not present

## 2017-05-06 DIAGNOSIS — N390 Urinary tract infection, site not specified: Secondary | ICD-10-CM | POA: Diagnosis not present

## 2017-05-13 DIAGNOSIS — E1129 Type 2 diabetes mellitus with other diabetic kidney complication: Secondary | ICD-10-CM | POA: Diagnosis not present

## 2017-05-13 DIAGNOSIS — N218 Other lower urinary tract calculus: Secondary | ICD-10-CM | POA: Diagnosis not present

## 2017-05-13 DIAGNOSIS — M6281 Muscle weakness (generalized): Secondary | ICD-10-CM | POA: Diagnosis not present

## 2017-05-13 DIAGNOSIS — R2689 Other abnormalities of gait and mobility: Secondary | ICD-10-CM | POA: Diagnosis not present

## 2017-05-13 DIAGNOSIS — R1313 Dysphagia, pharyngeal phase: Secondary | ICD-10-CM | POA: Diagnosis not present

## 2017-05-13 DIAGNOSIS — N183 Chronic kidney disease, stage 3 (moderate): Secondary | ICD-10-CM | POA: Diagnosis not present

## 2017-05-13 DIAGNOSIS — N179 Acute kidney failure, unspecified: Secondary | ICD-10-CM | POA: Diagnosis not present

## 2017-05-14 DIAGNOSIS — N183 Chronic kidney disease, stage 3 (moderate): Secondary | ICD-10-CM | POA: Diagnosis not present

## 2017-05-14 DIAGNOSIS — M6281 Muscle weakness (generalized): Secondary | ICD-10-CM | POA: Diagnosis not present

## 2017-05-14 DIAGNOSIS — R1313 Dysphagia, pharyngeal phase: Secondary | ICD-10-CM | POA: Diagnosis not present

## 2017-05-14 DIAGNOSIS — R2689 Other abnormalities of gait and mobility: Secondary | ICD-10-CM | POA: Diagnosis not present

## 2017-05-14 DIAGNOSIS — N179 Acute kidney failure, unspecified: Secondary | ICD-10-CM | POA: Diagnosis not present

## 2017-05-14 DIAGNOSIS — E1129 Type 2 diabetes mellitus with other diabetic kidney complication: Secondary | ICD-10-CM | POA: Diagnosis not present

## 2017-05-15 DIAGNOSIS — M6281 Muscle weakness (generalized): Secondary | ICD-10-CM | POA: Diagnosis not present

## 2017-05-15 DIAGNOSIS — N179 Acute kidney failure, unspecified: Secondary | ICD-10-CM | POA: Diagnosis not present

## 2017-05-16 DIAGNOSIS — N179 Acute kidney failure, unspecified: Secondary | ICD-10-CM | POA: Diagnosis not present

## 2017-05-16 DIAGNOSIS — M6281 Muscle weakness (generalized): Secondary | ICD-10-CM | POA: Diagnosis not present

## 2017-05-18 DIAGNOSIS — Z789 Other specified health status: Secondary | ICD-10-CM | POA: Diagnosis not present

## 2017-05-18 DIAGNOSIS — R300 Dysuria: Secondary | ICD-10-CM | POA: Diagnosis not present

## 2017-05-18 DIAGNOSIS — N390 Urinary tract infection, site not specified: Secondary | ICD-10-CM | POA: Diagnosis not present

## 2017-05-18 DIAGNOSIS — N179 Acute kidney failure, unspecified: Secondary | ICD-10-CM | POA: Diagnosis not present

## 2017-05-18 DIAGNOSIS — M6281 Muscle weakness (generalized): Secondary | ICD-10-CM | POA: Diagnosis not present

## 2017-05-20 DIAGNOSIS — M6281 Muscle weakness (generalized): Secondary | ICD-10-CM | POA: Diagnosis not present

## 2017-05-20 DIAGNOSIS — N179 Acute kidney failure, unspecified: Secondary | ICD-10-CM | POA: Diagnosis not present

## 2017-05-21 DIAGNOSIS — N179 Acute kidney failure, unspecified: Secondary | ICD-10-CM | POA: Diagnosis not present

## 2017-05-21 DIAGNOSIS — M6281 Muscle weakness (generalized): Secondary | ICD-10-CM | POA: Diagnosis not present

## 2017-05-22 DIAGNOSIS — N179 Acute kidney failure, unspecified: Secondary | ICD-10-CM | POA: Diagnosis not present

## 2017-05-22 DIAGNOSIS — M6281 Muscle weakness (generalized): Secondary | ICD-10-CM | POA: Diagnosis not present

## 2017-05-23 DIAGNOSIS — N179 Acute kidney failure, unspecified: Secondary | ICD-10-CM | POA: Diagnosis not present

## 2017-05-23 DIAGNOSIS — M6281 Muscle weakness (generalized): Secondary | ICD-10-CM | POA: Diagnosis not present

## 2017-05-25 DIAGNOSIS — N179 Acute kidney failure, unspecified: Secondary | ICD-10-CM | POA: Diagnosis not present

## 2017-05-25 DIAGNOSIS — M6281 Muscle weakness (generalized): Secondary | ICD-10-CM | POA: Diagnosis not present

## 2017-05-26 DIAGNOSIS — M6281 Muscle weakness (generalized): Secondary | ICD-10-CM | POA: Diagnosis not present

## 2017-05-26 DIAGNOSIS — N179 Acute kidney failure, unspecified: Secondary | ICD-10-CM | POA: Diagnosis not present

## 2017-05-27 DIAGNOSIS — N179 Acute kidney failure, unspecified: Secondary | ICD-10-CM | POA: Diagnosis not present

## 2017-05-27 DIAGNOSIS — M6281 Muscle weakness (generalized): Secondary | ICD-10-CM | POA: Diagnosis not present

## 2017-05-28 DIAGNOSIS — N179 Acute kidney failure, unspecified: Secondary | ICD-10-CM | POA: Diagnosis not present

## 2017-05-28 DIAGNOSIS — M6281 Muscle weakness (generalized): Secondary | ICD-10-CM | POA: Diagnosis not present

## 2017-05-29 DIAGNOSIS — M6281 Muscle weakness (generalized): Secondary | ICD-10-CM | POA: Diagnosis not present

## 2017-05-29 DIAGNOSIS — N179 Acute kidney failure, unspecified: Secondary | ICD-10-CM | POA: Diagnosis not present

## 2017-06-01 DIAGNOSIS — M6281 Muscle weakness (generalized): Secondary | ICD-10-CM | POA: Diagnosis not present

## 2017-06-01 DIAGNOSIS — N179 Acute kidney failure, unspecified: Secondary | ICD-10-CM | POA: Diagnosis not present

## 2017-06-02 DIAGNOSIS — M6281 Muscle weakness (generalized): Secondary | ICD-10-CM | POA: Diagnosis not present

## 2017-06-02 DIAGNOSIS — N179 Acute kidney failure, unspecified: Secondary | ICD-10-CM | POA: Diagnosis not present

## 2017-06-12 DIAGNOSIS — E1122 Type 2 diabetes mellitus with diabetic chronic kidney disease: Secondary | ICD-10-CM | POA: Diagnosis not present

## 2017-06-12 DIAGNOSIS — I10 Essential (primary) hypertension: Secondary | ICD-10-CM | POA: Diagnosis not present

## 2017-06-13 DIAGNOSIS — R293 Abnormal posture: Secondary | ICD-10-CM | POA: Diagnosis not present

## 2017-06-13 DIAGNOSIS — N179 Acute kidney failure, unspecified: Secondary | ICD-10-CM | POA: Diagnosis not present

## 2017-06-15 DIAGNOSIS — N179 Acute kidney failure, unspecified: Secondary | ICD-10-CM | POA: Diagnosis not present

## 2017-06-15 DIAGNOSIS — R293 Abnormal posture: Secondary | ICD-10-CM | POA: Diagnosis not present

## 2017-06-16 DIAGNOSIS — N179 Acute kidney failure, unspecified: Secondary | ICD-10-CM | POA: Diagnosis not present

## 2017-06-16 DIAGNOSIS — R293 Abnormal posture: Secondary | ICD-10-CM | POA: Diagnosis not present

## 2017-06-18 DIAGNOSIS — N179 Acute kidney failure, unspecified: Secondary | ICD-10-CM | POA: Diagnosis not present

## 2017-06-18 DIAGNOSIS — R293 Abnormal posture: Secondary | ICD-10-CM | POA: Diagnosis not present

## 2017-06-22 DIAGNOSIS — R293 Abnormal posture: Secondary | ICD-10-CM | POA: Diagnosis not present

## 2017-06-22 DIAGNOSIS — N179 Acute kidney failure, unspecified: Secondary | ICD-10-CM | POA: Diagnosis not present

## 2017-06-24 DIAGNOSIS — N179 Acute kidney failure, unspecified: Secondary | ICD-10-CM | POA: Diagnosis not present

## 2017-06-24 DIAGNOSIS — R293 Abnormal posture: Secondary | ICD-10-CM | POA: Diagnosis not present

## 2017-07-01 DIAGNOSIS — R293 Abnormal posture: Secondary | ICD-10-CM | POA: Diagnosis not present

## 2017-07-01 DIAGNOSIS — N179 Acute kidney failure, unspecified: Secondary | ICD-10-CM | POA: Diagnosis not present

## 2017-07-02 DIAGNOSIS — N179 Acute kidney failure, unspecified: Secondary | ICD-10-CM | POA: Diagnosis not present

## 2017-07-02 DIAGNOSIS — R293 Abnormal posture: Secondary | ICD-10-CM | POA: Diagnosis not present

## 2017-07-03 DIAGNOSIS — R293 Abnormal posture: Secondary | ICD-10-CM | POA: Diagnosis not present

## 2017-07-03 DIAGNOSIS — N179 Acute kidney failure, unspecified: Secondary | ICD-10-CM | POA: Diagnosis not present

## 2017-07-06 DIAGNOSIS — N179 Acute kidney failure, unspecified: Secondary | ICD-10-CM | POA: Diagnosis not present

## 2017-07-06 DIAGNOSIS — R293 Abnormal posture: Secondary | ICD-10-CM | POA: Diagnosis not present

## 2017-07-08 DIAGNOSIS — N179 Acute kidney failure, unspecified: Secondary | ICD-10-CM | POA: Diagnosis not present

## 2017-07-08 DIAGNOSIS — R293 Abnormal posture: Secondary | ICD-10-CM | POA: Diagnosis not present

## 2017-07-11 DIAGNOSIS — R293 Abnormal posture: Secondary | ICD-10-CM | POA: Diagnosis not present

## 2017-07-11 DIAGNOSIS — N179 Acute kidney failure, unspecified: Secondary | ICD-10-CM | POA: Diagnosis not present

## 2017-07-13 DIAGNOSIS — R293 Abnormal posture: Secondary | ICD-10-CM | POA: Diagnosis not present

## 2017-07-13 DIAGNOSIS — N179 Acute kidney failure, unspecified: Secondary | ICD-10-CM | POA: Diagnosis not present

## 2017-07-15 DIAGNOSIS — R293 Abnormal posture: Secondary | ICD-10-CM | POA: Diagnosis not present

## 2017-07-15 DIAGNOSIS — N179 Acute kidney failure, unspecified: Secondary | ICD-10-CM | POA: Diagnosis not present

## 2017-07-17 DIAGNOSIS — N179 Acute kidney failure, unspecified: Secondary | ICD-10-CM | POA: Diagnosis not present

## 2017-07-17 DIAGNOSIS — R293 Abnormal posture: Secondary | ICD-10-CM | POA: Diagnosis not present

## 2017-07-21 DIAGNOSIS — N179 Acute kidney failure, unspecified: Secondary | ICD-10-CM | POA: Diagnosis not present

## 2017-07-21 DIAGNOSIS — R293 Abnormal posture: Secondary | ICD-10-CM | POA: Diagnosis not present

## 2017-07-22 DIAGNOSIS — N179 Acute kidney failure, unspecified: Secondary | ICD-10-CM | POA: Diagnosis not present

## 2017-07-22 DIAGNOSIS — R293 Abnormal posture: Secondary | ICD-10-CM | POA: Diagnosis not present

## 2017-07-25 DIAGNOSIS — R293 Abnormal posture: Secondary | ICD-10-CM | POA: Diagnosis not present

## 2017-07-25 DIAGNOSIS — N179 Acute kidney failure, unspecified: Secondary | ICD-10-CM | POA: Diagnosis not present

## 2017-07-27 DIAGNOSIS — R293 Abnormal posture: Secondary | ICD-10-CM | POA: Diagnosis not present

## 2017-07-27 DIAGNOSIS — N179 Acute kidney failure, unspecified: Secondary | ICD-10-CM | POA: Diagnosis not present

## 2017-07-28 DIAGNOSIS — N179 Acute kidney failure, unspecified: Secondary | ICD-10-CM | POA: Diagnosis not present

## 2017-07-28 DIAGNOSIS — R293 Abnormal posture: Secondary | ICD-10-CM | POA: Diagnosis not present

## 2017-08-05 DIAGNOSIS — M1049 Other secondary gout, multiple sites: Secondary | ICD-10-CM | POA: Diagnosis not present

## 2017-08-05 DIAGNOSIS — R293 Abnormal posture: Secondary | ICD-10-CM | POA: Diagnosis not present

## 2017-08-05 DIAGNOSIS — N184 Chronic kidney disease, stage 4 (severe): Secondary | ICD-10-CM | POA: Diagnosis not present

## 2017-08-05 DIAGNOSIS — N179 Acute kidney failure, unspecified: Secondary | ICD-10-CM | POA: Diagnosis not present

## 2017-08-05 DIAGNOSIS — I129 Hypertensive chronic kidney disease with stage 1 through stage 4 chronic kidney disease, or unspecified chronic kidney disease: Secondary | ICD-10-CM | POA: Diagnosis not present

## 2017-08-05 DIAGNOSIS — E1122 Type 2 diabetes mellitus with diabetic chronic kidney disease: Secondary | ICD-10-CM | POA: Diagnosis not present

## 2017-08-06 DIAGNOSIS — N179 Acute kidney failure, unspecified: Secondary | ICD-10-CM | POA: Diagnosis not present

## 2017-08-06 DIAGNOSIS — R293 Abnormal posture: Secondary | ICD-10-CM | POA: Diagnosis not present

## 2017-08-07 DIAGNOSIS — N179 Acute kidney failure, unspecified: Secondary | ICD-10-CM | POA: Diagnosis not present

## 2017-08-07 DIAGNOSIS — G8929 Other chronic pain: Secondary | ICD-10-CM | POA: Diagnosis not present

## 2017-08-07 DIAGNOSIS — M10019 Idiopathic gout, unspecified shoulder: Secondary | ICD-10-CM | POA: Diagnosis not present

## 2017-08-07 DIAGNOSIS — E785 Hyperlipidemia, unspecified: Secondary | ICD-10-CM | POA: Diagnosis not present

## 2017-08-07 DIAGNOSIS — E559 Vitamin D deficiency, unspecified: Secondary | ICD-10-CM | POA: Diagnosis not present

## 2017-08-07 DIAGNOSIS — E0822 Diabetes mellitus due to underlying condition with diabetic chronic kidney disease: Secondary | ICD-10-CM | POA: Diagnosis not present

## 2017-08-07 DIAGNOSIS — E0800 Diabetes mellitus due to underlying condition with hyperosmolarity without nonketotic hyperglycemic-hyperosmolar coma (NKHHC): Secondary | ICD-10-CM | POA: Diagnosis not present

## 2017-08-07 DIAGNOSIS — N218 Other lower urinary tract calculus: Secondary | ICD-10-CM | POA: Diagnosis not present

## 2017-08-10 DIAGNOSIS — M1049 Other secondary gout, multiple sites: Secondary | ICD-10-CM | POA: Diagnosis not present

## 2017-08-10 DIAGNOSIS — M25511 Pain in right shoulder: Secondary | ICD-10-CM | POA: Diagnosis not present

## 2017-08-11 DIAGNOSIS — N179 Acute kidney failure, unspecified: Secondary | ICD-10-CM | POA: Diagnosis not present

## 2017-08-11 DIAGNOSIS — R293 Abnormal posture: Secondary | ICD-10-CM | POA: Diagnosis not present

## 2017-08-12 DIAGNOSIS — R293 Abnormal posture: Secondary | ICD-10-CM | POA: Diagnosis not present

## 2017-08-12 DIAGNOSIS — N179 Acute kidney failure, unspecified: Secondary | ICD-10-CM | POA: Diagnosis not present

## 2017-08-14 DIAGNOSIS — R293 Abnormal posture: Secondary | ICD-10-CM | POA: Diagnosis not present

## 2017-08-14 DIAGNOSIS — N179 Acute kidney failure, unspecified: Secondary | ICD-10-CM | POA: Diagnosis not present

## 2017-08-17 DIAGNOSIS — N179 Acute kidney failure, unspecified: Secondary | ICD-10-CM | POA: Diagnosis not present

## 2017-08-17 DIAGNOSIS — R293 Abnormal posture: Secondary | ICD-10-CM | POA: Diagnosis not present

## 2017-08-20 DIAGNOSIS — N179 Acute kidney failure, unspecified: Secondary | ICD-10-CM | POA: Diagnosis not present

## 2017-08-20 DIAGNOSIS — R293 Abnormal posture: Secondary | ICD-10-CM | POA: Diagnosis not present

## 2017-08-21 DIAGNOSIS — R293 Abnormal posture: Secondary | ICD-10-CM | POA: Diagnosis not present

## 2017-08-21 DIAGNOSIS — N179 Acute kidney failure, unspecified: Secondary | ICD-10-CM | POA: Diagnosis not present

## 2017-08-24 DIAGNOSIS — R293 Abnormal posture: Secondary | ICD-10-CM | POA: Diagnosis not present

## 2017-08-24 DIAGNOSIS — N179 Acute kidney failure, unspecified: Secondary | ICD-10-CM | POA: Diagnosis not present

## 2017-08-25 DIAGNOSIS — N179 Acute kidney failure, unspecified: Secondary | ICD-10-CM | POA: Diagnosis not present

## 2017-08-25 DIAGNOSIS — R293 Abnormal posture: Secondary | ICD-10-CM | POA: Diagnosis not present

## 2017-08-27 DIAGNOSIS — R293 Abnormal posture: Secondary | ICD-10-CM | POA: Diagnosis not present

## 2017-08-27 DIAGNOSIS — N179 Acute kidney failure, unspecified: Secondary | ICD-10-CM | POA: Diagnosis not present

## 2017-08-31 DIAGNOSIS — R293 Abnormal posture: Secondary | ICD-10-CM | POA: Diagnosis not present

## 2017-08-31 DIAGNOSIS — N179 Acute kidney failure, unspecified: Secondary | ICD-10-CM | POA: Diagnosis not present

## 2017-09-02 DIAGNOSIS — N179 Acute kidney failure, unspecified: Secondary | ICD-10-CM | POA: Diagnosis not present

## 2017-09-02 DIAGNOSIS — R293 Abnormal posture: Secondary | ICD-10-CM | POA: Diagnosis not present

## 2017-09-03 DIAGNOSIS — R293 Abnormal posture: Secondary | ICD-10-CM | POA: Diagnosis not present

## 2017-09-03 DIAGNOSIS — N179 Acute kidney failure, unspecified: Secondary | ICD-10-CM | POA: Diagnosis not present

## 2017-09-08 DIAGNOSIS — N179 Acute kidney failure, unspecified: Secondary | ICD-10-CM | POA: Diagnosis not present

## 2017-09-08 DIAGNOSIS — R293 Abnormal posture: Secondary | ICD-10-CM | POA: Diagnosis not present

## 2017-09-09 DIAGNOSIS — R293 Abnormal posture: Secondary | ICD-10-CM | POA: Diagnosis not present

## 2017-09-09 DIAGNOSIS — N179 Acute kidney failure, unspecified: Secondary | ICD-10-CM | POA: Diagnosis not present

## 2017-09-11 DIAGNOSIS — R293 Abnormal posture: Secondary | ICD-10-CM | POA: Diagnosis not present

## 2017-09-11 DIAGNOSIS — N179 Acute kidney failure, unspecified: Secondary | ICD-10-CM | POA: Diagnosis not present

## 2017-09-15 DIAGNOSIS — R293 Abnormal posture: Secondary | ICD-10-CM | POA: Diagnosis not present

## 2017-09-15 DIAGNOSIS — N179 Acute kidney failure, unspecified: Secondary | ICD-10-CM | POA: Diagnosis not present

## 2017-09-16 DIAGNOSIS — N179 Acute kidney failure, unspecified: Secondary | ICD-10-CM | POA: Diagnosis not present

## 2017-09-16 DIAGNOSIS — R293 Abnormal posture: Secondary | ICD-10-CM | POA: Diagnosis not present

## 2017-09-18 DIAGNOSIS — R293 Abnormal posture: Secondary | ICD-10-CM | POA: Diagnosis not present

## 2017-09-18 DIAGNOSIS — N179 Acute kidney failure, unspecified: Secondary | ICD-10-CM | POA: Diagnosis not present

## 2017-09-18 IMAGING — US US RENAL
1 series · 14 of 25 positions shown · non-contrast
Comparison: Renal ultrasound 02/07/2016

CLINICAL DATA: Acute kidney injury.  Hypercalcemia.

EXAM:
RENAL / URINARY TRACT ULTRASOUND COMPLETE

[Series 1: us renal · 0.23mm/px · 14 of 25 slices shown]
[im 1/25]
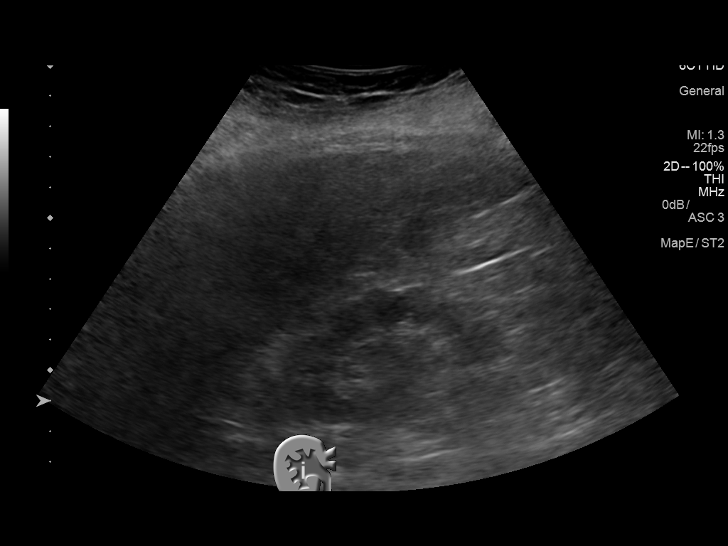
[im 3/25]
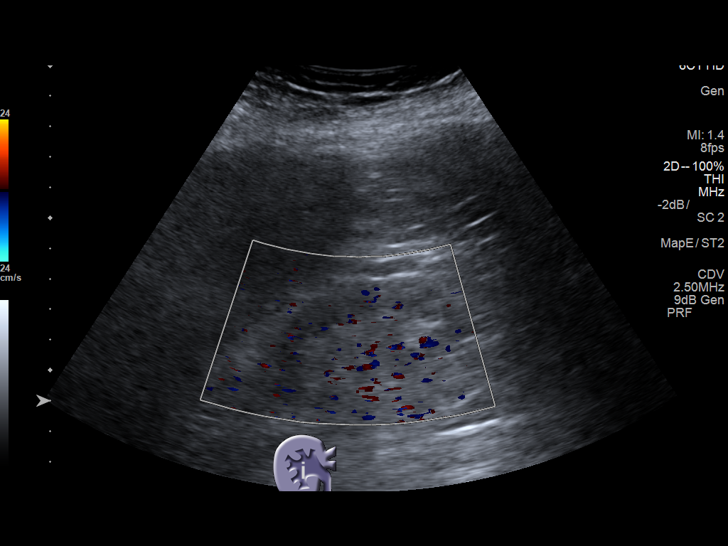
[im 5/25]
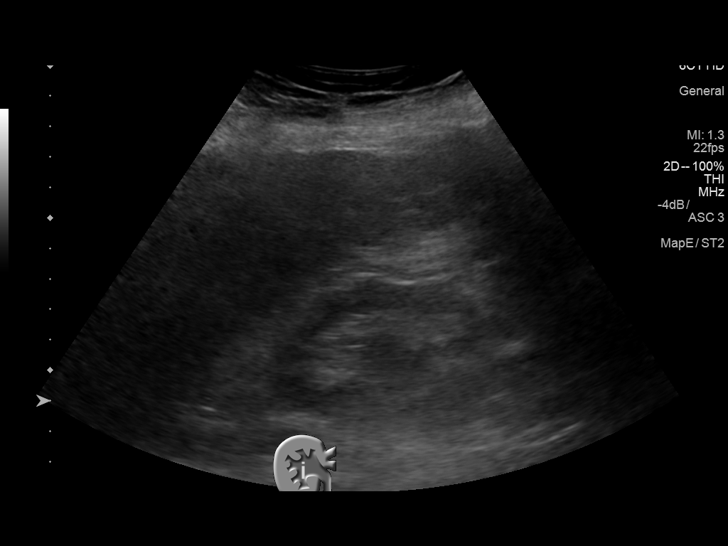
[im 7/25]
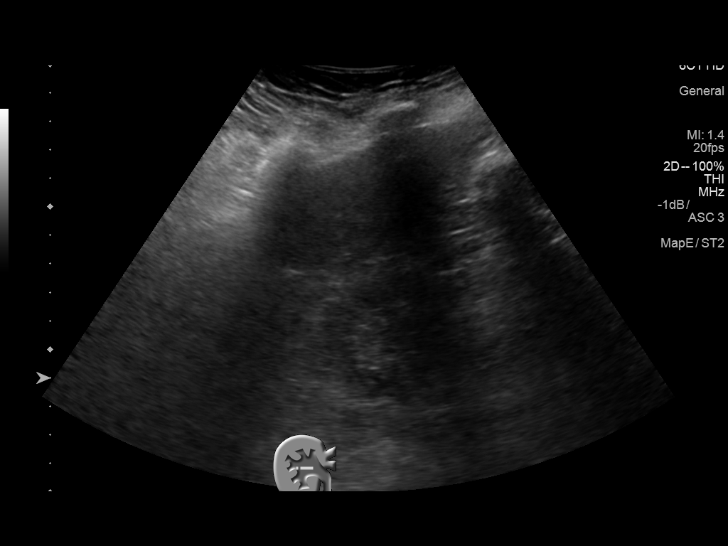
[im 9/25]
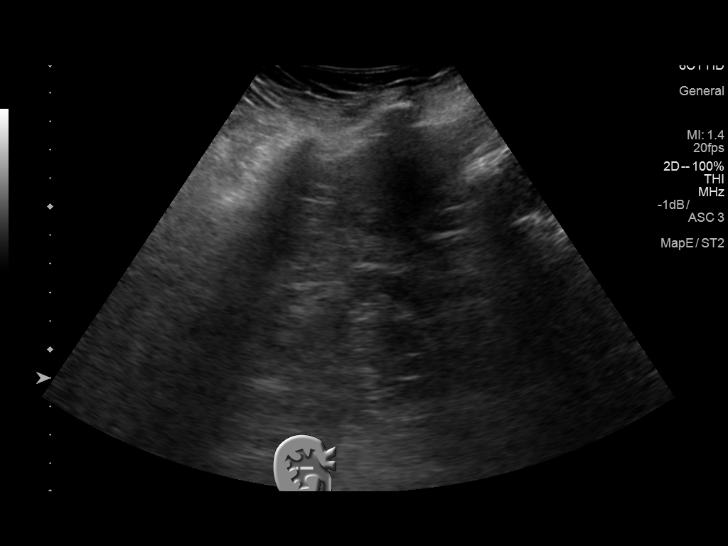
[im 10/25]
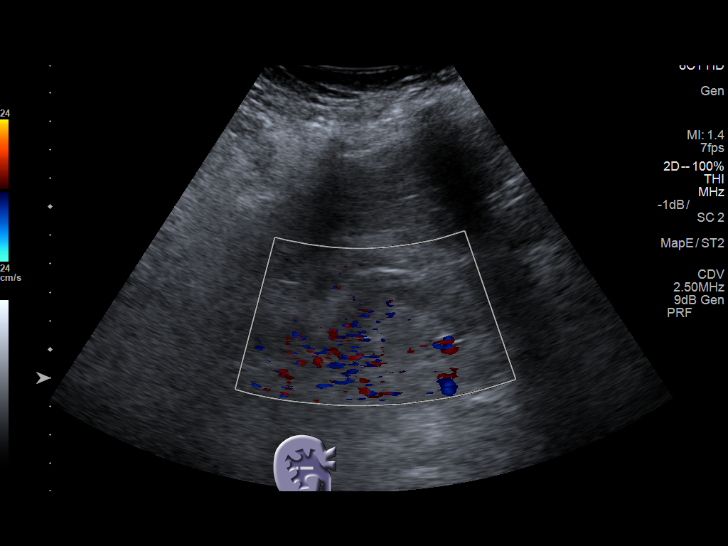
[im 12/25]
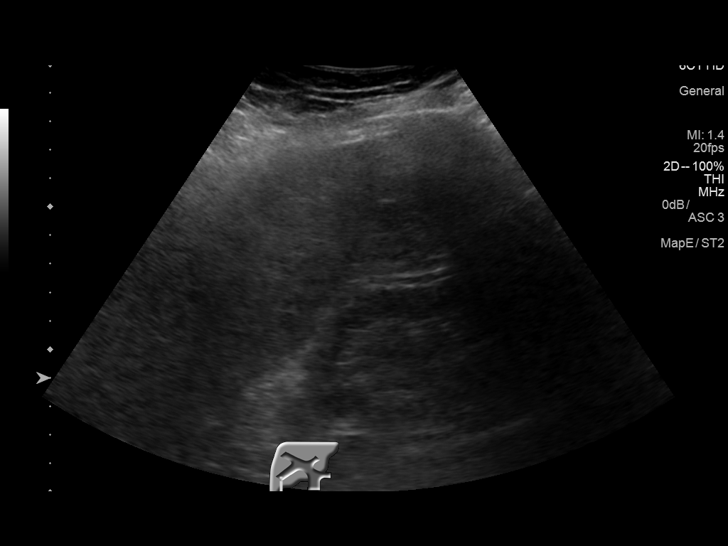
[im 14/25]
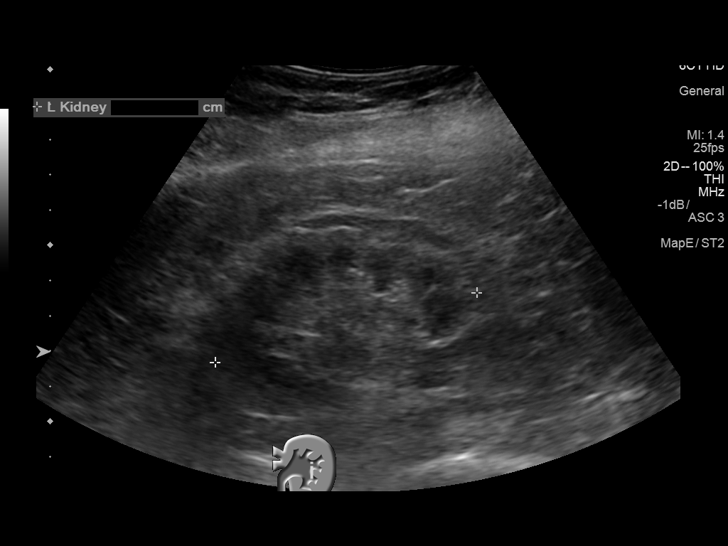
[im 16/25]
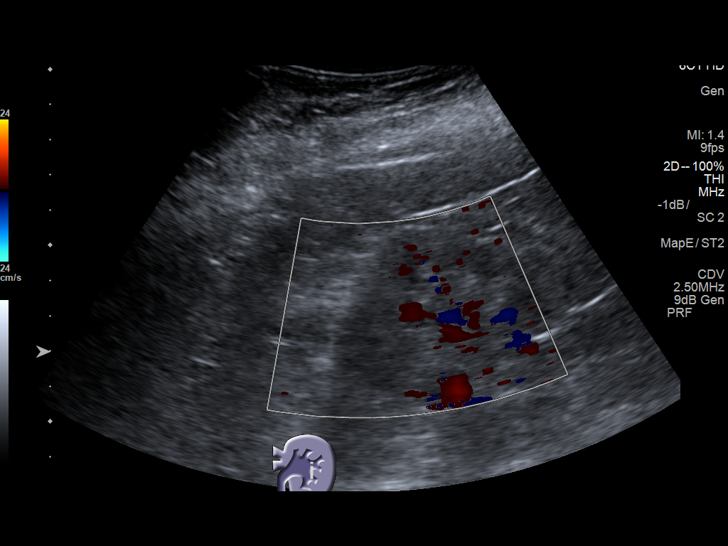
[im 17/25]
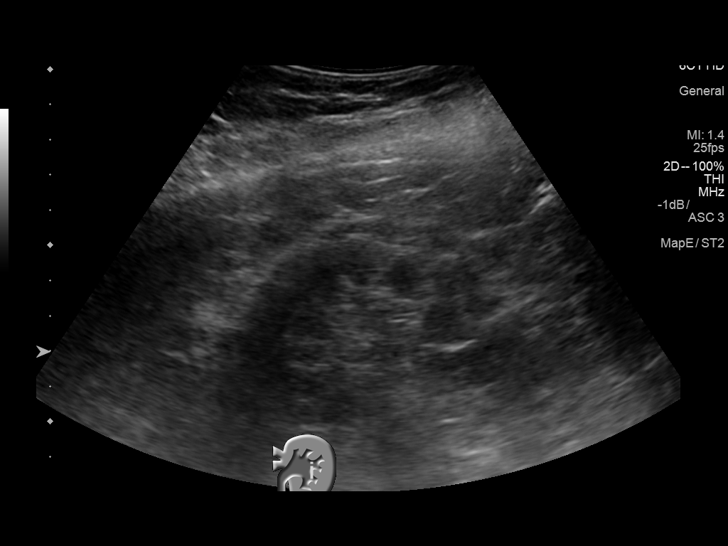
[im 19/25]
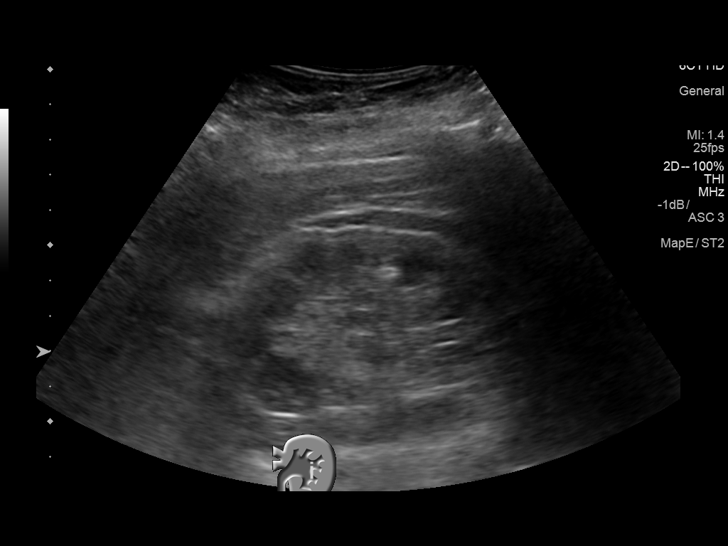
[im 21/25]
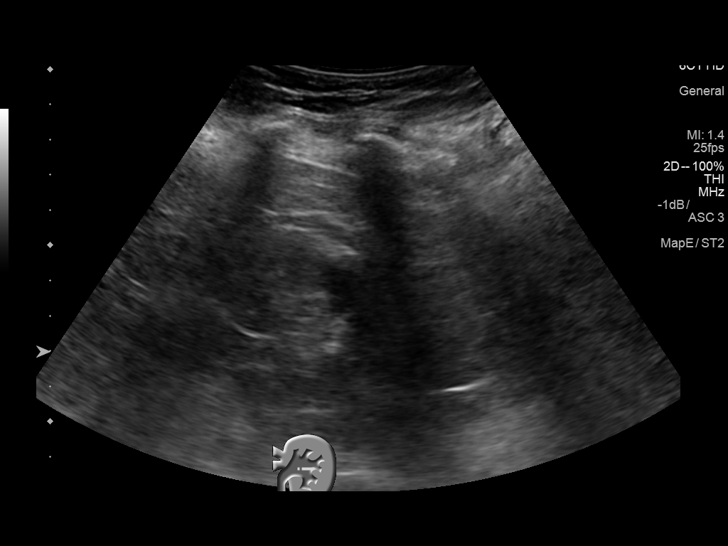
[im 23/25]
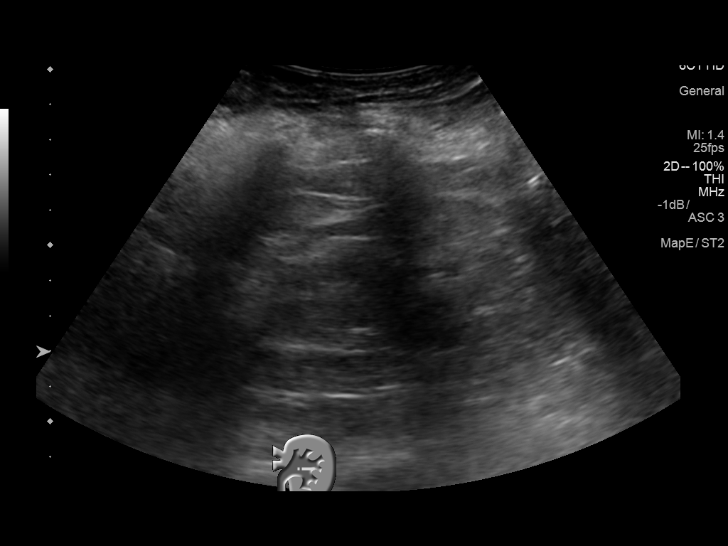
[im 25/25]
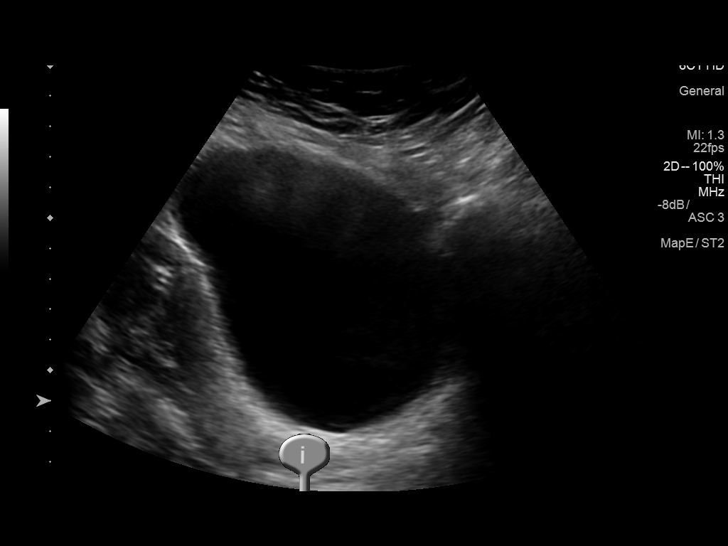

[14 of 25 positions shown; findings below may reference images not displayed]

FINDINGS: Right Kidney:

Length: 7.8 cm. There is renal cortical atrophy and thinning of
renal parenchyma, increased renal echogenicity. Pelvic fullness
appear similar to prior exam. No frank hydronephrosis.

Left Kidney:

Length: 7.7 cm. There is renal cortical atrophy and thinning of
renal parenchyma, increased renal echogenicity. No hydronephrosis.

Bladder:

Appears normal for degree of bladder distention. Layering debris on
prior exam is no longer present.
IMPRESSION: 1. No hydronephrosis or obstructive uropathy.
2. Renal cortical thinning with increased renal echogenicity
consistent with chronic medical renal disease.

## 2017-10-16 DIAGNOSIS — N184 Chronic kidney disease, stage 4 (severe): Secondary | ICD-10-CM | POA: Diagnosis not present

## 2017-10-16 DIAGNOSIS — I129 Hypertensive chronic kidney disease with stage 1 through stage 4 chronic kidney disease, or unspecified chronic kidney disease: Secondary | ICD-10-CM | POA: Diagnosis not present

## 2017-10-16 DIAGNOSIS — M25511 Pain in right shoulder: Secondary | ICD-10-CM | POA: Diagnosis not present

## 2017-10-16 DIAGNOSIS — E1122 Type 2 diabetes mellitus with diabetic chronic kidney disease: Secondary | ICD-10-CM | POA: Diagnosis not present

## 2017-10-25 DIAGNOSIS — R0989 Other specified symptoms and signs involving the circulatory and respiratory systems: Secondary | ICD-10-CM | POA: Diagnosis not present

## 2017-10-26 DIAGNOSIS — H43821 Vitreomacular adhesion, right eye: Secondary | ICD-10-CM | POA: Diagnosis not present

## 2017-10-26 DIAGNOSIS — J028 Acute pharyngitis due to other specified organisms: Secondary | ICD-10-CM | POA: Diagnosis not present

## 2017-10-26 DIAGNOSIS — H10521 Angular blepharoconjunctivitis, right eye: Secondary | ICD-10-CM | POA: Diagnosis not present

## 2017-10-26 DIAGNOSIS — E113393 Type 2 diabetes mellitus with moderate nonproliferative diabetic retinopathy without macular edema, bilateral: Secondary | ICD-10-CM | POA: Diagnosis not present

## 2017-10-26 DIAGNOSIS — H10522 Angular blepharoconjunctivitis, left eye: Secondary | ICD-10-CM | POA: Diagnosis not present

## 2017-10-26 DIAGNOSIS — J060 Acute laryngopharyngitis: Secondary | ICD-10-CM | POA: Diagnosis not present

## 2017-11-06 DIAGNOSIS — R300 Dysuria: Secondary | ICD-10-CM | POA: Diagnosis not present

## 2017-11-06 DIAGNOSIS — R7309 Other abnormal glucose: Secondary | ICD-10-CM | POA: Diagnosis not present

## 2017-11-09 DIAGNOSIS — E1122 Type 2 diabetes mellitus with diabetic chronic kidney disease: Secondary | ICD-10-CM | POA: Diagnosis not present

## 2017-11-09 DIAGNOSIS — E0800 Diabetes mellitus due to underlying condition with hyperosmolarity without nonketotic hyperglycemic-hyperosmolar coma (NKHHC): Secondary | ICD-10-CM | POA: Diagnosis not present

## 2017-11-13 DIAGNOSIS — M1A09X0 Idiopathic chronic gout, multiple sites, without tophus (tophi): Secondary | ICD-10-CM | POA: Diagnosis not present

## 2017-11-13 DIAGNOSIS — N390 Urinary tract infection, site not specified: Secondary | ICD-10-CM | POA: Diagnosis not present

## 2017-11-13 DIAGNOSIS — E1122 Type 2 diabetes mellitus with diabetic chronic kidney disease: Secondary | ICD-10-CM | POA: Diagnosis not present

## 2017-11-13 DIAGNOSIS — M25511 Pain in right shoulder: Secondary | ICD-10-CM | POA: Diagnosis not present

## 2017-11-13 DIAGNOSIS — R300 Dysuria: Secondary | ICD-10-CM | POA: Diagnosis not present

## 2017-11-13 DIAGNOSIS — R293 Abnormal posture: Secondary | ICD-10-CM | POA: Diagnosis not present

## 2017-11-13 DIAGNOSIS — E559 Vitamin D deficiency, unspecified: Secondary | ICD-10-CM | POA: Diagnosis not present

## 2017-11-13 DIAGNOSIS — E785 Hyperlipidemia, unspecified: Secondary | ICD-10-CM | POA: Diagnosis not present

## 2017-11-13 DIAGNOSIS — N179 Acute kidney failure, unspecified: Secondary | ICD-10-CM | POA: Diagnosis not present

## 2017-11-13 DIAGNOSIS — R739 Hyperglycemia, unspecified: Secondary | ICD-10-CM | POA: Diagnosis not present

## 2017-11-13 DIAGNOSIS — G8929 Other chronic pain: Secondary | ICD-10-CM | POA: Diagnosis not present

## 2017-11-13 DIAGNOSIS — R410 Disorientation, unspecified: Secondary | ICD-10-CM | POA: Diagnosis not present

## 2017-11-25 DIAGNOSIS — M6281 Muscle weakness (generalized): Secondary | ICD-10-CM | POA: Diagnosis not present

## 2017-11-25 DIAGNOSIS — N179 Acute kidney failure, unspecified: Secondary | ICD-10-CM | POA: Diagnosis not present

## 2017-11-26 DIAGNOSIS — N179 Acute kidney failure, unspecified: Secondary | ICD-10-CM | POA: Diagnosis not present

## 2017-11-26 DIAGNOSIS — M6281 Muscle weakness (generalized): Secondary | ICD-10-CM | POA: Diagnosis not present

## 2017-11-27 DIAGNOSIS — R300 Dysuria: Secondary | ICD-10-CM | POA: Diagnosis not present

## 2017-11-27 DIAGNOSIS — M6281 Muscle weakness (generalized): Secondary | ICD-10-CM | POA: Diagnosis not present

## 2017-11-27 DIAGNOSIS — N179 Acute kidney failure, unspecified: Secondary | ICD-10-CM | POA: Diagnosis not present

## 2017-11-27 DIAGNOSIS — E1122 Type 2 diabetes mellitus with diabetic chronic kidney disease: Secondary | ICD-10-CM | POA: Diagnosis not present

## 2017-11-30 DIAGNOSIS — N179 Acute kidney failure, unspecified: Secondary | ICD-10-CM | POA: Diagnosis not present

## 2017-11-30 DIAGNOSIS — M6281 Muscle weakness (generalized): Secondary | ICD-10-CM | POA: Diagnosis not present

## 2017-11-30 DIAGNOSIS — E1122 Type 2 diabetes mellitus with diabetic chronic kidney disease: Secondary | ICD-10-CM | POA: Diagnosis not present

## 2017-11-30 DIAGNOSIS — N390 Urinary tract infection, site not specified: Secondary | ICD-10-CM | POA: Diagnosis not present

## 2017-11-30 DIAGNOSIS — A498 Other bacterial infections of unspecified site: Secondary | ICD-10-CM | POA: Diagnosis not present

## 2017-12-01 DIAGNOSIS — M6281 Muscle weakness (generalized): Secondary | ICD-10-CM | POA: Diagnosis not present

## 2017-12-01 DIAGNOSIS — N179 Acute kidney failure, unspecified: Secondary | ICD-10-CM | POA: Diagnosis not present

## 2017-12-02 DIAGNOSIS — M6281 Muscle weakness (generalized): Secondary | ICD-10-CM | POA: Diagnosis not present

## 2017-12-02 DIAGNOSIS — N179 Acute kidney failure, unspecified: Secondary | ICD-10-CM | POA: Diagnosis not present

## 2017-12-03 DIAGNOSIS — N179 Acute kidney failure, unspecified: Secondary | ICD-10-CM | POA: Diagnosis not present

## 2017-12-03 DIAGNOSIS — M6281 Muscle weakness (generalized): Secondary | ICD-10-CM | POA: Diagnosis not present

## 2017-12-04 DIAGNOSIS — N179 Acute kidney failure, unspecified: Secondary | ICD-10-CM | POA: Diagnosis not present

## 2017-12-04 DIAGNOSIS — M6281 Muscle weakness (generalized): Secondary | ICD-10-CM | POA: Diagnosis not present

## 2017-12-07 DIAGNOSIS — M6281 Muscle weakness (generalized): Secondary | ICD-10-CM | POA: Diagnosis not present

## 2017-12-07 DIAGNOSIS — N179 Acute kidney failure, unspecified: Secondary | ICD-10-CM | POA: Diagnosis not present

## 2017-12-08 DIAGNOSIS — N179 Acute kidney failure, unspecified: Secondary | ICD-10-CM | POA: Diagnosis not present

## 2017-12-08 DIAGNOSIS — M6281 Muscle weakness (generalized): Secondary | ICD-10-CM | POA: Diagnosis not present

## 2017-12-09 DIAGNOSIS — E568 Deficiency of other vitamins: Secondary | ICD-10-CM | POA: Diagnosis not present

## 2017-12-09 DIAGNOSIS — M1049 Other secondary gout, multiple sites: Secondary | ICD-10-CM | POA: Diagnosis not present

## 2017-12-09 DIAGNOSIS — N179 Acute kidney failure, unspecified: Secondary | ICD-10-CM | POA: Diagnosis not present

## 2017-12-09 DIAGNOSIS — E1122 Type 2 diabetes mellitus with diabetic chronic kidney disease: Secondary | ICD-10-CM | POA: Diagnosis not present

## 2017-12-09 DIAGNOSIS — I129 Hypertensive chronic kidney disease with stage 1 through stage 4 chronic kidney disease, or unspecified chronic kidney disease: Secondary | ICD-10-CM | POA: Diagnosis not present

## 2017-12-09 DIAGNOSIS — M6281 Muscle weakness (generalized): Secondary | ICD-10-CM | POA: Diagnosis not present

## 2017-12-10 DIAGNOSIS — M6281 Muscle weakness (generalized): Secondary | ICD-10-CM | POA: Diagnosis not present

## 2017-12-10 DIAGNOSIS — N179 Acute kidney failure, unspecified: Secondary | ICD-10-CM | POA: Diagnosis not present

## 2017-12-11 DIAGNOSIS — N179 Acute kidney failure, unspecified: Secondary | ICD-10-CM | POA: Diagnosis not present

## 2017-12-11 DIAGNOSIS — M6281 Muscle weakness (generalized): Secondary | ICD-10-CM | POA: Diagnosis not present

## 2017-12-14 DIAGNOSIS — M6281 Muscle weakness (generalized): Secondary | ICD-10-CM | POA: Diagnosis not present

## 2017-12-14 DIAGNOSIS — N179 Acute kidney failure, unspecified: Secondary | ICD-10-CM | POA: Diagnosis not present

## 2017-12-15 DIAGNOSIS — M6281 Muscle weakness (generalized): Secondary | ICD-10-CM | POA: Diagnosis not present

## 2017-12-15 DIAGNOSIS — N179 Acute kidney failure, unspecified: Secondary | ICD-10-CM | POA: Diagnosis not present

## 2017-12-16 DIAGNOSIS — N179 Acute kidney failure, unspecified: Secondary | ICD-10-CM | POA: Diagnosis not present

## 2017-12-16 DIAGNOSIS — I129 Hypertensive chronic kidney disease with stage 1 through stage 4 chronic kidney disease, or unspecified chronic kidney disease: Secondary | ICD-10-CM | POA: Diagnosis not present

## 2017-12-16 DIAGNOSIS — M6281 Muscle weakness (generalized): Secondary | ICD-10-CM | POA: Diagnosis not present

## 2017-12-17 DIAGNOSIS — M6281 Muscle weakness (generalized): Secondary | ICD-10-CM | POA: Diagnosis not present

## 2017-12-17 DIAGNOSIS — N179 Acute kidney failure, unspecified: Secondary | ICD-10-CM | POA: Diagnosis not present

## 2017-12-25 DIAGNOSIS — E1122 Type 2 diabetes mellitus with diabetic chronic kidney disease: Secondary | ICD-10-CM | POA: Diagnosis not present

## 2018-01-12 DIAGNOSIS — R4182 Altered mental status, unspecified: Secondary | ICD-10-CM | POA: Diagnosis not present

## 2018-01-12 DIAGNOSIS — J189 Pneumonia, unspecified organism: Secondary | ICD-10-CM | POA: Diagnosis not present

## 2018-01-12 DIAGNOSIS — I517 Cardiomegaly: Secondary | ICD-10-CM | POA: Diagnosis not present

## 2018-01-13 DIAGNOSIS — E1122 Type 2 diabetes mellitus with diabetic chronic kidney disease: Secondary | ICD-10-CM | POA: Diagnosis not present

## 2018-01-13 DIAGNOSIS — J188 Other pneumonia, unspecified organism: Secondary | ICD-10-CM | POA: Diagnosis not present

## 2018-01-13 DIAGNOSIS — Y95 Nosocomial condition: Secondary | ICD-10-CM | POA: Diagnosis not present

## 2018-01-13 DIAGNOSIS — N184 Chronic kidney disease, stage 4 (severe): Secondary | ICD-10-CM | POA: Diagnosis not present

## 2018-01-15 DIAGNOSIS — J188 Other pneumonia, unspecified organism: Secondary | ICD-10-CM | POA: Diagnosis not present

## 2018-01-18 DIAGNOSIS — J188 Other pneumonia, unspecified organism: Secondary | ICD-10-CM | POA: Diagnosis not present

## 2018-01-20 DIAGNOSIS — N179 Acute kidney failure, unspecified: Secondary | ICD-10-CM | POA: Diagnosis not present

## 2018-01-20 DIAGNOSIS — F05 Delirium due to known physiological condition: Secondary | ICD-10-CM | POA: Diagnosis not present

## 2018-01-20 DIAGNOSIS — R1310 Dysphagia, unspecified: Secondary | ICD-10-CM | POA: Diagnosis not present

## 2018-01-22 DIAGNOSIS — E0829 Diabetes mellitus due to underlying condition with other diabetic kidney complication: Secondary | ICD-10-CM | POA: Diagnosis not present

## 2018-02-05 DIAGNOSIS — R293 Abnormal posture: Secondary | ICD-10-CM | POA: Diagnosis not present

## 2018-02-05 DIAGNOSIS — N179 Acute kidney failure, unspecified: Secondary | ICD-10-CM | POA: Diagnosis not present

## 2018-02-05 DIAGNOSIS — R488 Other symbolic dysfunctions: Secondary | ICD-10-CM | POA: Diagnosis not present

## 2018-02-05 DIAGNOSIS — M6281 Muscle weakness (generalized): Secondary | ICD-10-CM | POA: Diagnosis not present

## 2018-02-06 DIAGNOSIS — M6281 Muscle weakness (generalized): Secondary | ICD-10-CM | POA: Diagnosis not present

## 2018-02-06 DIAGNOSIS — R488 Other symbolic dysfunctions: Secondary | ICD-10-CM | POA: Diagnosis not present

## 2018-02-06 DIAGNOSIS — R293 Abnormal posture: Secondary | ICD-10-CM | POA: Diagnosis not present

## 2018-02-06 DIAGNOSIS — N179 Acute kidney failure, unspecified: Secondary | ICD-10-CM | POA: Diagnosis not present

## 2018-02-08 DIAGNOSIS — R293 Abnormal posture: Secondary | ICD-10-CM | POA: Diagnosis not present

## 2018-02-08 DIAGNOSIS — N179 Acute kidney failure, unspecified: Secondary | ICD-10-CM | POA: Diagnosis not present

## 2018-02-08 DIAGNOSIS — R488 Other symbolic dysfunctions: Secondary | ICD-10-CM | POA: Diagnosis not present

## 2018-02-08 DIAGNOSIS — M6281 Muscle weakness (generalized): Secondary | ICD-10-CM | POA: Diagnosis not present

## 2018-02-09 DIAGNOSIS — R488 Other symbolic dysfunctions: Secondary | ICD-10-CM | POA: Diagnosis not present

## 2018-02-09 DIAGNOSIS — N179 Acute kidney failure, unspecified: Secondary | ICD-10-CM | POA: Diagnosis not present

## 2018-02-09 DIAGNOSIS — R293 Abnormal posture: Secondary | ICD-10-CM | POA: Diagnosis not present

## 2018-02-09 DIAGNOSIS — M6281 Muscle weakness (generalized): Secondary | ICD-10-CM | POA: Diagnosis not present

## 2018-02-10 DIAGNOSIS — N179 Acute kidney failure, unspecified: Secondary | ICD-10-CM | POA: Diagnosis not present

## 2018-02-10 DIAGNOSIS — M6281 Muscle weakness (generalized): Secondary | ICD-10-CM | POA: Diagnosis not present

## 2018-02-10 DIAGNOSIS — R488 Other symbolic dysfunctions: Secondary | ICD-10-CM | POA: Diagnosis not present

## 2018-02-10 DIAGNOSIS — R293 Abnormal posture: Secondary | ICD-10-CM | POA: Diagnosis not present

## 2018-02-11 DIAGNOSIS — R293 Abnormal posture: Secondary | ICD-10-CM | POA: Diagnosis not present

## 2018-02-11 DIAGNOSIS — R488 Other symbolic dysfunctions: Secondary | ICD-10-CM | POA: Diagnosis not present

## 2018-02-11 DIAGNOSIS — M6281 Muscle weakness (generalized): Secondary | ICD-10-CM | POA: Diagnosis not present

## 2018-02-11 DIAGNOSIS — N179 Acute kidney failure, unspecified: Secondary | ICD-10-CM | POA: Diagnosis not present

## 2018-02-12 DIAGNOSIS — N179 Acute kidney failure, unspecified: Secondary | ICD-10-CM | POA: Diagnosis not present

## 2018-02-12 DIAGNOSIS — M6281 Muscle weakness (generalized): Secondary | ICD-10-CM | POA: Diagnosis not present

## 2018-02-12 DIAGNOSIS — R293 Abnormal posture: Secondary | ICD-10-CM | POA: Diagnosis not present

## 2018-02-12 DIAGNOSIS — R488 Other symbolic dysfunctions: Secondary | ICD-10-CM | POA: Diagnosis not present

## 2018-02-14 DIAGNOSIS — R293 Abnormal posture: Secondary | ICD-10-CM | POA: Diagnosis not present

## 2018-02-14 DIAGNOSIS — R488 Other symbolic dysfunctions: Secondary | ICD-10-CM | POA: Diagnosis not present

## 2018-02-14 DIAGNOSIS — N179 Acute kidney failure, unspecified: Secondary | ICD-10-CM | POA: Diagnosis not present

## 2018-02-14 DIAGNOSIS — M6281 Muscle weakness (generalized): Secondary | ICD-10-CM | POA: Diagnosis not present

## 2018-02-15 DIAGNOSIS — N184 Chronic kidney disease, stage 4 (severe): Secondary | ICD-10-CM | POA: Diagnosis not present

## 2018-02-15 DIAGNOSIS — M1049 Other secondary gout, multiple sites: Secondary | ICD-10-CM | POA: Diagnosis not present

## 2018-02-15 DIAGNOSIS — R293 Abnormal posture: Secondary | ICD-10-CM | POA: Diagnosis not present

## 2018-02-15 DIAGNOSIS — M6281 Muscle weakness (generalized): Secondary | ICD-10-CM | POA: Diagnosis not present

## 2018-02-15 DIAGNOSIS — E1122 Type 2 diabetes mellitus with diabetic chronic kidney disease: Secondary | ICD-10-CM | POA: Diagnosis not present

## 2018-02-15 DIAGNOSIS — N179 Acute kidney failure, unspecified: Secondary | ICD-10-CM | POA: Diagnosis not present

## 2018-02-15 DIAGNOSIS — R488 Other symbolic dysfunctions: Secondary | ICD-10-CM | POA: Diagnosis not present

## 2018-02-15 DIAGNOSIS — I129 Hypertensive chronic kidney disease with stage 1 through stage 4 chronic kidney disease, or unspecified chronic kidney disease: Secondary | ICD-10-CM | POA: Diagnosis not present

## 2018-02-16 DIAGNOSIS — R488 Other symbolic dysfunctions: Secondary | ICD-10-CM | POA: Diagnosis not present

## 2018-02-16 DIAGNOSIS — N179 Acute kidney failure, unspecified: Secondary | ICD-10-CM | POA: Diagnosis not present

## 2018-02-16 DIAGNOSIS — M6281 Muscle weakness (generalized): Secondary | ICD-10-CM | POA: Diagnosis not present

## 2018-02-16 DIAGNOSIS — R293 Abnormal posture: Secondary | ICD-10-CM | POA: Diagnosis not present

## 2018-02-17 DIAGNOSIS — R488 Other symbolic dysfunctions: Secondary | ICD-10-CM | POA: Diagnosis not present

## 2018-02-17 DIAGNOSIS — N179 Acute kidney failure, unspecified: Secondary | ICD-10-CM | POA: Diagnosis not present

## 2018-02-17 DIAGNOSIS — M6281 Muscle weakness (generalized): Secondary | ICD-10-CM | POA: Diagnosis not present

## 2018-02-17 DIAGNOSIS — R293 Abnormal posture: Secondary | ICD-10-CM | POA: Diagnosis not present

## 2018-02-19 DIAGNOSIS — M6281 Muscle weakness (generalized): Secondary | ICD-10-CM | POA: Diagnosis not present

## 2018-02-19 DIAGNOSIS — R488 Other symbolic dysfunctions: Secondary | ICD-10-CM | POA: Diagnosis not present

## 2018-02-19 DIAGNOSIS — R293 Abnormal posture: Secondary | ICD-10-CM | POA: Diagnosis not present

## 2018-02-19 DIAGNOSIS — N179 Acute kidney failure, unspecified: Secondary | ICD-10-CM | POA: Diagnosis not present

## 2018-02-20 DIAGNOSIS — R488 Other symbolic dysfunctions: Secondary | ICD-10-CM | POA: Diagnosis not present

## 2018-02-20 DIAGNOSIS — R293 Abnormal posture: Secondary | ICD-10-CM | POA: Diagnosis not present

## 2018-02-20 DIAGNOSIS — N179 Acute kidney failure, unspecified: Secondary | ICD-10-CM | POA: Diagnosis not present

## 2018-02-20 DIAGNOSIS — M6281 Muscle weakness (generalized): Secondary | ICD-10-CM | POA: Diagnosis not present

## 2018-02-21 DIAGNOSIS — M6281 Muscle weakness (generalized): Secondary | ICD-10-CM | POA: Diagnosis not present

## 2018-02-21 DIAGNOSIS — N179 Acute kidney failure, unspecified: Secondary | ICD-10-CM | POA: Diagnosis not present

## 2018-02-21 DIAGNOSIS — R488 Other symbolic dysfunctions: Secondary | ICD-10-CM | POA: Diagnosis not present

## 2018-02-21 DIAGNOSIS — R293 Abnormal posture: Secondary | ICD-10-CM | POA: Diagnosis not present

## 2018-02-22 DIAGNOSIS — R293 Abnormal posture: Secondary | ICD-10-CM | POA: Diagnosis not present

## 2018-02-22 DIAGNOSIS — M6281 Muscle weakness (generalized): Secondary | ICD-10-CM | POA: Diagnosis not present

## 2018-02-22 DIAGNOSIS — N179 Acute kidney failure, unspecified: Secondary | ICD-10-CM | POA: Diagnosis not present

## 2018-02-22 DIAGNOSIS — R488 Other symbolic dysfunctions: Secondary | ICD-10-CM | POA: Diagnosis not present

## 2018-02-23 DIAGNOSIS — R293 Abnormal posture: Secondary | ICD-10-CM | POA: Diagnosis not present

## 2018-02-23 DIAGNOSIS — R488 Other symbolic dysfunctions: Secondary | ICD-10-CM | POA: Diagnosis not present

## 2018-02-23 DIAGNOSIS — M6281 Muscle weakness (generalized): Secondary | ICD-10-CM | POA: Diagnosis not present

## 2018-02-23 DIAGNOSIS — N179 Acute kidney failure, unspecified: Secondary | ICD-10-CM | POA: Diagnosis not present

## 2018-02-24 DIAGNOSIS — R488 Other symbolic dysfunctions: Secondary | ICD-10-CM | POA: Diagnosis not present

## 2018-02-24 DIAGNOSIS — M6281 Muscle weakness (generalized): Secondary | ICD-10-CM | POA: Diagnosis not present

## 2018-02-24 DIAGNOSIS — R293 Abnormal posture: Secondary | ICD-10-CM | POA: Diagnosis not present

## 2018-02-24 DIAGNOSIS — N179 Acute kidney failure, unspecified: Secondary | ICD-10-CM | POA: Diagnosis not present

## 2018-02-25 DIAGNOSIS — R293 Abnormal posture: Secondary | ICD-10-CM | POA: Diagnosis not present

## 2018-02-25 DIAGNOSIS — R488 Other symbolic dysfunctions: Secondary | ICD-10-CM | POA: Diagnosis not present

## 2018-02-25 DIAGNOSIS — M6281 Muscle weakness (generalized): Secondary | ICD-10-CM | POA: Diagnosis not present

## 2018-02-25 DIAGNOSIS — N179 Acute kidney failure, unspecified: Secondary | ICD-10-CM | POA: Diagnosis not present

## 2018-02-26 DIAGNOSIS — R293 Abnormal posture: Secondary | ICD-10-CM | POA: Diagnosis not present

## 2018-02-26 DIAGNOSIS — M6281 Muscle weakness (generalized): Secondary | ICD-10-CM | POA: Diagnosis not present

## 2018-02-26 DIAGNOSIS — R488 Other symbolic dysfunctions: Secondary | ICD-10-CM | POA: Diagnosis not present

## 2018-02-26 DIAGNOSIS — N179 Acute kidney failure, unspecified: Secondary | ICD-10-CM | POA: Diagnosis not present

## 2018-03-01 DIAGNOSIS — R293 Abnormal posture: Secondary | ICD-10-CM | POA: Diagnosis not present

## 2018-03-01 DIAGNOSIS — N179 Acute kidney failure, unspecified: Secondary | ICD-10-CM | POA: Diagnosis not present

## 2018-03-01 DIAGNOSIS — M6281 Muscle weakness (generalized): Secondary | ICD-10-CM | POA: Diagnosis not present

## 2018-03-01 DIAGNOSIS — R488 Other symbolic dysfunctions: Secondary | ICD-10-CM | POA: Diagnosis not present

## 2018-03-02 DIAGNOSIS — M6281 Muscle weakness (generalized): Secondary | ICD-10-CM | POA: Diagnosis not present

## 2018-03-02 DIAGNOSIS — R293 Abnormal posture: Secondary | ICD-10-CM | POA: Diagnosis not present

## 2018-03-02 DIAGNOSIS — R488 Other symbolic dysfunctions: Secondary | ICD-10-CM | POA: Diagnosis not present

## 2018-03-02 DIAGNOSIS — N179 Acute kidney failure, unspecified: Secondary | ICD-10-CM | POA: Diagnosis not present

## 2018-03-03 DIAGNOSIS — N179 Acute kidney failure, unspecified: Secondary | ICD-10-CM | POA: Diagnosis not present

## 2018-03-03 DIAGNOSIS — M6281 Muscle weakness (generalized): Secondary | ICD-10-CM | POA: Diagnosis not present

## 2018-03-03 DIAGNOSIS — R488 Other symbolic dysfunctions: Secondary | ICD-10-CM | POA: Diagnosis not present

## 2018-03-03 DIAGNOSIS — R293 Abnormal posture: Secondary | ICD-10-CM | POA: Diagnosis not present

## 2018-03-04 DIAGNOSIS — N179 Acute kidney failure, unspecified: Secondary | ICD-10-CM | POA: Diagnosis not present

## 2018-03-04 DIAGNOSIS — R293 Abnormal posture: Secondary | ICD-10-CM | POA: Diagnosis not present

## 2018-03-04 DIAGNOSIS — M6281 Muscle weakness (generalized): Secondary | ICD-10-CM | POA: Diagnosis not present

## 2018-03-04 DIAGNOSIS — R488 Other symbolic dysfunctions: Secondary | ICD-10-CM | POA: Diagnosis not present

## 2018-03-05 DIAGNOSIS — M6281 Muscle weakness (generalized): Secondary | ICD-10-CM | POA: Diagnosis not present

## 2018-03-05 DIAGNOSIS — N179 Acute kidney failure, unspecified: Secondary | ICD-10-CM | POA: Diagnosis not present

## 2018-03-05 DIAGNOSIS — R293 Abnormal posture: Secondary | ICD-10-CM | POA: Diagnosis not present

## 2018-03-05 DIAGNOSIS — E1122 Type 2 diabetes mellitus with diabetic chronic kidney disease: Secondary | ICD-10-CM | POA: Diagnosis not present

## 2018-03-05 DIAGNOSIS — R488 Other symbolic dysfunctions: Secondary | ICD-10-CM | POA: Diagnosis not present

## 2018-03-10 DIAGNOSIS — E0800 Diabetes mellitus due to underlying condition with hyperosmolarity without nonketotic hyperglycemic-hyperosmolar coma (NKHHC): Secondary | ICD-10-CM | POA: Diagnosis not present

## 2018-03-18 ENCOUNTER — Encounter (HOSPITAL_COMMUNITY): Payer: Self-pay

## 2018-03-18 ENCOUNTER — Other Ambulatory Visit: Payer: Self-pay

## 2018-03-18 ENCOUNTER — Observation Stay (HOSPITAL_COMMUNITY)
Admission: EM | Admit: 2018-03-18 | Discharge: 2018-03-21 | Disposition: A | Discharge status: Skilled Nursing Facility | Payer: Medicare Other | Attending: Internal Medicine | Admitting: Internal Medicine

## 2018-03-18 DIAGNOSIS — E1122 Type 2 diabetes mellitus with diabetic chronic kidney disease: Secondary | ICD-10-CM | POA: Insufficient documentation

## 2018-03-18 DIAGNOSIS — J90 Pleural effusion, not elsewhere classified: Secondary | ICD-10-CM | POA: Insufficient documentation

## 2018-03-18 DIAGNOSIS — K7460 Unspecified cirrhosis of liver: Secondary | ICD-10-CM | POA: Insufficient documentation

## 2018-03-18 DIAGNOSIS — M4316 Spondylolisthesis, lumbar region: Secondary | ICD-10-CM | POA: Diagnosis not present

## 2018-03-18 DIAGNOSIS — K920 Hematemesis: Secondary | ICD-10-CM

## 2018-03-18 DIAGNOSIS — E1129 Type 2 diabetes mellitus with other diabetic kidney complication: Secondary | ICD-10-CM | POA: Diagnosis present

## 2018-03-18 DIAGNOSIS — M8580 Other specified disorders of bone density and structure, unspecified site: Secondary | ICD-10-CM | POA: Insufficient documentation

## 2018-03-18 DIAGNOSIS — Z7982 Long term (current) use of aspirin: Secondary | ICD-10-CM | POA: Insufficient documentation

## 2018-03-18 DIAGNOSIS — M109 Gout, unspecified: Secondary | ICD-10-CM | POA: Diagnosis not present

## 2018-03-18 DIAGNOSIS — R110 Nausea: Secondary | ICD-10-CM | POA: Diagnosis not present

## 2018-03-18 DIAGNOSIS — K922 Gastrointestinal hemorrhage, unspecified: Principal | ICD-10-CM | POA: Diagnosis present

## 2018-03-18 DIAGNOSIS — I129 Hypertensive chronic kidney disease with stage 1 through stage 4 chronic kidney disease, or unspecified chronic kidney disease: Secondary | ICD-10-CM | POA: Insufficient documentation

## 2018-03-18 DIAGNOSIS — D259 Leiomyoma of uterus, unspecified: Secondary | ICD-10-CM | POA: Insufficient documentation

## 2018-03-18 DIAGNOSIS — N183 Chronic kidney disease, stage 3 unspecified: Secondary | ICD-10-CM | POA: Diagnosis present

## 2018-03-18 DIAGNOSIS — F0390 Unspecified dementia without behavioral disturbance: Secondary | ICD-10-CM | POA: Diagnosis not present

## 2018-03-18 DIAGNOSIS — J9811 Atelectasis: Secondary | ICD-10-CM | POA: Insufficient documentation

## 2018-03-18 DIAGNOSIS — Z8249 Family history of ischemic heart disease and other diseases of the circulatory system: Secondary | ICD-10-CM | POA: Insufficient documentation

## 2018-03-18 DIAGNOSIS — Z79899 Other long term (current) drug therapy: Secondary | ICD-10-CM | POA: Insufficient documentation

## 2018-03-18 DIAGNOSIS — Z66 Do not resuscitate: Secondary | ICD-10-CM | POA: Diagnosis not present

## 2018-03-18 DIAGNOSIS — Z794 Long term (current) use of insulin: Secondary | ICD-10-CM | POA: Insufficient documentation

## 2018-03-18 DIAGNOSIS — R100 Acute abdomen: Secondary | ICD-10-CM | POA: Diagnosis not present

## 2018-03-18 DIAGNOSIS — K5730 Diverticulosis of large intestine without perforation or abscess without bleeding: Secondary | ICD-10-CM | POA: Insufficient documentation

## 2018-03-18 DIAGNOSIS — Z87891 Personal history of nicotine dependence: Secondary | ICD-10-CM | POA: Insufficient documentation

## 2018-03-18 DIAGNOSIS — R1111 Vomiting without nausea: Secondary | ICD-10-CM | POA: Diagnosis not present

## 2018-03-18 LAB — COMPREHENSIVE METABOLIC PANEL
ALBUMIN: 3.8 g/dL (ref 3.5–5.0)
ALK PHOS: 55 U/L (ref 38–126)
ALT: 11 U/L (ref 0–44)
ANION GAP: 11 (ref 5–15)
AST: 30 U/L (ref 15–41)
BUN: 55 mg/dL — ABNORMAL HIGH (ref 8–23)
CALCIUM: 9.3 mg/dL (ref 8.9–10.3)
CO2: 21 mmol/L — AB (ref 22–32)
Chloride: 109 mmol/L (ref 98–111)
Creatinine, Ser: 1.55 mg/dL — ABNORMAL HIGH (ref 0.44–1.00)
GFR calc non Af Amer: 27 mL/min — ABNORMAL LOW (ref >60)
GFR, EST AFRICAN AMERICAN: 32 mL/min — AB (ref >60)
GLUCOSE: 116 mg/dL — AB (ref 70–99)
POTASSIUM: 4.3 mmol/L (ref 3.5–5.1)
SODIUM: 141 mmol/L (ref 135–145)
TOTAL PROTEIN: 7.2 g/dL (ref 6.5–8.1)
Total Bilirubin: 0.7 mg/dL (ref 0.3–1.2)

## 2018-03-18 LAB — CBC
HCT: 38.9 % (ref 36.0–46.0)
HEMOGLOBIN: 12.5 g/dL (ref 12.0–15.0)
MCH: 30.4 pg (ref 26.0–34.0)
MCHC: 32.1 g/dL (ref 30.0–36.0)
MCV: 94.6 fL (ref 80.0–100.0)
NRBC: 0 % (ref 0.0–0.2)
PLATELETS: 171 10*3/uL (ref 150–400)
RBC: 4.11 MIL/uL (ref 3.87–5.11)
RDW: 13.3 % (ref 11.5–15.5)
WBC: 9.6 10*3/uL (ref 4.0–10.5)

## 2018-03-18 LAB — PROTIME-INR
INR: 1.02
PROTHROMBIN TIME: 13.3 s (ref 11.4–15.2)

## 2018-03-18 LAB — POC OCCULT BLOOD, ED: FECAL OCCULT BLD: POSITIVE — AB

## 2018-03-18 LAB — APTT: APTT: 26 s (ref 24–36)

## 2018-03-18 MED ORDER — ONDANSETRON HCL 4 MG/2ML IJ SOLN
4.0000 mg | Freq: Once | INTRAMUSCULAR | Status: AC
Start: 2018-03-18 — End: 2018-03-18
  Administered 2018-03-18: 4 mg via INTRAVENOUS
  Filled 2018-03-18: qty 2

## 2018-03-18 NOTE — ED Provider Notes (Signed)
TIME SEEN: 11:28 PM  CHIEF COMPLAINT: Vomiting, abdominal pain  HPI: Patient is 84 year old female with history of diabetes, hypertension who presents to the emergency department with vomiting and lower abdominal pain.  Patient comes from [facility] facility where they state they saw coffee-ground emesis today.  Patient is a very poor historian.  No known diarrhea, bloody stool or melena.  She is not sure if she is on any anticoagulation or antiplatelets.  Unable to tell me history of any surgical procedures to her abdomen in the past.  ROS: See HPI Constitutional: no fever  Eyes: no drainage  ENT: no runny nose   Cardiovascular:  no chest pain  Resp: no SOB  GI:  vomiting GU: no dysuria Integumentary: no rash  Allergy: no hives  Musculoskeletal: no leg swelling  Neurological: no slurred speech ROS otherwise negative  PAST MEDICAL HISTORY/PAST SURGICAL HISTORY:  Past Medical History:  Diagnosis Date  . Diabetes ([place])    SLOW TO HEAL  . Gout   . HBP (high blood pressure)   . Kidney disease   . Poor circulation     MEDICATIONS:  Prior to Admission medications   Medication Sig Start Date End Date Taking? Authorizing Provider  acetaminophen (TYLENOL) 500 MG tablet Take 1,000 mg by mouth every 8 (eight) hours as needed for moderate pain.     [provider]  Amino Acids-Protein Hydrolys (FEEDING SUPPLEMENT, PRO-STAT SUGAR FREE 64,) LIQD Take 30 mLs by mouth 2 (two) times daily.    [provider]  amLODipine (NORVASC) 10 MG tablet Take 10 mg by mouth daily.  08/09/14   [provider]  aspirin EC 81 MG tablet Take 81 mg by mouth daily.    [provider]  atorvastatin (LIPITOR) 40 MG tablet Take 40 mg by mouth daily.  09/24/13   [provider]  ergocalciferol (VITAMIN D2) 50000 units capsule Take 50,000 Units by mouth 2 (two) times daily.    [provider]  guaiFENesin-dextromethorphan (ROBITUSSIN DM) 100-10 MG/5ML  syrup Take 5 mLs by mouth every 4 (four) hours as needed for cough. 04/16/16   Annita Brod, MD  magnesium oxide (MAG-OX) 400 (241.3 Mg) MG tablet Take 1 tablet (400 mg total) by mouth 2 (two) times daily. 02/26/16   Raiford Noble Latif, DO  metoprolol succinate (TOPROL-XL) 100 MG 24 hr tablet Take 100 mg by mouth daily. Take with or immediately following a meal.    [provider]  Multiple Vitamins-Minerals (OCUVITE EXTRA) TABS Take 1 tablet by mouth daily.    [provider]  Nutritional Supplements (NUTRITIONAL DRINK PO) Take 120 mLs by mouth 3 (three) times daily.    [provider]  vitamin C (ASCORBIC ACID) 500 MG tablet Take 500 mg by mouth 2 (two) times daily.    [provider]    ALLERGIES:  No Known Allergies  SOCIAL HISTORY:  Social History   Tobacco Use  . Smoking status: Former Smoker    Types: Cigarettes    Last attempt to quit: 01/19/1973    Years since quitting: 45.1  . Smokeless tobacco: Never Used  Substance Use Topics  . Alcohol use: No    FAMILY HISTORY: Family History  Problem Relation Age of Onset  . Diabetes Mother   . Hypertension Brother   . Cancer Neg Hx     EXAM: BP 140/68 (BP Location: Left Arm)   Pulse 64   Temp 97.8 F (36.6 C) (Oral)  Resp (!) 22   SpO2 100%  CONSTITUTIONAL: Alert and oriented and responds appropriately to questions.  Elderly, afebrile HEAD: Normocephalic EYES: Conjunctivae clear, pupils appear equal, EOMI, no conjunctival pallor ENT: normal nose; moist mucous membranes NECK: Supple, no meningismus, no nuchal rigidity, no LAD  CARD: RRR; S1 and S2 appreciated; no murmurs, no clicks, no rubs, no gallops RESP: Normal chest excursion without splinting or tachypnea; breath sounds clear and equal bilaterally; no wheezes, no rhonchi, no rales, no hypoxia or respiratory distress, speaking full sentences ABD/GI: Normal bowel sounds; non-distended; soft, mildly tender throughout the lower  abdomen, no rebound, no guarding, no peritoneal signs, no hepatosplenomegaly RECTAL:  Normal rectal tone, no gross blood or melena, guaiac positive, no hemorrhoids appreciated, nontender rectal exam, no fecal impaction BACK:  The back appears normal and is non-tender to palpation, there is no CVA tenderness EXT: Normal ROM in all joints; non-tender to palpation; no edema; normal capillary refill; no cyanosis, no calf tenderness or swelling    SKIN: Normal color for age and race; warm; no rash NEURO: Moves all extremities equally PSYCH: The patient's mood and manner are appropriate. Grooming and personal hygiene are appropriate.  MEDICAL DECISION MAKING: Patient here with reports of coffee-ground emesis, abdominal pain.  No bloody stool or melena on exam but is strongly guaiac positive.  Will check labs, CT of the abdomen pelvis given lower abdominal pain.  Will give Zofran.  In no distress at this time.  Will monitor closely for further episodes of bleeding.  Differential includes peptic ulcer disease, diverticulitis, colitis, bowel obstruction.  ED PROGRESS: Patient's labs show chronic kidney disease which is baseline.  Hemoglobin of 12.5.  No longer vomiting in the emergency department.  CT scan shows no acute abnormality in the abdomen or pelvis.  She does have a partially visualized moderate right pleural effusion with compressive atelectasis as well as possible atelectasis versus infiltrate in the left lower lobe.  She has no cough, fever or leukocytosis.  Have recommended admission for observation given reports of coffee-ground emesis and guaiac positive stool here.  Will give IV PPI.   2:10 AM Discussed patient's case with hospitalist, Dr. Hal Hope.  I have recommended admission and patient (and family if present) agree with this plan. Admitting physician will place admission orders.   I reviewed all nursing notes, vitals, pertinent previous records, EKGs, lab and urine results, imaging (as  available).    CRITICAL CARE Performed by: Pryor Curia   Total critical care time: 45 minutes  Critical care time was exclusive of separately billable procedures and treating other patients.  Critical care was necessary to treat or prevent imminent or life-threatening deterioration.  Critical care was time spent personally by me on the following activities: development of treatment plan with patient and/or surrogate as well as nursing, discussions with consultants, evaluation of patient's response to treatment, examination of patient, obtaining history from patient or surrogate, ordering and performing treatments and interventions, ordering and review of laboratory studies, ordering and review of radiographic studies, pulse oximetry and re-evaluation of patient's condition.      Ward, Delice Bison, DO 03/19/18 0210

## 2018-03-18 NOTE — ED Triage Notes (Signed)
Pt arrived via EMS from [place], [place]. Pt reports that she has been vomiting coffee ground substance, Pt does states that she has been having indigestion throughout the day. Pt has some abdominal distention, no bloody stool reported.    EMS v/s 150/p, HR 80, 117 CBG, RR 20

## 2018-03-18 NOTE — ED Notes (Signed)
Bed: VE55 Expected date:  Expected time:  Means of arrival:  Comments: EMS 84 yo from SNF-vomiting coffee ground-lower abd pain and distention SBP 150 HR 70-80

## 2018-03-19 ENCOUNTER — Emergency Department (HOSPITAL_COMMUNITY): Payer: Medicare Other

## 2018-03-19 ENCOUNTER — Encounter (HOSPITAL_COMMUNITY): Payer: Self-pay | Admitting: Internal Medicine

## 2018-03-19 DIAGNOSIS — E1122 Type 2 diabetes mellitus with diabetic chronic kidney disease: Secondary | ICD-10-CM

## 2018-03-19 DIAGNOSIS — K922 Gastrointestinal hemorrhage, unspecified: Secondary | ICD-10-CM | POA: Diagnosis present

## 2018-03-19 DIAGNOSIS — N183 Chronic kidney disease, stage 3 (moderate): Secondary | ICD-10-CM | POA: Diagnosis not present

## 2018-03-19 DIAGNOSIS — K5730 Diverticulosis of large intestine without perforation or abscess without bleeding: Secondary | ICD-10-CM | POA: Diagnosis not present

## 2018-03-19 DIAGNOSIS — K7460 Unspecified cirrhosis of liver: Secondary | ICD-10-CM | POA: Diagnosis not present

## 2018-03-19 DIAGNOSIS — K920 Hematemesis: Secondary | ICD-10-CM | POA: Diagnosis not present

## 2018-03-19 LAB — CBG MONITORING, ED
GLUCOSE-CAPILLARY: 88 mg/dL (ref 70–99)
Glucose-Capillary: 94 mg/dL (ref 70–99)
Glucose-Capillary: 81 mg/dL (ref 70–99)

## 2018-03-19 LAB — URINALYSIS, ROUTINE W REFLEX MICROSCOPIC
Bilirubin Urine: NEGATIVE
Glucose, UA: NEGATIVE mg/dL
Ketones, ur: 5 mg/dL — AB
Nitrite: NEGATIVE
PROTEIN: NEGATIVE mg/dL
Specific Gravity, Urine: 1.016 (ref 1.005–1.030)
pH: 5 (ref 5.0–8.0)

## 2018-03-19 LAB — CBC
HCT: 37.5 % (ref 36.0–46.0)
HCT: 40.3 % (ref 36.0–46.0)
HCT: 37 % (ref 36.0–46.0)
HEMOGLOBIN: 12 g/dL (ref 12.0–15.0)
Hemoglobin: 12 g/dL (ref 12.0–15.0)
Hemoglobin: 13 g/dL (ref 12.0–15.0)
MCH: 30.5 pg (ref 26.0–34.0)
MCH: 30.1 pg (ref 26.0–34.0)
MCH: 30.9 pg (ref 26.0–34.0)
MCHC: 32 g/dL (ref 30.0–36.0)
MCHC: 32.3 g/dL (ref 30.0–36.0)
MCHC: 32.4 g/dL (ref 30.0–36.0)
MCV: 95.4 fL (ref 80.0–100.0)
MCV: 93.3 fL (ref 80.0–100.0)
MCV: 95.4 fL (ref 80.0–100.0)
NRBC: 0 % (ref 0.0–0.2)
PLATELETS: 169 10*3/uL (ref 150–400)
Platelets: 175 10*3/uL (ref 150–400)
Platelets: 164 10*3/uL (ref 150–400)
RBC: 3.93 MIL/uL (ref 3.87–5.11)
RBC: 4.32 MIL/uL (ref 3.87–5.11)
RBC: 3.88 MIL/uL (ref 3.87–5.11)
RDW: 13.5 % (ref 11.5–15.5)
RDW: 13.4 % (ref 11.5–15.5)
RDW: 13.6 % (ref 11.5–15.5)
WBC: 10.1 10*3/uL (ref 4.0–10.5)
WBC: 8.6 10*3/uL (ref 4.0–10.5)
WBC: 7.9 10*3/uL (ref 4.0–10.5)
nRBC: 0 % (ref 0.0–0.2)
nRBC: 0 % (ref 0.0–0.2)

## 2018-03-19 LAB — BASIC METABOLIC PANEL
Anion gap: 13 (ref 5–15)
BUN: 55 mg/dL — ABNORMAL HIGH (ref 8–23)
CO2: 19 mmol/L — ABNORMAL LOW (ref 22–32)
Calcium: 9.1 mg/dL (ref 8.9–10.3)
Chloride: 110 mmol/L (ref 98–111)
Creatinine, Ser: 1.53 mg/dL — ABNORMAL HIGH (ref 0.44–1.00)
GFR calc Af Amer: 32 mL/min — ABNORMAL LOW (ref >60)
GFR calc non Af Amer: 28 mL/min — ABNORMAL LOW (ref >60)
Glucose, Bld: 105 mg/dL — ABNORMAL HIGH (ref 70–99)
Potassium: 4.3 mmol/L (ref 3.5–5.1)
Sodium: 142 mmol/L (ref 135–145)

## 2018-03-19 LAB — ABO/RH: ABO/RH(D): O POS

## 2018-03-19 LAB — TYPE AND SCREEN
ABO/RH(D): O POS
Antibody Screen: NEGATIVE

## 2018-03-19 LAB — GLUCOSE, CAPILLARY: GLUCOSE-CAPILLARY: 71 mg/dL (ref 70–99)

## 2018-03-19 MED ORDER — ACETAMINOPHEN 325 MG PO TABS
650.0000 mg | ORAL_TABLET | Freq: Four times a day (QID) | ORAL | Status: DC | PRN
  Administered 2018-03-20: 650 mg via ORAL
  Filled 2018-03-19: qty 2

## 2018-03-19 MED ORDER — PANTOPRAZOLE SODIUM 40 MG IV SOLR: 40.0000 mg | Freq: Two times a day (BID) | INTRAVENOUS | Status: DC

## 2018-03-19 MED ORDER — SODIUM CHLORIDE 0.9 % IV SOLN
INTRAVENOUS | Status: DC
  Administered 2018-03-19: 04:00:00 via INTRAVENOUS

## 2018-03-19 MED ORDER — ACETAMINOPHEN 650 MG RE SUPP: 650.0000 mg | Freq: Four times a day (QID) | RECTAL | Status: DC | PRN

## 2018-03-19 MED ORDER — INSULIN ASPART 100 UNIT/ML ~~LOC~~ SOLN
0.0000 [IU] | SUBCUTANEOUS | Status: DC
  Administered 2018-03-20: 1 [IU] via SUBCUTANEOUS
  Administered 2018-03-20: 3 [IU] via SUBCUTANEOUS
  Administered 2018-03-21 (×3): 1 [IU] via SUBCUTANEOUS

## 2018-03-19 MED ORDER — SODIUM CHLORIDE 0.9 % IV SOLN
8.0000 mg/h | INTRAVENOUS | Status: DC
  Administered 2018-03-19 – 2018-03-20 (×4): 8 mg/h via INTRAVENOUS
  Filled 2018-03-19 (×8): qty 80

## 2018-03-19 MED ORDER — HYDRALAZINE HCL 20 MG/ML IJ SOLN: 5.0000 mg | INTRAMUSCULAR | Status: DC | PRN

## 2018-03-19 MED ORDER — SODIUM CHLORIDE 0.9 % IV SOLN
80.0000 mg | Freq: Once | INTRAVENOUS | Status: AC
  Administered 2018-03-19: 04:00:00 80 mg via INTRAVENOUS
  Filled 2018-03-19: qty 80

## 2018-03-19 MED ORDER — ONDANSETRON HCL 4 MG/2ML IJ SOLN: 4.0000 mg | Freq: Four times a day (QID) | INTRAMUSCULAR | Status: DC | PRN

## 2018-03-19 MED ORDER — ONDANSETRON HCL 4 MG PO TABS: 4.0000 mg | ORAL_TABLET | Freq: Four times a day (QID) | ORAL | Status: DC | PRN

## 2018-03-19 NOTE — ED Notes (Signed)
Pt. CBG 88,RN,Kelly made aware.

## 2018-03-19 NOTE — Progress Notes (Signed)
Patient ID: Jill Allen, female   DOB: [birth year], 84 y.o.   MRN: [id number] Patient was admitted early this morning for possible upper GI bleeding and was started on Protonix drip.  Patient seen and examined at bedside.  She is confused and not a reliable historian.  I have reviewed the patient's medical records including this morning's H&P and current medications and lab results myself.  I have spoken to patient's nephew on phone and I have consulted gastroenterology as per the nephew's request.  Monitor H&H.  Continue Protonix.  Follow urinalysis and culture.

## 2018-03-19 NOTE — ED Notes (Signed)
ED TO INPATIENT HANDOFF REPORT  Name/Age/Gender Jill Allen 84 y.o. female  Code Status    Code Status Orders  (From admission, onward)         Start     Ordered   03/19/18 0215  Do not attempt resuscitation (DNR)  Continuous    Question Answer Comment  In the event of cardiac or respiratory ARREST Do not call a "code blue"   In the event of cardiac or respiratory ARREST Do not perform Intubation, CPR, defibrillation or ACLS   In the event of cardiac or respiratory ARREST Use medication by any route, position, wound care, and other measures to relive pain and suffering. May use oxygen, suction and manual treatment of airway obstruction as needed for comfort.      03/19/18 0216        Code Status History    Date Active Date Inactive Code Status Order ID Comments User Context   04/09/2016 1900 04/16/2016 2155 DNR [id number]  Samuella Cota, MD Inpatient   02/21/2016 1836 02/26/2016 1737 DNR [id number]  Nita Sells, MD Inpatient   02/07/2016 0008 02/09/2016 2137 DNR [id number]  Toy Baker, MD Inpatient      Home/SNF/Other Home  Chief Complaint emesis  Level of Care/Admitting Diagnosis ED Disposition    ED Disposition Condition Comment   Admit  Hospital Area: [facility] [[id number]]  Level of Care: Telemetry [5]  Admit to tele based on following criteria: Monitor for Ischemic changes  Diagnosis: Acute GI bleeding [597416]  Admitting Physician: Rise Patience [phone]  Attending Physician: Rise Patience [phone]  PT Class (Do Not Modify): Observation [104]  PT Acc Code (Do Not Modify): Observation [10022]       Medical History Past Medical History:  Diagnosis Date  . Diabetes ([place])    SLOW TO HEAL  . Gout   . HBP (high blood pressure)   . Kidney disease   . Poor circulation     Allergies No Known Allergies  IV Location/Drains/Wounds Patient Lines/Drains/Airways Status   Active Line/Drains/Airways    Name:    Placement date:   Placement time:   Site:   Days:   Peripheral IV 03/18/18 Right Antecubital   03/18/18    2317    Antecubital   1   Pressure Injury 02/21/16 Stage II -  Partial thickness loss of dermis presenting as a shallow open ulcer with a red, pink wound bed without slough.   02/21/16    1830     757   Wound / Incision (Open or Dehisced) 02/06/16 Other (Comment) Hip Left Two areas of skin breakdown, one is 2 cm long and 1.5 cm wide, the other is 6.5 cm long.    02/06/16    1436    Hip   772   Wound / Incision (Open or Dehisced) 02/21/16 Venous stasis ulcer Leg Right 4.5cmx2.5cm   02/21/16    1630    Leg   757   Wound / Incision (Open or Dehisced) 02/21/16 Venous stasis ulcer Leg Left 4cm x1cm   02/21/16    1830    Leg   757   Wound / Incision (Open or Dehisced) 04/10/16 Non-pressure wound;Venous stasis ulcer Leg Left   04/10/16    1200    Leg   708          Labs/Imaging Results for orders placed or performed during the hospital encounter of 03/18/18 (from the past 48 hour(s))  Comprehensive metabolic  panel     Status: Abnormal   Collection Time: 03/18/18 11:20 PM  Result Value Ref Range   Sodium 141 135 - 145 mmol/L   Potassium 4.3 3.5 - 5.1 mmol/L   Chloride 109 98 - 111 mmol/L   CO2 21 (L) 22 - 32 mmol/L   Glucose, Bld 116 (H) 70 - 99 mg/dL   BUN 55 (H) 8 - 23 mg/dL   Creatinine, Ser 1.55 (H) 0.44 - 1.00 mg/dL   Calcium 9.3 8.9 - 10.3 mg/dL   Total Protein 7.2 6.5 - 8.1 g/dL   Albumin 3.8 3.5 - 5.0 g/dL   AST 30 15 - 41 U/L   ALT 11 0 - 44 U/L   Alkaline Phosphatase 55 38 - 126 U/L   Total Bilirubin 0.7 0.3 - 1.2 mg/dL   GFR calc non Af Amer 27 (L) >60 mL/min   GFR calc Af Amer 32 (L) >60 mL/min   Anion gap 11 5 - 15    Comment: Performed at [facility], [place] [address] [place], [place] [address]  CBC     Status: None   Collection Time: 03/18/18 11:20 PM  Result Value Ref Range   WBC 9.6 4.0 - 10.5 K/uL   RBC 4.11 3.87 - 5.11 MIL/uL   Hemoglobin  12.5 12.0 - 15.0 g/dL   HCT 38.9 36.0 - 46.0 %   MCV 94.6 80.0 - 100.0 fL   MCH 30.4 26.0 - 34.0 pg   MCHC 32.1 30.0 - 36.0 g/dL   RDW 13.3 11.5 - 15.5 %   Platelets 171 150 - 400 K/uL   nRBC 0.0 0.0 - 0.2 %    Comment: Performed at [facility], [place] [address], [place], [place] [address]  Type and screen [place]     Status: None   Collection Time: 03/18/18 11:20 PM  Result Value Ref Range   ABO/RH(D) O POS    Antibody Screen NEG    Sample Expiration      03/21/2018 Performed at [facility], [place] [address], [place], [place] [address]   Protime-INR     Status: None   Collection Time: 03/18/18 11:20 PM  Result Value Ref Range   Prothrombin Time 13.3 11.4 - 15.2 seconds   INR 1.02     Comment: Performed at [facility], [place] [address] [place], [place] [address]  APTT     Status: None   Collection Time: 03/18/18 11:20 PM  Result Value Ref Range   aPTT 26 24 - 36 seconds    Comment: Performed at [facility], [place] [address] [place], [place] [address]  ABO/Rh     Status: None   Collection Time: 03/18/18 11:20 PM  Result Value Ref Range   ABO/RH(D)      O POS Performed at [facility], [place] [address] [place],  [address]   POC occult blood, ED     Status: Abnormal   Collection Time: 03/18/18 11:33 PM  Result Value Ref Range   Fecal Occult Bld POSITIVE (A) NEGATIVE  CBC     Status: None   Collection Time: 03/19/18  2:42 AM  Result Value Ref Range   WBC 10.1 4.0 - 10.5 K/uL   RBC 3.93 3.87 - 5.11 MIL/uL   Hemoglobin 12.0 12.0 - 15.0 g/dL   HCT 37.5 36.0 - 46.0 %   MCV 95.4 80.0 - 100.0 fL  MCH 30.5 26.0 - 34.0 pg   MCHC 32.0 30.0 - 36.0 g/dL   RDW 13.5 11.5 - 15.5 %   Platelets 175 150 - 400 K/uL   nRBC 0.0 0.0 - 0.2 %    Comment: Performed at [facility], [place] [address] [place], [place] [address]  CBG monitoring, ED      Status: None   Collection Time: 03/19/18  4:40 AM  Result Value Ref Range   Glucose-Capillary 88 70 - 99 mg/dL   Comment 1 Notify RN   Basic metabolic panel     Status: Abnormal   Collection Time: 03/19/18  6:32 AM  Result Value Ref Range   Sodium 142 135 - 145 mmol/L   Potassium 4.3 3.5 - 5.1 mmol/L   Chloride 110 98 - 111 mmol/L   CO2 19 (L) 22 - 32 mmol/L   Glucose, Bld 105 (H) 70 - 99 mg/dL   BUN 55 (H) 8 - 23 mg/dL   Creatinine, Ser 1.53 (H) 0.44 - 1.00 mg/dL   Calcium 9.1 8.9 - 10.3 mg/dL   GFR calc non Af Amer 28 (L) >60 mL/min   GFR calc Af Amer 32 (L) >60 mL/min   Anion gap 13 5 - 15    Comment: Performed at [facility], [place] [address] [place], [place] [address]  CBC     Status: None   Collection Time: 03/19/18  6:32 AM  Result Value Ref Range   WBC 8.6 4.0 - 10.5 K/uL   RBC 4.32 3.87 - 5.11 MIL/uL   Hemoglobin 13.0 12.0 - 15.0 g/dL   HCT 40.3 36.0 - 46.0 %   MCV 93.3 80.0 - 100.0 fL   MCH 30.1 26.0 - 34.0 pg   MCHC 32.3 30.0 - 36.0 g/dL   RDW 13.4 11.5 - 15.5 %   Platelets 169 150 - 400 K/uL   nRBC 0.0 0.0 - 0.2 %    Comment: Performed at [facility], [place] [address], [place], [place] [address]  CBG monitoring, ED     Status: None   Collection Time: 03/19/18  8:06 AM  Result Value Ref Range   Glucose-Capillary 94 70 - 99 mg/dL  CBG monitoring, ED     Status: None   Collection Time: 03/19/18 12:01 PM  Result Value Ref Range   Glucose-Capillary 81 70 - 99 mg/dL   Ct Abdomen Pelvis Wo Contrast  Result Date: 03/19/2018 CLINICAL DATA:  One 84 year old female with hematemesis. Abdominal distension. EXAM: CT ABDOMEN AND PELVIS WITHOUT CONTRAST TECHNIQUE: Multidetector CT imaging of the abdomen and pelvis was performed following the standard protocol without IV contrast. COMPARISON:  Renal ultrasound dated 04/10/2016 FINDINGS: Evaluation of this exam is limited in the absence of intravenous contrast. Lower chest: Partially  visualized moderate right pleural effusion with associated partial compressive atelectasis of the right lower lobe. Pneumonia is not excluded. Partially visualized area of consolidative change at the left lung base which may represent atelectasis although infiltrate is not excluded. Probable trace left pleural effusion. There is coronary vascular calcification. No intra-abdominal free air or free fluid. Hepatobiliary: There is irregular and nodular liver contour consistent with cirrhosis. No intrahepatic biliary ductal dilatation. The gallbladder is unremarkable. Pancreas: There is a 13 x 11 mm low attenuating lesion involving the tail of the pancreas which is not well characterized but may represent a side branch IPMN. This can be better characterized with MRI. No acute inflammatory changes. No  dilatation of the main pancreatic duct or gland atrophy. Spleen: Normal in size without focal abnormality. Adrenals/Urinary Tract: The adrenal glands are unremarkable. There is mild bilateral renal atrophy. There is no hydronephrosis or nephrolithiasis on either side. The visualized ureters and urinary bladder appear unremarkable. Stomach/Bowel: There is no bowel obstruction or active inflammation. There is extensive sigmoid diverticulosis without active inflammatory changes. The appendix is normal as visualized. Vascular/Lymphatic: Advanced aortoiliac atherosclerotic disease. There is a 2.5 cm infrarenal aortic ectasia. No portal venous gas. There is no adenopathy. Reproductive: Multiple calcified uterine fibroids with the largest measuring 3.7 cm in the right posterior body. Other: Midline vertical anterior pelvic wall incisional scar. Musculoskeletal: Osteopenia with degenerative changes of the spine and grade 1 L4-5 anterolisthesis. Degenerative changes of the hips. No acute osseous pathology. IMPRESSION: 1. No acute intra-abdominal or pelvic pathology. No bowel obstruction or active inflammation. Normal appendix. 2.  Extensive sigmoid diverticulosis without active inflammatory changes. 3. Cirrhosis. 4. Partially visualized moderate right pleural effusion with associated partial compressive atelectasis of the right lower lobe. Partially visualized area of consolidative change at the left lung base may represent atelectasis although infiltrate is not excluded. 5. A 13 x 11 mm low attenuating lesion in the tail of the pancreas. This can be better characterized with MRI. 6. Calcified uterine fibroids. Electronically Signed   By: Anner Crete M.D.   On: 03/19/2018 01:49    Pending Labs Unresulted Labs (From admission, onward)    Start     Ordered   03/20/18 0500  CBC with Differential/Platelet  Tomorrow morning,   R     03/19/18 1033   03/20/18 9163  Basic metabolic panel  Tomorrow morning,   R     03/19/18 1033   03/20/18 0500  Magnesium  Tomorrow morning,   R     03/19/18 1033   03/19/18 1034  Culture, Urine  Once,   R     03/19/18 1033   03/19/18 0956  Urinalysis, Routine w reflex microscopic  Once,   R     03/19/18 0955   03/19/18 0956  Culture, Urine  Once,   R     03/19/18 0955   03/19/18 0216  CBC  Now then every 4 hours,   R     03/19/18 0216          Vitals/Pain Today's Vitals   03/19/18 1200 03/19/18 1230 03/19/18 1308 03/19/18 1400  BP: (!) 161/66 (!) 139/58 (!) 145/68 (!) 149/74  Pulse: 78 69 74   Resp: 16 12 18 20   Temp:      TempSrc:      SpO2: 99% 97% 98%   PainSc:        Isolation Precautions No active isolations  Medications Medications  pantoprazole (PROTONIX) 80 mg in sodium chloride 0.9 % 250 mL (0.32 mg/mL) infusion (8 mg/hr Intravenous New Bag/Given 03/19/18 1302)  pantoprazole (PROTONIX) injection 40 mg (has no administration in time range)  acetaminophen (TYLENOL) tablet 650 mg (has no administration in time range)    Or  acetaminophen (TYLENOL) suppository 650 mg (has no administration in time range)  ondansetron (ZOFRAN) tablet 4 mg (has no administration in  time range)    Or  ondansetron (ZOFRAN) injection 4 mg (has no administration in time range)  insulin aspart (novoLOG) injection 0-9 Units (0 Units Subcutaneous Not Given 03/19/18 0502)  0.9 %  sodium chloride infusion ( Intravenous New Bag/Given 03/19/18 0410)  hydrALAZINE (APRESOLINE) injection 5 mg (has no  administration in time range)  ondansetron (ZOFRAN) injection 4 mg (4 mg Intravenous Given 03/18/18 2355)  pantoprazole (PROTONIX) 80 mg in sodium chloride 0.9 % 100 mL IVPB (0 mg Intravenous Stopped 03/19/18 0434)    Mobility walks with device

## 2018-03-19 NOTE — Consult Note (Signed)
[facility] CONSULT Reason for consult: Coffee-ground emesis Referring Physician: Emergency room.  PCP: Dr. Ardeth Perfect primary GI: None  Jill Allen is an 84 y.o. female.  HPI:  She is a resident of skilled nursing home and has dementia.  A lot of the history was obtained from previous notes and from the patient's nephew who is her POA.  She apparently has had prior admissions for UTIs that have caused her to have nausea and vomiting she has been vomiting recently and had a small amount of coffee-ground material.  CT of the abdomen did not clearly show any major problem.  There was a question of cirrhosis because her liver was a regular.  Her nephew had no real knowledge of this and her LFTs were completely normal.  There was a small cystic lesion in the tail of the pancreas.  The patient has been completely stable since being brought to the emergency room.  Her hemoglobin was 12.5 on admission and is actually gone up to 13 with IV fluids.  Past Medical History:  Diagnosis Date  . Diabetes ([place])    SLOW TO HEAL  . Gout   . HBP (high blood pressure)   . Kidney disease   . Poor circulation     Past Surgical History:  Procedure Laterality Date  . APPENDECTOMY    . CATARACT EXTRACTION Bilateral     Family History  Problem Relation Age of Onset  . Diabetes Mother   . Hypertension Brother   . Cancer Neg Hx     Social History:  reports that she quit smoking about 45 years ago. Her smoking use included cigarettes. She has never used smokeless tobacco. She reports that she does not drink alcohol or use drugs.  Allergies: No Known Allergies  Medications; Prior to Admission medications   Medication Sig Start Date End Date Taking? Authorizing Provider  acetaminophen (TYLENOL) 325 MG tablet Take 650 mg by mouth 3 (three) times daily.   Yes [provider]  allopurinol (ZYLOPRIM) 100 MG tablet Take 50 mg by mouth daily.   Yes [provider]  amLODipine  (NORVASC) 10 MG tablet Take 10 mg by mouth daily.  08/09/14  Yes [provider]  aspirin EC 81 MG tablet Take 81 mg by mouth daily.   Yes [provider]  carvedilol (COREG) 12.5 MG tablet Take 12.5 mg by mouth 2 (two) times daily with a meal.   Yes [provider]  insulin detemir (LEVEMIR) 100 UNIT/ML injection Inject 10 Units into the skin at bedtime.   Yes [provider]  Multiple Vitamin (DAILY VITE PO) Take 1 tablet by mouth daily.   Yes [provider]  Multiple Vitamins-Minerals (OCUVITE EXTRA) TABS Take 1 tablet by mouth daily.   Yes [provider]  Nutritional Supplements (NUTRITIONAL DRINK PO) Take 120 mLs by mouth 3 (three) times daily.   Yes [provider]  omeprazole (PRILOSEC) 20 MG capsule Take 20 mg by mouth daily.   Yes [provider]  vitamin C (ASCORBIC ACID) 500 MG tablet Take 500 mg by mouth 2 (two) times daily.   Yes [provider]  guaiFENesin-dextromethorphan (ROBITUSSIN DM) 100-10 MG/5ML syrup Take 5 mLs by mouth every 4 (four) hours as needed for cough. Patient not taking: Reported on 03/18/2018 04/16/16   Annita Brod, MD  magnesium oxide (MAG-OX) 400 (241.3 Mg) MG tablet Take 1 tablet (400 mg total) by mouth 2 (two) times daily. Patient not taking: Reported on  03/18/2018 02/26/16   Raiford Noble Latif, DO   . insulin aspart  0-9 Units Subcutaneous Q4H  . [START ON 03/22/2018] pantoprazole  40 mg Intravenous Q12H   PRN Meds acetaminophen **OR** acetaminophen, hydrALAZINE, ondansetron **OR** ondansetron (ZOFRAN) IV Results for orders placed or performed during the hospital encounter of 03/18/18 (from the past 48 hour(s))  Comprehensive metabolic panel     Status: Abnormal   Collection Time: 03/18/18 11:20 PM  Result Value Ref Range   Sodium 141 135 - 145 mmol/L   Potassium 4.3 3.5 - 5.1 mmol/L   Chloride 109 98 - 111 mmol/L   CO2 21 (L) 22 - 32 mmol/L   Glucose, Bld 116 (H) 70  - 99 mg/dL   BUN 55 (H) 8 - 23 mg/dL   Creatinine, Ser 1.55 (H) 0.44 - 1.00 mg/dL   Calcium 9.3 8.9 - 10.3 mg/dL   Total Protein 7.2 6.5 - 8.1 g/dL   Albumin 3.8 3.5 - 5.0 g/dL   AST 30 15 - 41 U/L   ALT 11 0 - 44 U/L   Alkaline Phosphatase 55 38 - 126 U/L   Total Bilirubin 0.7 0.3 - 1.2 mg/dL   GFR calc non Af Amer 27 (L) >60 mL/min   GFR calc Af Amer 32 (L) >60 mL/min   Anion gap 11 5 - 15    Comment: Performed at [facility], [place] [address], [place], [place] [address]  CBC     Status: None   Collection Time: 03/18/18 11:20 PM  Result Value Ref Range   WBC 9.6 4.0 - 10.5 K/uL   RBC 4.11 3.87 - 5.11 MIL/uL   Hemoglobin 12.5 12.0 - 15.0 g/dL   HCT 38.9 36.0 - 46.0 %   MCV 94.6 80.0 - 100.0 fL   MCH 30.4 26.0 - 34.0 pg   MCHC 32.1 30.0 - 36.0 g/dL   RDW 13.3 11.5 - 15.5 %   Platelets 171 150 - 400 K/uL   nRBC 0.0 0.0 - 0.2 %    Comment: Performed at [facility], [place] [address] [place], [place] [address]  Type and screen [place]     Status: None   Collection Time: 03/18/18 11:20 PM  Result Value Ref Range   ABO/RH(D) O POS    Antibody Screen NEG    Sample Expiration      03/21/2018 Performed at [facility], [place] [address] [place], [place] [address]   Protime-INR     Status: None   Collection Time: 03/18/18 11:20 PM  Result Value Ref Range   Prothrombin Time 13.3 11.4 - 15.2 seconds   INR 1.02     Comment: Performed at [facility], [place] [address], [place], [place] [address]  APTT     Status: None   Collection Time: 03/18/18 11:20 PM  Result Value Ref Range   aPTT 26 24 - 36 seconds    Comment: Performed at [facility], [place] [address] [place], [place] [address]  ABO/Rh     Status: None   Collection Time: 03/18/18 11:20 PM  Result Value Ref Range   ABO/RH(D)      O POS Performed at [facility], [place] [address]  [place], [place] [address]   POC occult blood, ED     Status: Abnormal   Collection Time: 03/18/18 11:33 PM  Result Value Ref Range   Fecal Occult Bld POSITIVE (A) NEGATIVE  CBC     Status: None   Collection Time: 03/19/18  2:42 AM  Result Value Ref Range   WBC 10.1 4.0 - 10.5 K/uL   RBC 3.93 3.87 - 5.11 MIL/uL   Hemoglobin 12.0 12.0 - 15.0 g/dL   HCT 37.5 36.0 - 46.0 %   MCV 95.4 80.0 - 100.0 fL   MCH 30.5 26.0 - 34.0 pg   MCHC 32.0 30.0 - 36.0 g/dL   RDW 13.5 11.5 - 15.5 %   Platelets 175 150 - 400 K/uL   nRBC 0.0 0.0 - 0.2 %    Comment: Performed at [facility], [place] [address] [place], [place] [address]  CBG monitoring, ED     Status: None   Collection Time: 03/19/18  4:40 AM  Result Value Ref Range   Glucose-Capillary 88 70 - 99 mg/dL   Comment 1 Notify RN   Basic metabolic panel     Status: Abnormal   Collection Time: 03/19/18  6:32 AM  Result Value Ref Range   Sodium 142 135 - 145 mmol/L   Potassium 4.3 3.5 - 5.1 mmol/L   Chloride 110 98 - 111 mmol/L   CO2 19 (L) 22 - 32 mmol/L   Glucose, Bld 105 (H) 70 - 99 mg/dL   BUN 55 (H) 8 - 23 mg/dL   Creatinine, Ser 1.53 (H) 0.44 - 1.00 mg/dL   Calcium 9.1 8.9 - 10.3 mg/dL   GFR calc non Af Amer 28 (L) >60 mL/min   GFR calc Af Amer 32 (L) >60 mL/min   Anion gap 13 5 - 15    Comment: Performed at [facility], [place] [address] [place], [place] [address]  CBC     Status: None   Collection Time: 03/19/18  6:32 AM  Result Value Ref Range   WBC 8.6 4.0 - 10.5 K/uL   RBC 4.32 3.87 - 5.11 MIL/uL   Hemoglobin 13.0 12.0 - 15.0 g/dL   HCT 40.3 36.0 - 46.0 %   MCV 93.3 80.0 - 100.0 fL   MCH 30.1 26.0 - 34.0 pg   MCHC 32.3 30.0 - 36.0 g/dL   RDW 13.4 11.5 - 15.5 %   Platelets 169 150 - 400 K/uL   nRBC 0.0 0.0 - 0.2 %    Comment: Performed at [facility], [place] [address], [place], [place] [address]  CBG monitoring, ED     Status: None   Collection Time: 03/19/18  8:06 AM   Result Value Ref Range   Glucose-Capillary 94 70 - 99 mg/dL    Ct Abdomen Pelvis Wo Contrast  Result Date: 03/19/2018 CLINICAL DATA:  One 84 year old female with hematemesis. Abdominal distension. EXAM: CT ABDOMEN AND PELVIS WITHOUT CONTRAST TECHNIQUE: Multidetector CT imaging of the abdomen and pelvis was performed following the standard protocol without IV contrast. COMPARISON:  Renal ultrasound dated 04/10/2016 FINDINGS: Evaluation of this exam is limited in the absence of intravenous contrast. Lower chest: Partially visualized moderate right pleural effusion with associated partial compressive atelectasis of the right lower lobe. Pneumonia is not excluded. Partially visualized area of consolidative change at the left lung base which may represent atelectasis although infiltrate is not excluded. Probable trace left pleural effusion. There is coronary vascular calcification. No intra-abdominal free air or free fluid. Hepatobiliary: There is irregular and nodular liver contour consistent with cirrhosis. No intrahepatic biliary ductal dilatation. The gallbladder is unremarkable. Pancreas: There is a 13 x 11 mm low  attenuating lesion involving the tail of the pancreas which is not well characterized but may represent a side branch IPMN. This can be better characterized with MRI. No acute inflammatory changes. No dilatation of the main pancreatic duct or gland atrophy. Spleen: Normal in size without focal abnormality. Adrenals/Urinary Tract: The adrenal glands are unremarkable. There is mild bilateral renal atrophy. There is no hydronephrosis or nephrolithiasis on either side. The visualized ureters and urinary bladder appear unremarkable. Stomach/Bowel: There is no bowel obstruction or active inflammation. There is extensive sigmoid diverticulosis without active inflammatory changes. The appendix is normal as visualized. Vascular/Lymphatic: Advanced aortoiliac atherosclerotic disease. There is a 2.5 cm  infrarenal aortic ectasia. No portal venous gas. There is no adenopathy. Reproductive: Multiple calcified uterine fibroids with the largest measuring 3.7 cm in the right posterior body. Other: Midline vertical anterior pelvic wall incisional scar. Musculoskeletal: Osteopenia with degenerative changes of the spine and grade 1 L4-5 anterolisthesis. Degenerative changes of the hips. No acute osseous pathology. IMPRESSION: 1. No acute intra-abdominal or pelvic pathology. No bowel obstruction or active inflammation. Normal appendix. 2. Extensive sigmoid diverticulosis without active inflammatory changes. 3. Cirrhosis. 4. Partially visualized moderate right pleural effusion with associated partial compressive atelectasis of the right lower lobe. Partially visualized area of consolidative change at the left lung base may represent atelectasis although infiltrate is not excluded. 5. A 13 x 11 mm low attenuating lesion in the tail of the pancreas. This can be better characterized with MRI. 6. Calcified uterine fibroids. Electronically Signed   By: Anner Crete M.D.   On: 03/19/2018 01:49               Blood pressure (!) 175/64, pulse 72, temperature 97.8 F (36.6 C), temperature source Oral, resp. rate 16, SpO2 98 %.  Physical exam:   General--somewhat obese elderly African-American female ENT--nonicteric, throat reveals dentures mucous membranes somewhat dry Neck--supple Heart--regular rate without murmurs gallops Lungs--clear Abdomen--soft and completely nontender good bowel sounds    Assessment: 1.  Coffee-ground emesis.  The patient has had prior vomiting with UTIs and we do not have a urinalysis as of yet.  There was a question of cirrhosis on the CT bit.  Liver function tests are completely normal.  Have discussed all this with the patient's nephew and given her advanced age I would not recommend EGD unless she was actively bleeding.  Plan: 1.  We will go ahead and let her have  liquids.  We will follow her clinically and see if she has further bleeding or drops her hemoglobin.  If that is the case we could re-address the possibility of EGD. 2.  I will go ahead and order urinalysis and culture.   Nancy Fetter 03/19/2018, 9:49 AM   This note was created using voice recognition software and minor errors may Have occurred unintentionally. Pager: [phone] If no answer or after hours call [phone]

## 2018-03-19 NOTE — H&P (Signed)
History and Physical    Jill Allen FMB:[id number] DOB: 1915-08-05 DOA: 03/18/2018  PCP: [name], MD  Patient coming from: Skilled nursing facility.  Chief Complaint: Throwing up blood.  HPI: Jill Allen is a 84 y.o. female with history of diabetes mellitus type 2, gout, hypertension, chronic kidney disease stage III was referred to the ER after patient had 2-3 episodes of hematemesis.  Most of the history is obtained from ER physician.  Patient has dementia and is not contributing much to the history.  Patient however denies any chest pain shortness of breath abdominal pain.  ED Course: In the ER patient's hemoglobin was around 13 was hemodynamically stable started on Protonix infusion and admitted for further observation.  Patient does take aspirin.  Stool for occult blood was positive.  Review of Systems: As per HPI, rest all negative.   Past Medical History:  Diagnosis Date  . Diabetes ([place])    SLOW TO HEAL  . Gout   . HBP (high blood pressure)   . Kidney disease   . Poor circulation     Past Surgical History:  Procedure Laterality Date  . APPENDECTOMY    . CATARACT EXTRACTION Bilateral      reports that she quit smoking about 45 years ago. Her smoking use included cigarettes. She has never used smokeless tobacco. She reports that she does not drink alcohol or use drugs.  No Known Allergies  Family History  Problem Relation Age of Onset  . Diabetes Mother   . Hypertension Brother   . Cancer Neg Hx     Prior to Admission medications   Medication Sig Start Date End Date Taking? Authorizing Provider  acetaminophen (TYLENOL) 325 MG tablet Take 650 mg by mouth 3 (three) times daily.   Yes [provider]  allopurinol (ZYLOPRIM) 100 MG tablet Take 50 mg by mouth daily.   Yes [provider]  amLODipine (NORVASC) 10 MG tablet Take 10 mg by mouth daily.  08/09/14  Yes [provider]  aspirin EC 81 MG tablet Take 81 mg by mouth  daily.   Yes [provider]  carvedilol (COREG) 12.5 MG tablet Take 12.5 mg by mouth 2 (two) times daily with a meal.   Yes [provider]  insulin detemir (LEVEMIR) 100 UNIT/ML injection Inject 10 Units into the skin at bedtime.   Yes [provider]  Multiple Vitamin (DAILY VITE PO) Take 1 tablet by mouth daily.   Yes [provider]  Multiple Vitamins-Minerals (OCUVITE EXTRA) TABS Take 1 tablet by mouth daily.   Yes [provider]  Nutritional Supplements (NUTRITIONAL DRINK PO) Take 120 mLs by mouth 3 (three) times daily.   Yes [provider]  omeprazole (PRILOSEC) 20 MG capsule Take 20 mg by mouth daily.   Yes [provider]  vitamin C (ASCORBIC ACID) 500 MG tablet Take 500 mg by mouth 2 (two) times daily.   Yes [provider]  guaiFENesin-dextromethorphan (ROBITUSSIN DM) 100-10 MG/5ML syrup Take 5 mLs by mouth every 4 (four) hours as needed for cough. Patient not taking: Reported on 03/18/2018 04/16/16   Annita Brod, MD  magnesium oxide (MAG-OX) 400 (241.3 Mg) MG tablet Take 1 tablet (400 mg total) by mouth 2 (two) times daily. Patient not taking: Reported on 03/18/2018 02/26/16   Raiford Noble Dickson, DO    Physical Exam: Vitals:   03/18/18 2330 03/19/18 0000 03/19/18 0030 03/19/18 0100  BP: (!) 107/31 (!) 148/64 (!) 134/98 Marland Kitchen)  140/57  Pulse: 64 66  71  Resp: 16 16 (!) 21 12  Temp:      TempSrc:      SpO2: 99% 99%  100%      Constitutional: Moderately built and nourished. Vitals:   03/18/18 2330 03/19/18 0000 03/19/18 0030 03/19/18 0100  BP: (!) 107/31 (!) 148/64 (!) 134/98 (!) 140/57  Pulse: 64 66  71  Resp: 16 16 (!) 21 12  Temp:      TempSrc:      SpO2: 99% 99%  100%   Eyes: Anicteric no pallor. ENMT: No discharge from the ears eyes nose or mouth. Neck: No mass felt.  No neck rigidity. Respiratory: No rhonchi or crepitations. Cardiovascular: S1-S2 heard no murmurs appreciated. Abdomen:  Soft nontender bowel sounds present. Musculoskeletal: No edema.  No joint effusion. Skin: No rash. Neurologic: Alert awake oriented to name.  Moves all extremities.  But bedbound. Psychiatric: Oriented to her name.   Labs on Admission: I have personally reviewed following labs and imaging studies  CBC: Recent Labs  Lab 03/18/18 2320  WBC 9.6  HGB 12.5  HCT 38.9  MCV 94.6  PLT 662   Basic Metabolic Panel: Recent Labs  Lab 03/18/18 2320  NA 141  K 4.3  CL 109  CO2 21*  GLUCOSE 116*  BUN 55*  CREATININE 1.55*  CALCIUM 9.3   GFR: CrCl cannot be calculated (Unknown ideal weight.). Liver Function Tests: Recent Labs  Lab 03/18/18 2320  AST 30  ALT 11  ALKPHOS 55  BILITOT 0.7  PROT 7.2  ALBUMIN 3.8   No results for input(s): LIPASE, AMYLASE in the last 168 hours. No results for input(s): AMMONIA in the last 168 hours. Coagulation Profile: Recent Labs  Lab 03/18/18 2320  INR 1.02   Cardiac Enzymes: No results for input(s): CKTOTAL, CKMB, CKMBINDEX, TROPONINI in the last 168 hours. BNP (last 3 results) No results for input(s): PROBNP in the last 8760 hours. HbA1C: No results for input(s): HGBA1C in the last 72 hours. CBG: No results for input(s): GLUCAP in the last 168 hours. Lipid Profile: No results for input(s): CHOL, HDL, LDLCALC, TRIG, CHOLHDL, LDLDIRECT in the last 72 hours. Thyroid Function Tests: No results for input(s): TSH, T4TOTAL, FREET4, T3FREE, THYROIDAB in the last 72 hours. Anemia Panel: No results for input(s): VITAMINB12, FOLATE, FERRITIN, TIBC, IRON, RETICCTPCT in the last 72 hours. Urine analysis:    Component Value Date/Time   COLORURINE YELLOW 04/09/2016 1230   APPEARANCEUR TURBID (A) 04/09/2016 1230   LABSPEC 1.014 04/09/2016 1230   PHURINE 6.0 04/09/2016 1230   GLUCOSEU NEGATIVE 04/09/2016 1230   HGBUR MODERATE (A) 04/09/2016 1230   BILIRUBINUR NEGATIVE 04/09/2016 1230   KETONESUR NEGATIVE 04/09/2016 1230   PROTEINUR 100 (A)  04/09/2016 1230   NITRITE NEGATIVE 04/09/2016 1230   LEUKOCYTESUR LARGE (A) 04/09/2016 1230   Sepsis Labs: @LABRCNTIP (procalcitonin:4,lacticidven:4) )No results found for this or any previous visit (from the past 240 hour(s)).   Radiological Exams on Admission: Ct Abdomen Pelvis Wo Contrast  Result Date: 03/19/2018 CLINICAL DATA:  One 84 year old female with hematemesis. Abdominal distension. EXAM: CT ABDOMEN AND PELVIS WITHOUT CONTRAST TECHNIQUE: Multidetector CT imaging of the abdomen and pelvis was performed following the standard protocol without IV contrast. COMPARISON:  Renal ultrasound dated 04/10/2016 FINDINGS: Evaluation of this exam is limited in the absence of intravenous contrast. Lower chest: Partially visualized moderate right pleural effusion with associated partial compressive atelectasis of the right lower lobe. Pneumonia is  not excluded. Partially visualized area of consolidative change at the left lung base which may represent atelectasis although infiltrate is not excluded. Probable trace left pleural effusion. There is coronary vascular calcification. No intra-abdominal free air or free fluid. Hepatobiliary: There is irregular and nodular liver contour consistent with cirrhosis. No intrahepatic biliary ductal dilatation. The gallbladder is unremarkable. Pancreas: There is a 13 x 11 mm low attenuating lesion involving the tail of the pancreas which is not well characterized but may represent a side branch IPMN. This can be better characterized with MRI. No acute inflammatory changes. No dilatation of the main pancreatic duct or gland atrophy. Spleen: Normal in size without focal abnormality. Adrenals/Urinary Tract: The adrenal glands are unremarkable. There is mild bilateral renal atrophy. There is no hydronephrosis or nephrolithiasis on either side. The visualized ureters and urinary bladder appear unremarkable. Stomach/Bowel: There is no bowel obstruction or active inflammation.  There is extensive sigmoid diverticulosis without active inflammatory changes. The appendix is normal as visualized. Vascular/Lymphatic: Advanced aortoiliac atherosclerotic disease. There is a 2.5 cm infrarenal aortic ectasia. No portal venous gas. There is no adenopathy. Reproductive: Multiple calcified uterine fibroids with the largest measuring 3.7 cm in the right posterior body. Other: Midline vertical anterior pelvic wall incisional scar. Musculoskeletal: Osteopenia with degenerative changes of the spine and grade 1 L4-5 anterolisthesis. Degenerative changes of the hips. No acute osseous pathology. IMPRESSION: 1. No acute intra-abdominal or pelvic pathology. No bowel obstruction or active inflammation. Normal appendix. 2. Extensive sigmoid diverticulosis without active inflammatory changes. 3. Cirrhosis. 4. Partially visualized moderate right pleural effusion with associated partial compressive atelectasis of the right lower lobe. Partially visualized area of consolidative change at the left lung base may represent atelectasis although infiltrate is not excluded. 5. A 13 x 11 mm low attenuating lesion in the tail of the pancreas. This can be better characterized with MRI. 6. Calcified uterine fibroids. Electronically Signed   By: Anner Crete M.D.   On: 03/19/2018 01:49     Assessment/Plan Principal Problem:   Acute GI bleeding Active Problems:   CKD (chronic kidney disease), stage III ([facility])   DM (diabetes mellitus), type 2 with renal complications ([place])    1. Acute GI bleeding with hematemesis -patient placed on Protonix infusion.  Hold aspirin.  Check serial CBCs. 2. Diabetes mellitus type 2 we will keep patient on sliding scale coverage. 3. Hypertension we will keep patient on PRN IV hydralazine since patient is n.p.o. 4. Chronic kidney disease stage III creatinine appears to be at baseline. 5. History of gout.   DVT prophylaxis: SCDs. Code Status: DNR. Family Communication: No  family at bedside. Disposition Plan: Home. Consults called: None. Admission status: Observation.   Rise Patience MD [facility] Pager [phone].  If 7PM-7AM, please contact night-coverage [email or web address] Password [email or web address]  03/19/2018, 2:16 AM

## 2018-03-20 DIAGNOSIS — K920 Hematemesis: Secondary | ICD-10-CM | POA: Diagnosis not present

## 2018-03-20 DIAGNOSIS — N183 Chronic kidney disease, stage 3 (moderate): Secondary | ICD-10-CM | POA: Diagnosis not present

## 2018-03-20 DIAGNOSIS — K922 Gastrointestinal hemorrhage, unspecified: Secondary | ICD-10-CM | POA: Diagnosis not present

## 2018-03-20 LAB — BASIC METABOLIC PANEL
Anion gap: 11 (ref 5–15)
BUN: 43 mg/dL — ABNORMAL HIGH (ref 8–23)
CO2: 21 mmol/L — ABNORMAL LOW (ref 22–32)
Calcium: 8.8 mg/dL — ABNORMAL LOW (ref 8.9–10.3)
Chloride: 113 mmol/L — ABNORMAL HIGH (ref 98–111)
Creatinine, Ser: 1.54 mg/dL — ABNORMAL HIGH (ref 0.44–1.00)
GFR calc Af Amer: 32 mL/min — ABNORMAL LOW (ref >60)
GFR calc non Af Amer: 27 mL/min — ABNORMAL LOW (ref >60)
Glucose, Bld: 104 mg/dL — ABNORMAL HIGH (ref 70–99)
Potassium: 3.9 mmol/L (ref 3.5–5.1)
SODIUM: 145 mmol/L (ref 135–145)

## 2018-03-20 LAB — CBC WITH DIFFERENTIAL/PLATELET
Abs Immature Granulocytes: 0.04 10*3/uL (ref 0.00–0.07)
Basophils Absolute: 0.1 10*3/uL (ref 0.0–0.1)
Basophils Relative: 1 %
Eosinophils Absolute: 0.1 10*3/uL (ref 0.0–0.5)
Eosinophils Relative: 1 %
HCT: 36.5 % (ref 36.0–46.0)
Hemoglobin: 11.5 g/dL — ABNORMAL LOW (ref 12.0–15.0)
Immature Granulocytes: 1 %
Lymphocytes Relative: 26 %
Lymphs Abs: 1.4 10*3/uL (ref 0.7–4.0)
MCH: 29.9 pg (ref 26.0–34.0)
MCHC: 31.5 g/dL (ref 30.0–36.0)
MCV: 95.1 fL (ref 80.0–100.0)
Monocytes Absolute: 0.6 10*3/uL (ref 0.1–1.0)
Monocytes Relative: 11 %
NRBC: 0 % (ref 0.0–0.2)
Neutro Abs: 3.3 10*3/uL (ref 1.7–7.7)
Neutrophils Relative %: 60 %
Platelets: 160 10*3/uL (ref 150–400)
RBC: 3.84 MIL/uL — ABNORMAL LOW (ref 3.87–5.11)
RDW: 13.7 % (ref 11.5–15.5)
WBC: 5.5 10*3/uL (ref 4.0–10.5)

## 2018-03-20 LAB — GLUCOSE, CAPILLARY
Glucose-Capillary: 146 mg/dL — ABNORMAL HIGH (ref 70–99)
Glucose-Capillary: 202 mg/dL — ABNORMAL HIGH (ref 70–99)
Glucose-Capillary: 69 mg/dL — ABNORMAL LOW (ref 70–99)
Glucose-Capillary: 90 mg/dL (ref 70–99)
Glucose-Capillary: 95 mg/dL (ref 70–99)

## 2018-03-20 LAB — MAGNESIUM: MAGNESIUM: 2.4 mg/dL (ref 1.7–2.4)

## 2018-03-20 MED ORDER — AMLODIPINE BESYLATE 10 MG PO TABS
10.0000 mg | ORAL_TABLET | Freq: Every day | ORAL | Status: DC
  Administered 2018-03-20 – 2018-03-21 (×2): 10 mg via ORAL
  Filled 2018-03-20 (×2): qty 1

## 2018-03-20 MED ORDER — CARVEDILOL 12.5 MG PO TABS
12.5000 mg | ORAL_TABLET | Freq: Two times a day (BID) | ORAL | Status: DC
  Administered 2018-03-20 – 2018-03-21 (×2): 12.5 mg via ORAL
  Filled 2018-03-20 (×2): qty 1

## 2018-03-20 MED ORDER — DEXTROSE-NACL 5-0.9 % IV SOLN
INTRAVENOUS | Status: DC
  Administered 2018-03-20: 01:00:00 via INTRAVENOUS

## 2018-03-20 NOTE — Progress Notes (Signed)
[facility] PROGRESS NOTE Subjective No gross bleeding overnight apparently tolerating clear liquids  Objective: Vital signs in last 24 hours: Temp:  [97.9 F (36.6 C)-98.7 F (37.1 C)] 98.5 F (36.9 C) (12/07 1526) Pulse Rate:  [54-73] 54 (12/07 1526) Resp:  [16] 16 (12/07 1526) BP: (127-170)/(69-88) 127/69 (12/07 1526) SpO2:  [97 %-99 %] 97 % (12/07 1526) Weight:  [85.2 kg] 85.2 kg (12/07 0500)    Intake/Output from previous day: 12/06 0701 - 12/07 0700 In: 1077.8 [I.V.:1077.8] Out: 700 [Urine:700] Intake/Output this shift: No intake/output data recorded.  PE:  Abdomen--soft and nontender Lab Results: Recent Labs    03/18/18 2320 03/19/18 0242 03/19/18 0632 03/19/18 1536 03/20/18 0621  WBC 9.6 10.1 8.6 7.9 5.5  HGB 12.5 12.0 13.0 12.0 11.5*  HCT 38.9 37.5 40.3 37.0 36.5  PLT 171 175 169 164 160   BMET Recent Labs    03/18/18 2320 03/19/18 0632 03/20/18 0621  NA 141 142 145  K 4.3 4.3 3.9  CL 109 110 113*  CO2 21* 19* 21*  CREATININE 1.55* 1.53* 1.54*   LFT Recent Labs    03/18/18 2320  PROT 7.2  AST 30  ALT 11  ALKPHOS 55  BILITOT 0.7   PT/INR Recent Labs    03/18/18 2320  LABPROT 13.3  INR 1.02   PANCREAS No results for input(s): LIPASE in the last 72 hours.       Studies/Results: Ct Abdomen Pelvis Wo Contrast  Result Date: 03/19/2018 CLINICAL DATA:  One 84 year old female with hematemesis. Abdominal distension. EXAM: CT ABDOMEN AND PELVIS WITHOUT CONTRAST TECHNIQUE: Multidetector CT imaging of the abdomen and pelvis was performed following the standard protocol without IV contrast. COMPARISON:  Renal ultrasound dated 04/10/2016 FINDINGS: Evaluation of this exam is limited in the absence of intravenous contrast. Lower chest: Partially visualized moderate right pleural effusion with associated partial compressive atelectasis of the right lower lobe. Pneumonia is not excluded. Partially visualized area of consolidative  change at the left lung base which may represent atelectasis although infiltrate is not excluded. Probable trace left pleural effusion. There is coronary vascular calcification. No intra-abdominal free air or free fluid. Hepatobiliary: There is irregular and nodular liver contour consistent with cirrhosis. No intrahepatic biliary ductal dilatation. The gallbladder is unremarkable. Pancreas: There is a 13 x 11 mm low attenuating lesion involving the tail of the pancreas which is not well characterized but may represent a side branch IPMN. This can be better characterized with MRI. No acute inflammatory changes. No dilatation of the main pancreatic duct or gland atrophy. Spleen: Normal in size without focal abnormality. Adrenals/Urinary Tract: The adrenal glands are unremarkable. There is mild bilateral renal atrophy. There is no hydronephrosis or nephrolithiasis on either side. The visualized ureters and urinary bladder appear unremarkable. Stomach/Bowel: There is no bowel obstruction or active inflammation. There is extensive sigmoid diverticulosis without active inflammatory changes. The appendix is normal as visualized. Vascular/Lymphatic: Advanced aortoiliac atherosclerotic disease. There is a 2.5 cm infrarenal aortic ectasia. No portal venous gas. There is no adenopathy. Reproductive: Multiple calcified uterine fibroids with the largest measuring 3.7 cm in the right posterior body. Other: Midline vertical anterior pelvic wall incisional scar. Musculoskeletal: Osteopenia with degenerative changes of the spine and grade 1 L4-5 anterolisthesis. Degenerative changes of the hips. No acute osseous pathology. IMPRESSION: 1. No acute intra-abdominal or pelvic pathology. No bowel obstruction or active inflammation. Normal appendix. 2. Extensive sigmoid diverticulosis without active inflammatory changes. 3. Cirrhosis. 4. Partially visualized moderate  right pleural effusion with associated partial compressive atelectasis  of the right lower lobe. Partially visualized area of consolidative change at the left lung base may represent atelectasis although infiltrate is not excluded. 5. A 13 x 11 mm low attenuating lesion in the tail of the pancreas. This can be better characterized with MRI. 6. Calcified uterine fibroids. Electronically Signed   By: Anner Crete M.D.   On: 03/19/2018 01:49    Medications: I have reviewed the patient's current medications.  Assessment:   1.  Questionable hematemesis.  Currently no signs of active bleeding.  Patient is not vomiting.  Given her dementia and advanced age I would not recommend diagnostic testing unless she continues to bleed and drop her hemoglobin.   Plan: 1.  Would continue her on PPI therapy.  Since she is tolerating clear liquids would consider going ahead and slowly advancing her diet and see how she does.   Nancy Fetter 03/20/2018, 3:27 PM  This note was created using voice recognition software. Minor errors may Have occurred unintentionally.  Pager: [phone] If no answer or after hours call [phone]

## 2018-03-20 NOTE — Progress Notes (Signed)
Patient ID: Jill Allen, female   DOB: Nov 16, 1915, 84 y.o.   MRN: [id number]  PROGRESS NOTE    Jill Allen  BWI:[id number] DOB: 04-27-1915 DOA: 03/18/2018 PCP: Earlyne Iba, MD   Brief Narrative:  84 year old female with history of diabetes mellitus type II, gout, hypertension, chronic kidney disease stage III, dementia presented with concern for hematemesis versus coffee-ground emesis.  She was started on intravenous Protonix.  GI was consulted.   Assessment & Plan:   Principal Problem:   Acute GI bleeding Active Problems:   CKD (chronic kidney disease), stage III (HCC)   DM (diabetes mellitus), type 2 with renal complications (HCC)   Probable upper GI bleeding presenting with hematemesis/coffee-ground emesis -Currently on Protonix.  GI following and no plans for EGD unless patient is actively bleeding.  No overnight vomiting episodes reported by the nursing staff.  Clear liquid diet and might advance if tolerated if okay with GI -Hemoglobin stable  Diabetes mellitus type 2 -CBGs with insulin sliding scale coverage  Hypertension -Monitor blood pressure.  Will resume oral antihypertensives.  Chronic kidney disease stage III -Creatinine at baseline.  Dementia -Monitor mental status.  Fall precautions.  Outpatient follow-up  DVT prophylaxis: SCDs Code Status: DNR Family Communication: Spoke to niece at bedside.  Spoke to nephew on phone on 03/19/2018 Disposition Plan: Nursing home in 1 to 2 days once cleared by GI  Consultants: GI Procedures: None  Antimicrobials: None   Subjective: Patient seen and examined at bedside.  Patient is pleasantly confused.  No overnight vomiting or fever reported by nursing staff. Objective: Vitals:   03/19/18 1439 03/19/18 2248 03/20/18 0415 03/20/18 0500  BP: (!) 172/68 (!) 148/88 (!) 170/88   Pulse: 70 73 64   Resp: 16 16 16    Temp: 97.8 F (36.6 C) 98.7 F (37.1 C) 97.9 F (36.6 C)   TempSrc: Oral Oral Oral     SpO2: 98% 98% 99%   Weight:    85.2 kg    Intake/Output Summary (Last 24 hours) at 03/20/2018 1042 Last data filed at 03/20/2018 0600 Gross per 24 hour  Intake 1077.75 ml  Output 700 ml  Net 377.75 ml   Filed Weights   03/20/18 0500  Weight: 85.2 kg    Examination:  General exam: Elderly female lying in bed.  Pleasantly confused.  No distress Respiratory system: Bilateral decreased breath sounds at bases, no wheezing Cardiovascular system: S1 & S2 heard, Rate controlled Gastrointestinal system: Abdomen is nondistended, soft and nontender. Normal bowel sounds heard. Extremities: No cyanosis, clubbing; trace edema  Data Reviewed: I have personally reviewed following labs and imaging studies  CBC: Recent Labs  Lab 03/18/18 2320 03/19/18 0242 03/19/18 0632 03/19/18 1536 03/20/18 0621  WBC 9.6 10.1 8.6 7.9 5.5  NEUTROABS  --   --   --   --  3.3  HGB 12.5 12.0 13.0 12.0 11.5*  HCT 38.9 37.5 40.3 37.0 36.5  MCV 94.6 95.4 93.3 95.4 95.1  PLT 171 175 169 164 638   Basic Metabolic Panel: Recent Labs  Lab 03/18/18 2320 03/19/18 0632 03/20/18 0621  NA 141 142 145  K 4.3 4.3 3.9  CL 109 110 113*  CO2 21* 19* 21*  GLUCOSE 116* 105* 104*  BUN 55* 55* 43*  CREATININE 1.55* 1.53* 1.54*  CALCIUM 9.3 9.1 8.8*  MG  --   --  2.4   GFR: CrCl cannot be calculated (Unknown ideal weight.). Liver Function Tests: Recent Labs  Lab 03/18/18 2320  AST 30  ALT 11  ALKPHOS 55  BILITOT 0.7  PROT 7.2  ALBUMIN 3.8   No results for input(s): LIPASE, AMYLASE in the last 168 hours. No results for input(s): AMMONIA in the last 168 hours. Coagulation Profile: Recent Labs  Lab 03/18/18 2320  INR 1.02   Cardiac Enzymes: No results for input(s): CKTOTAL, CKMB, CKMBINDEX, TROPONINI in the last 168 hours. BNP (last 3 results) No results for input(s): PROBNP in the last 8760 hours. HbA1C: No results for input(s): HGBA1C in the last 72 hours. CBG: Recent Labs  Lab  03/19/18 1201 03/19/18 2245 03/20/18 0008 03/20/18 0418 03/20/18 0908  GLUCAP 81 71 69* 90 95   Lipid Profile: No results for input(s): CHOL, HDL, LDLCALC, TRIG, CHOLHDL, LDLDIRECT in the last 72 hours. Thyroid Function Tests: No results for input(s): TSH, T4TOTAL, FREET4, T3FREE, THYROIDAB in the last 72 hours. Anemia Panel: No results for input(s): VITAMINB12, FOLATE, FERRITIN, TIBC, IRON, RETICCTPCT in the last 72 hours. Sepsis Labs: No results for input(s): PROCALCITON, LATICACIDVEN in the last 168 hours.  No results found for this or any previous visit (from the past 240 hour(s)).       Radiology Studies: Ct Abdomen Pelvis Wo Contrast  Result Date: 03/19/2018 CLINICAL DATA:  One 84 year old female with hematemesis. Abdominal distension. EXAM: CT ABDOMEN AND PELVIS WITHOUT CONTRAST TECHNIQUE: Multidetector CT imaging of the abdomen and pelvis was performed following the standard protocol without IV contrast. COMPARISON:  Renal ultrasound dated 04/10/2016 FINDINGS: Evaluation of this exam is limited in the absence of intravenous contrast. Lower chest: Partially visualized moderate right pleural effusion with associated partial compressive atelectasis of the right lower lobe. Pneumonia is not excluded. Partially visualized area of consolidative change at the left lung base which may represent atelectasis although infiltrate is not excluded. Probable trace left pleural effusion. There is coronary vascular calcification. No intra-abdominal free air or free fluid. Hepatobiliary: There is irregular and nodular liver contour consistent with cirrhosis. No intrahepatic biliary ductal dilatation. The gallbladder is unremarkable. Pancreas: There is a 13 x 11 mm low attenuating lesion involving the tail of the pancreas which is not well characterized but may represent a side branch IPMN. This can be better characterized with MRI. No acute inflammatory changes. No dilatation of the main  pancreatic duct or gland atrophy. Spleen: Normal in size without focal abnormality. Adrenals/Urinary Tract: The adrenal glands are unremarkable. There is mild bilateral renal atrophy. There is no hydronephrosis or nephrolithiasis on either side. The visualized ureters and urinary bladder appear unremarkable. Stomach/Bowel: There is no bowel obstruction or active inflammation. There is extensive sigmoid diverticulosis without active inflammatory changes. The appendix is normal as visualized. Vascular/Lymphatic: Advanced aortoiliac atherosclerotic disease. There is a 2.5 cm infrarenal aortic ectasia. No portal venous gas. There is no adenopathy. Reproductive: Multiple calcified uterine fibroids with the largest measuring 3.7 cm in the right posterior body. Other: Midline vertical anterior pelvic wall incisional scar. Musculoskeletal: Osteopenia with degenerative changes of the spine and grade 1 L4-5 anterolisthesis. Degenerative changes of the hips. No acute osseous pathology. IMPRESSION: 1. No acute intra-abdominal or pelvic pathology. No bowel obstruction or active inflammation. Normal appendix. 2. Extensive sigmoid diverticulosis without active inflammatory changes. 3. Cirrhosis. 4. Partially visualized moderate right pleural effusion with associated partial compressive atelectasis of the right lower lobe. Partially visualized area of consolidative change at the left lung base may represent atelectasis although infiltrate is not excluded. 5. A 13 x 11 mm low attenuating lesion in  the tail of the pancreas. This can be better characterized with MRI. 6. Calcified uterine fibroids. Electronically Signed   By: Anner Crete M.D.   On: 03/19/2018 01:49        Scheduled Meds: . insulin aspart  0-9 Units Subcutaneous Q4H  . [START ON 03/22/2018] pantoprazole  40 mg Intravenous Q12H   Continuous Infusions: . sodium chloride 10 mL/hr at 03/19/18 0410  . dextrose 5 % and 0.9% NaCl 50 mL/hr at 03/20/18 0050  .  pantoprozole (PROTONIX) infusion 8 mg/hr (03/19/18 2333)     LOS: 0 days        Aline August, MD [facility] Pager [phone]  If 7PM-7AM, please contact night-coverage [email or web address] Password [facility] 03/20/2018, 10:42 AM

## 2018-03-21 DIAGNOSIS — E1122 Type 2 diabetes mellitus with diabetic chronic kidney disease: Secondary | ICD-10-CM | POA: Diagnosis not present

## 2018-03-21 DIAGNOSIS — I10 Essential (primary) hypertension: Secondary | ICD-10-CM | POA: Diagnosis not present

## 2018-03-21 DIAGNOSIS — K922 Gastrointestinal hemorrhage, unspecified: Secondary | ICD-10-CM | POA: Diagnosis not present

## 2018-03-21 DIAGNOSIS — M2550 Pain in unspecified joint: Secondary | ICD-10-CM | POA: Diagnosis not present

## 2018-03-21 DIAGNOSIS — N183 Chronic kidney disease, stage 3 (moderate): Secondary | ICD-10-CM | POA: Diagnosis not present

## 2018-03-21 DIAGNOSIS — Z7401 Bed confinement status: Secondary | ICD-10-CM | POA: Diagnosis not present

## 2018-03-21 LAB — BASIC METABOLIC PANEL
Anion gap: 7 (ref 5–15)
BUN: 33 mg/dL — ABNORMAL HIGH (ref 8–23)
CO2: 22 mmol/L (ref 22–32)
Calcium: 8.6 mg/dL — ABNORMAL LOW (ref 8.9–10.3)
Chloride: 112 mmol/L — ABNORMAL HIGH (ref 98–111)
Creatinine, Ser: 1.72 mg/dL — ABNORMAL HIGH (ref 0.44–1.00)
GFR calc Af Amer: 28 mL/min — ABNORMAL LOW (ref >60)
GFR calc non Af Amer: 24 mL/min — ABNORMAL LOW (ref >60)
Glucose, Bld: 187 mg/dL — ABNORMAL HIGH (ref 70–99)
Potassium: 3.9 mmol/L (ref 3.5–5.1)
Sodium: 141 mmol/L (ref 135–145)

## 2018-03-21 LAB — CBC WITH DIFFERENTIAL/PLATELET
ABS IMMATURE GRANULOCYTES: 0.01 10*3/uL (ref 0.00–0.07)
Basophils Absolute: 0.1 10*3/uL (ref 0.0–0.1)
Basophils Relative: 1 %
Eosinophils Absolute: 0.1 10*3/uL (ref 0.0–0.5)
Eosinophils Relative: 2 %
HCT: 36.3 % (ref 36.0–46.0)
Hemoglobin: 11.2 g/dL — ABNORMAL LOW (ref 12.0–15.0)
Immature Granulocytes: 0 %
LYMPHS ABS: 1.3 10*3/uL (ref 0.7–4.0)
Lymphocytes Relative: 25 %
MCH: 29.6 pg (ref 26.0–34.0)
MCHC: 30.9 g/dL (ref 30.0–36.0)
MCV: 96 fL (ref 80.0–100.0)
Monocytes Absolute: 0.8 10*3/uL (ref 0.1–1.0)
Monocytes Relative: 14 %
Neutro Abs: 3 10*3/uL (ref 1.7–7.7)
Neutrophils Relative %: 58 %
Platelets: 162 10*3/uL (ref 150–400)
RBC: 3.78 MIL/uL — ABNORMAL LOW (ref 3.87–5.11)
RDW: 13.7 % (ref 11.5–15.5)
WBC: 5.2 10*3/uL (ref 4.0–10.5)
nRBC: 0 % (ref 0.0–0.2)

## 2018-03-21 LAB — GLUCOSE, CAPILLARY
Glucose-Capillary: 104 mg/dL — ABNORMAL HIGH (ref 70–99)
Glucose-Capillary: 123 mg/dL — ABNORMAL HIGH (ref 70–99)
Glucose-Capillary: 89 mg/dL (ref 70–99)
Glucose-Capillary: 139 mg/dL — ABNORMAL HIGH (ref 70–99)
Glucose-Capillary: 149 mg/dL — ABNORMAL HIGH (ref 70–99)

## 2018-03-21 LAB — MAGNESIUM: Magnesium: 2.2 mg/dL (ref 1.7–2.4)

## 2018-03-21 MED ORDER — ONDANSETRON HCL 4 MG PO TABS: 4.0000 mg | ORAL_TABLET | Freq: Four times a day (QID) | ORAL | 0 refills | Status: AC | PRN

## 2018-03-21 MED ORDER — PANTOPRAZOLE SODIUM 40 MG PO TBEC: 40.0000 mg | DELAYED_RELEASE_TABLET | Freq: Two times a day (BID) | ORAL | 0 refills | Status: AC

## 2018-03-21 NOTE — NC FL2 (Signed)
[place] LEVEL OF CARE SCREENING TOOL     IDENTIFICATION  Patient Name: Jill Allen Birthdate: 1915/11/17 Sex: female Admission Date (Current Location): 03/18/2018  [facility] and [place] Number:  Herbalist and Address:  [facility],  [place] [address], [place]      Provider Number: [id number]  Attending Physician Name and Address:  Aline August, MD  Relative Name and Phone Number:  Alba Cory: [phone]    Current Level of Care: Hospital Recommended Level of Care: [place] Prior Approval Number:    Date Approved/Denied:   PASRR Number:    Discharge Plan: SNF    Current Diagnoses: Patient Active Problem List   Diagnosis Date Noted  . Acute GI bleeding 03/19/2018  . Acute lower UTI   . AKI (acute kidney injury) ([place]) 04/09/2016  . Hypercalcemia 04/09/2016  . Pressure injury of skin 02/22/2016  . Sepsis ([place]) 02/21/2016  . UTI (urinary tract infection) 02/06/2016  . Dehydration 02/06/2016  . Acute renal failure (ARF) ([place]) 02/06/2016  . CKD (chronic kidney disease), stage III ([place]) 02/06/2016  . DM (diabetes mellitus), type 2 with renal complications ([place]) 48/04/6551  . Venous stasis 02/06/2016  . PAOD (peripheral arterial occlusive disease) ([place]) 11/03/2014  . Chronic venous insufficiency 08/18/2014  . Venous stasis ulcer of right lower extremity ([place]) 08/18/2014  . Venous stasis ulcer of left lower extremity ([place]) 08/18/2014    Orientation RESPIRATION BLADDER Height & Weight     Self, Place, Situation  Normal Indwelling catheter Weight: 190 lb 4.1 oz (86.3 kg) Height:     BEHAVIORAL SYMPTOMS/MOOD NEUROLOGICAL BOWEL NUTRITION STATUS      Incontinent Diet  AMBULATORY STATUS COMMUNICATION OF NEEDS Skin   Extensive Assist Verbally Normal                       Personal Care Assistance Level of Assistance  Bathing, Feeding, Dressing Bathing Assistance: Maximum assistance Feeding  assistance: Limited assistance Dressing Assistance: Maximum assistance     Functional Limitations Info  Sight, Hearing, Speech Sight Info: Adequate Hearing Info: Adequate Speech Info: Adequate    SPECIAL CARE FACTORS FREQUENCY                       Contractures Contractures Info: Not present    Additional Factors Info  Code Status, Allergies Code Status Info: DNR Allergies Info: NKA           Current Medications (03/21/2018):  This is the current hospital active medication list Current Facility-Administered Medications  Medication Dose Route Frequency Provider Last Rate Last Dose  . 0.9 %  sodium chloride infusion   Intravenous Continuous Rise Patience, MD 10 mL/hr at 03/19/18 0410    . acetaminophen (TYLENOL) tablet 650 mg  650 mg Oral Q6H PRN Rise Patience, MD   650 mg at 03/20/18 1012   Or  . acetaminophen (TYLENOL) suppository 650 mg  650 mg Rectal Q6H PRN Rise Patience, MD      . amLODipine (NORVASC) tablet 10 mg  10 mg Oral Daily Aline August, MD   10 mg at 03/21/18 1058  . carvedilol (COREG) tablet 12.5 mg  12.5 mg Oral BID WC Alekh, Kshitiz, MD   12.5 mg at 03/21/18 0833  . hydrALAZINE (APRESOLINE) injection 5 mg  5 mg Intravenous Q4H PRN Rise Patience, MD      . insulin aspart (novoLOG) injection 0-9 Units  0-9 Units Subcutaneous Q4H Rise Patience, MD   1 Units at 03/21/18 [phone]  . ondansetron (ZOFRAN) tablet 4 mg  4 mg Oral Q6H PRN Rise Patience, MD       Or  . ondansetron [facility]) injection 4 mg  4 mg Intravenous Q6H PRN Rise Patience, MD      . pantoprazole (PROTONIX) 80 mg in sodium chloride 0.9 % 250 mL (0.32 mg/mL) infusion  8 mg/hr Intravenous Continuous Rise Patience, MD 25 mL/hr at 03/20/18 1248 8 mg/hr at 03/20/18 1248  . [START ON 03/22/2018] pantoprazole (PROTONIX) injection 40 mg  40 mg Intravenous Q12H Rise Patience, MD         Discharge Medications: Please see discharge summary  for a list of discharge medications.  Relevant Imaging Results:  Relevant Lab Results:   Additional Information SSN: [id number]  Pricilla Holm, [place]

## 2018-03-21 NOTE — Progress Notes (Signed)
Patient discharging back to [facility]. Faxed d/c summary and confirmed ability to return with facility. Patient's niece aware of d/c. [facility] has been called for transport.  RN call report: [phone].  Pricilla Holm, MSW, [place] Social Work [phone]

## 2018-03-21 NOTE — Progress Notes (Signed)
Patient discharging back to [facility]. CSW faxed appropriate documents and confirmed ability to return with facility. [facility] will be called for transport.  RN call report: [phone].  Pricilla Holm, MSW, [place] [facility] Work [phone]

## 2018-03-21 NOTE — Progress Notes (Signed)
Pt leaving at this time with [facility], headed back to [place]. Alert, oriented, and without c/o.

## 2018-03-21 NOTE — Discharge Summary (Signed)
Physician Discharge Summary  Jill Allen SEG:[id number] DOB: 1915-10-23 DOA: 03/18/2018  PCP: Earlyne Iba, MD  Admit date: 03/18/2018 Discharge date: 03/21/2018  Admitted From: SNF Disposition:  SNF  Recommendations for Outpatient Follow-up:  1. Follow up with SNF provider at earliest convenience with repeat CBC/BMP 2. Follow-up with gastroenterology as an outpatient 3. follow-up in the ED if symptoms worsen or new appear   Home Health: No Equipment/Devices: None Discharge Condition: Guarded to poor CODE STATUS: DNR Diet recommendation: Heart Healthy / Carb Modified  Brief/Interim Summary: 84 year old female with history of diabetes mellitus type II, gout, hypertension, chronic kidney disease stage III, dementia presented with concern for hematemesis versus coffee-ground emesis.  She was started on intravenous Protonix.  GI was consulted.  Patient was managed conservatively.  Hemoglobin remained stable.  No more vomiting since admission.  Patient is tolerating diet.  GI has cleared the patient for discharge.  She will be discharged to nursing home on Protonix twice a day.   Discharge Diagnoses:  Principal Problem:   Acute GI bleeding Active Problems:   CKD (chronic kidney disease), stage III (HCC)   DM (diabetes mellitus), type 2 with renal complications (HCC)  Probable upper GI bleeding presenting with hematemesis/coffee-ground emesis -Currently on Protonix.  GI following and no plans for EGD unless patient is actively bleeding.  No overnight vomiting episodes reported by the nursing staff since admission.    Started on diet which has been advanced.  She is tolerating diet. -Hemoglobin stable -GI is okay for the patient to be discharged on Protonix 40 mg twice a day.  Outpatient follow-up  Diabetes mellitus type 2 -Continue outpatient regimen on discharge  Hypertension -Continue outpatient regimen  Chronic kidney disease stage III -Creatinine at baseline.   Outpatient follow-up  Dementia -Monitor mental status.  Fall precautions.  Outpatient follow-up   Discharge Instructions  Discharge Instructions    Call MD for:  difficulty breathing, headache or visual disturbances   Complete by:  As directed    Call MD for:  extreme fatigue   Complete by:  As directed    Call MD for:  hives   Complete by:  As directed    Call MD for:  persistant dizziness or light-headedness   Complete by:  As directed    Call MD for:  persistant nausea and vomiting   Complete by:  As directed    Call MD for:  severe uncontrolled pain   Complete by:  As directed    Call MD for:  temperature >100.4   Complete by:  As directed    Diet - low sodium heart healthy   Complete by:  As directed    Diet Carb Modified   Complete by:  As directed    Increase activity slowly   Complete by:  As directed      Allergies as of 03/21/2018   No Known Allergies     Medication List    STOP taking these medications   aspirin EC 81 MG tablet   magnesium oxide 400 (241.3 Mg) MG tablet Commonly known as:  MAG-OX   omeprazole 20 MG capsule Commonly known as:  PRILOSEC     TAKE these medications   acetaminophen 325 MG tablet Commonly known as:  TYLENOL Take 650 mg by mouth 3 (three) times daily.   allopurinol 100 MG tablet Commonly known as:  ZYLOPRIM Take 50 mg by mouth daily.   amLODipine 10 MG tablet Commonly known as:  NORVASC Take  10 mg by mouth daily.   carvedilol 12.5 MG tablet Commonly known as:  COREG Take 12.5 mg by mouth 2 (two) times daily with a meal.   DAILY VITE PO Take 1 tablet by mouth daily.   guaiFENesin-dextromethorphan 100-10 MG/5ML syrup Commonly known as:  ROBITUSSIN DM Take 5 mLs by mouth every 4 (four) hours as needed for cough.   insulin detemir 100 UNIT/ML injection Commonly known as:  LEVEMIR Inject 10 Units into the skin at bedtime.   NUTRITIONAL DRINK PO Take 120 mLs by mouth 3 (three) times daily.   OCUVITE EXTRA  Tabs Take 1 tablet by mouth daily.   ondansetron 4 MG tablet Commonly known as:  ZOFRAN Take 1 tablet (4 mg total) by mouth every 6 (six) hours as needed for nausea.   pantoprazole 40 MG tablet Commonly known as:  PROTONIX Take 1 tablet (40 mg total) by mouth 2 (two) times daily.   vitamin C 500 MG tablet Commonly known as:  ASCORBIC ACID Take 500 mg by mouth 2 (two) times daily.      Follow-up Information    Duffy Bruce, Manya Silvas, MD. Schedule an appointment as soon as possible for a visit in 1 week(s).   Specialty:  Internal Medicine Why:  With repeat CBC/BMP Contact information: [address] [place] [address] [phone]        Laurence Spates, MD. Schedule an appointment as soon as possible for a visit in 2 week(s).   Specialty:  Gastroenterology Contact information: [address] [place]  [address] [phone]          No Known Allergies  Consultations:  Gastroenterology   Procedures/Studies: Ct Abdomen Pelvis Wo Contrast  Result Date: 03/19/2018 CLINICAL DATA:  One 84 year old female with hematemesis. Abdominal distension. EXAM: CT ABDOMEN AND PELVIS WITHOUT CONTRAST TECHNIQUE: Multidetector CT imaging of the abdomen and pelvis was performed following the standard protocol without IV contrast. COMPARISON:  Renal ultrasound dated 04/10/2016 FINDINGS: Evaluation of this exam is limited in the absence of intravenous contrast. Lower chest: Partially visualized moderate right pleural effusion with associated partial compressive atelectasis of the right lower lobe. Pneumonia is not excluded. Partially visualized area of consolidative change at the left lung base which may represent atelectasis although infiltrate is not excluded. Probable trace left pleural effusion. There is coronary vascular calcification. No intra-abdominal free air or free fluid. Hepatobiliary: There is irregular and nodular liver contour consistent with  cirrhosis. No intrahepatic biliary ductal dilatation. The gallbladder is unremarkable. Pancreas: There is a 13 x 11 mm low attenuating lesion involving the tail of the pancreas which is not well characterized but may represent a side branch IPMN. This can be better characterized with MRI. No acute inflammatory changes. No dilatation of the main pancreatic duct or gland atrophy. Spleen: Normal in size without focal abnormality. Adrenals/Urinary Tract: The adrenal glands are unremarkable. There is mild bilateral renal atrophy. There is no hydronephrosis or nephrolithiasis on either side. The visualized ureters and urinary bladder appear unremarkable. Stomach/Bowel: There is no bowel obstruction or active inflammation. There is extensive sigmoid diverticulosis without active inflammatory changes. The appendix is normal as visualized. Vascular/Lymphatic: Advanced aortoiliac atherosclerotic disease. There is a 2.5 cm infrarenal aortic ectasia. No portal venous gas. There is no adenopathy. Reproductive: Multiple calcified uterine fibroids with the largest measuring 3.7 cm in the right posterior body. Other: Midline vertical anterior pelvic wall incisional scar. Musculoskeletal: Osteopenia with degenerative changes of the spine and grade  1 L4-5 anterolisthesis. Degenerative changes of the hips. No acute osseous pathology. IMPRESSION: 1. No acute intra-abdominal or pelvic pathology. No bowel obstruction or active inflammation. Normal appendix. 2. Extensive sigmoid diverticulosis without active inflammatory changes. 3. Cirrhosis. 4. Partially visualized moderate right pleural effusion with associated partial compressive atelectasis of the right lower lobe. Partially visualized area of consolidative change at the left lung base may represent atelectasis although infiltrate is not excluded. 5. A 13 x 11 mm low attenuating lesion in the tail of the pancreas. This can be better characterized with MRI. 6. Calcified uterine  fibroids. Electronically Signed   By: Anner Crete M.D.   On: 03/19/2018 01:49      Subjective: Patient seen and examined at bedside.  She is pleasantly confused.  No overnight vomiting reported by nursing staff.  Tolerating diet.  Overnight fevers  Discharge Exam: Vitals:   03/20/18 2024 03/21/18 0458  BP: 118/66 (!) 148/68  Pulse: 67 61  Resp: 18 18  Temp: 98.1 F (36.7 C) 98.3 F (36.8 C)  SpO2: 98% 99%   Vitals:   03/20/18 0500 03/20/18 1526 03/20/18 2024 03/21/18 0458  BP:  127/69 118/66 (!) 148/68  Pulse:  (!) 54 67 61  Resp:  16 18 18   Temp:  98.5 F (36.9 C) 98.1 F (36.7 C) 98.3 F (36.8 C)  TempSrc:  Oral Oral Oral  SpO2:  97% 98% 99%  Weight: 85.2 kg   86.3 kg    General: Pt is awake, pleasantly confused.  No distress Cardiovascular: rate controlled, S1/S2 + Respiratory: bilateral decreased breath sounds at bases, scattered crackles Abdominal: Soft, NT, ND, bowel sounds + Extremities: Trace edema, no cyanosis    The results of significant diagnostics from this hospitalization (including imaging, microbiology, ancillary and laboratory) are listed below for reference.     Microbiology: Recent Results (from the past 240 hour(s))  Culture, Urine     Status: Abnormal (Preliminary result)   Collection Time: 03/19/18  6:34 PM  Result Value Ref Range Status   Specimen Description   Final    URINE, CLEAN CATCH Performed at [facility], [place] [address] [place], [place] [address]    Special Requests   Final    NONE Performed at [facility], [place] [address], [place], [place] [address]    Culture (A)  Final    >=100,000 COLONIES/mL UNIDENTIFIED ORGANISM Performed at [facility], [place] [address], [place], [place] [address]    Report Status PENDING  Incomplete     Labs: BNP (last 3 results) No results for input(s): BNP in the last 8760 hours. Basic Metabolic Panel: Recent Labs  Lab 03/18/18 2320  03/19/18 0632 03/20/18 0621 03/21/18 0633  NA 141 142 145 141  K 4.3 4.3 3.9 3.9  CL 109 110 113* 112*  CO2 21* 19* 21* 22  GLUCOSE 116* 105* 104* 187*  BUN 55* 55* 43* 33*  CREATININE 1.55* 1.53* 1.54* 1.72*  CALCIUM 9.3 9.1 8.8* 8.6*  MG  --   --  2.4 2.2   Liver Function Tests: Recent Labs  Lab 03/18/18 2320  AST 30  ALT 11  ALKPHOS 55  BILITOT 0.7  PROT 7.2  ALBUMIN 3.8   No results for input(s): LIPASE, AMYLASE in the last 168 hours. No results for input(s): AMMONIA in the last 168 hours. CBC: Recent Labs  Lab 03/19/18 0242 03/19/18 0632 03/19/18 1536 03/20/18 0621 03/21/18 0633  WBC 10.1 8.6 7.9 5.5  5.2  NEUTROABS  --   --   --  3.3 3.0  HGB 12.0 13.0 12.0 11.5* 11.2*  HCT 37.5 40.3 37.0 36.5 36.3  MCV 95.4 93.3 95.4 95.1 96.0  PLT 175 169 164 160 162   Cardiac Enzymes: No results for input(s): CKTOTAL, CKMB, CKMBINDEX, TROPONINI in the last 168 hours. BNP: Invalid input(s): POCBNP CBG: Recent Labs  Lab 03/20/18 1642 03/20/18 2014 03/20/18 2345 03/21/18 0456 03/21/18 0757  GLUCAP 146* 104* 123* 89 139*   D-Dimer No results for input(s): DDIMER in the last 72 hours. Hgb A1c No results for input(s): HGBA1C in the last 72 hours. Lipid Profile No results for input(s): CHOL, HDL, LDLCALC, TRIG, CHOLHDL, LDLDIRECT in the last 72 hours. Thyroid function studies No results for input(s): TSH, T4TOTAL, T3FREE, THYROIDAB in the last 72 hours.  Invalid input(s): FREET3 Anemia work up No results for input(s): VITAMINB12, FOLATE, FERRITIN, TIBC, IRON, RETICCTPCT in the last 72 hours. Urinalysis    Component Value Date/Time   COLORURINE YELLOW 03/19/2018 1834   APPEARANCEUR CLOUDY (A) 03/19/2018 1834   LABSPEC 1.016 03/19/2018 1834   PHURINE 5.0 03/19/2018 1834   GLUCOSEU NEGATIVE 03/19/2018 1834   HGBUR SMALL (A) 03/19/2018 1834   BILIRUBINUR NEGATIVE 03/19/2018 1834   KETONESUR 5 (A) 03/19/2018 1834   PROTEINUR NEGATIVE 03/19/2018 1834    NITRITE NEGATIVE 03/19/2018 1834   LEUKOCYTESUR LARGE (A) 03/19/2018 1834   Sepsis Labs Invalid input(s): PROCALCITONIN,  WBC,  LACTICIDVEN Microbiology Recent Results (from the past 240 hour(s))  Culture, Urine     Status: Abnormal (Preliminary result)   Collection Time: 03/19/18  6:34 PM  Result Value Ref Range Status   Specimen Description   Final    URINE, CLEAN CATCH Performed at [facility], [place] [address], [place], [place] [address]    Special Requests   Final    NONE Performed at [facility], [place] [address], [place], [place] [address]    Culture (A)  Final    >=100,000 COLONIES/mL UNIDENTIFIED ORGANISM Performed at [facility], [place] [address] [place], [place] [address]    Report Status PENDING  Incomplete     Time coordinating discharge: 35 minutes  SIGNED:   Aline August, MD  [facility] 03/21/2018, 10:20 AM Pager: [phone]  If 7PM-7AM, please contact night-coverage [email or web address] Password [email or web address]

## 2018-03-21 NOTE — Progress Notes (Signed)
Writer has given report to [place], nurse at [facility], at this time. [facility] will be called for transport.

## 2018-03-21 NOTE — Progress Notes (Signed)
Writer has phoned pt's niece, Corrie Dandy, and alerted her of pt's discharge back to [place].

## 2018-03-22 DIAGNOSIS — R8290 Unspecified abnormal findings in urine: Secondary | ICD-10-CM | POA: Diagnosis not present

## 2018-03-22 DIAGNOSIS — K9289 Other specified diseases of the digestive system: Secondary | ICD-10-CM | POA: Diagnosis not present

## 2018-03-22 DIAGNOSIS — I7389 Other specified peripheral vascular diseases: Secondary | ICD-10-CM | POA: Diagnosis not present

## 2018-03-22 DIAGNOSIS — E1122 Type 2 diabetes mellitus with diabetic chronic kidney disease: Secondary | ICD-10-CM | POA: Diagnosis not present

## 2018-03-22 LAB — GLUCOSE, CAPILLARY: Glucose-Capillary: 85 mg/dL (ref 70–99)

## 2018-03-22 LAB — URINE CULTURE: Culture: >=100000 — AB

## 2018-03-25 DIAGNOSIS — K920 Hematemesis: Secondary | ICD-10-CM | POA: Diagnosis not present

## 2018-04-02 DIAGNOSIS — K9289 Other specified diseases of the digestive system: Secondary | ICD-10-CM | POA: Diagnosis not present

## 2018-04-02 DIAGNOSIS — E1122 Type 2 diabetes mellitus with diabetic chronic kidney disease: Secondary | ICD-10-CM | POA: Diagnosis not present

## 2018-04-09 DIAGNOSIS — R4189 Other symptoms and signs involving cognitive functions and awareness: Secondary | ICD-10-CM | POA: Diagnosis not present

## 2018-04-09 DIAGNOSIS — R05 Cough: Secondary | ICD-10-CM | POA: Diagnosis not present

## 2018-04-09 DIAGNOSIS — E119 Type 2 diabetes mellitus without complications: Secondary | ICD-10-CM | POA: Diagnosis not present

## 2018-04-09 DIAGNOSIS — R1312 Dysphagia, oropharyngeal phase: Secondary | ICD-10-CM | POA: Diagnosis not present

## 2018-04-13 DIAGNOSIS — R1312 Dysphagia, oropharyngeal phase: Secondary | ICD-10-CM | POA: Diagnosis not present

## 2018-07-02 IMAGING — CR DG SHOULDER 2+V*L*
2 series · 2 of 2 positions shown · non-contrast
Comparison: None.

CLINICAL DATA: Left shoulder pain

EXAM:
LEFT SHOULDER - 2+ VIEW

[x shoulder axillary left]
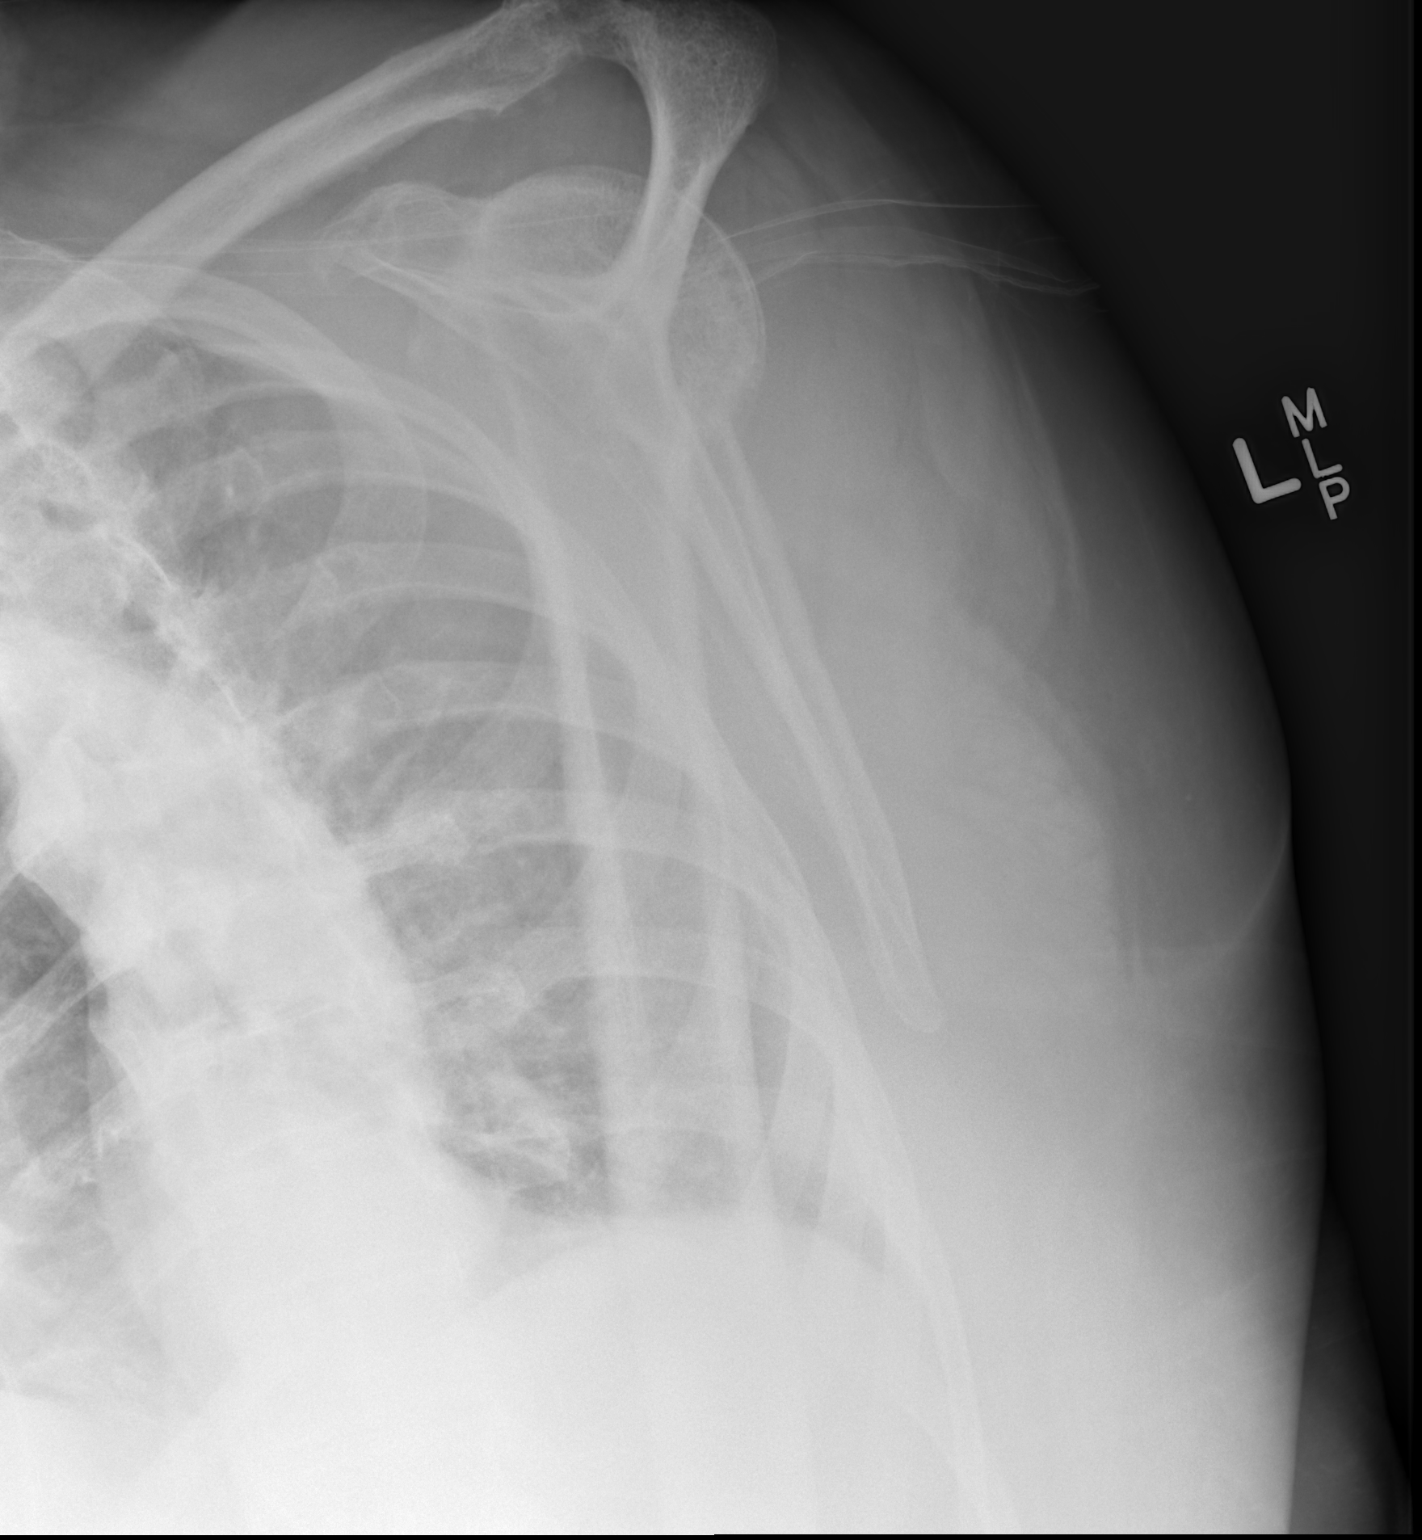

[x shoulder ap left]
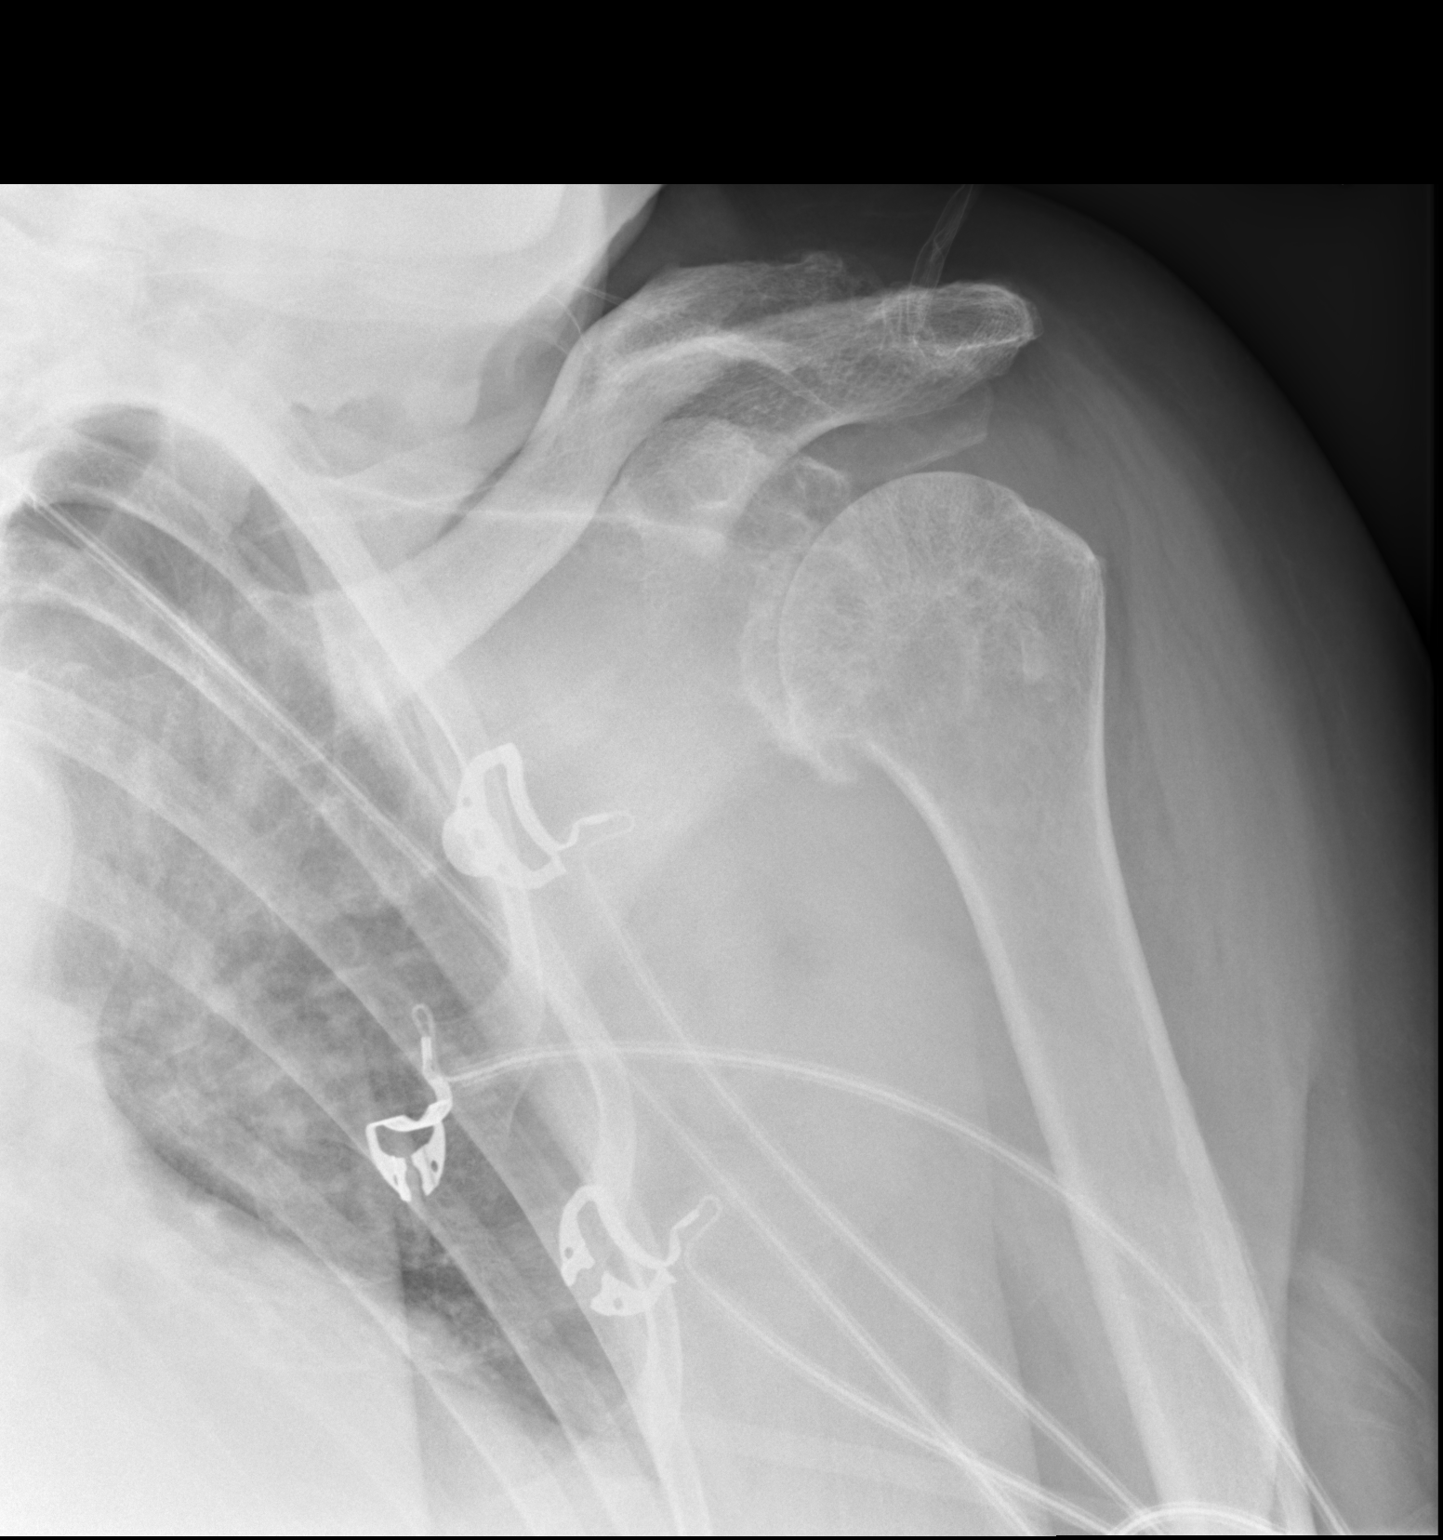

[2 of 2 positions shown; findings below may reference images not displayed]

FINDINGS: Two views of the left shoulder submitted. Significant degenerative
changes with narrowing of joint space and mild gurney sclerosis
noted glenohumeral joint. There is spurring of humeral head.
Moderate degenerative changes AC joint. No acute fracture or
subluxation
IMPRESSION: No acute fracture or subluxation. Osteoarthritic changes as
described above.

## 2018-08-19 IMAGING — DX DG CHEST 1V PORT
1 series · 1 of 1 positions shown · non-contrast
Comparison: February 21, 2016

CLINICAL DATA: Generalized weakness.

EXAM:
PORTABLE CHEST 1 VIEW

[chest ap]
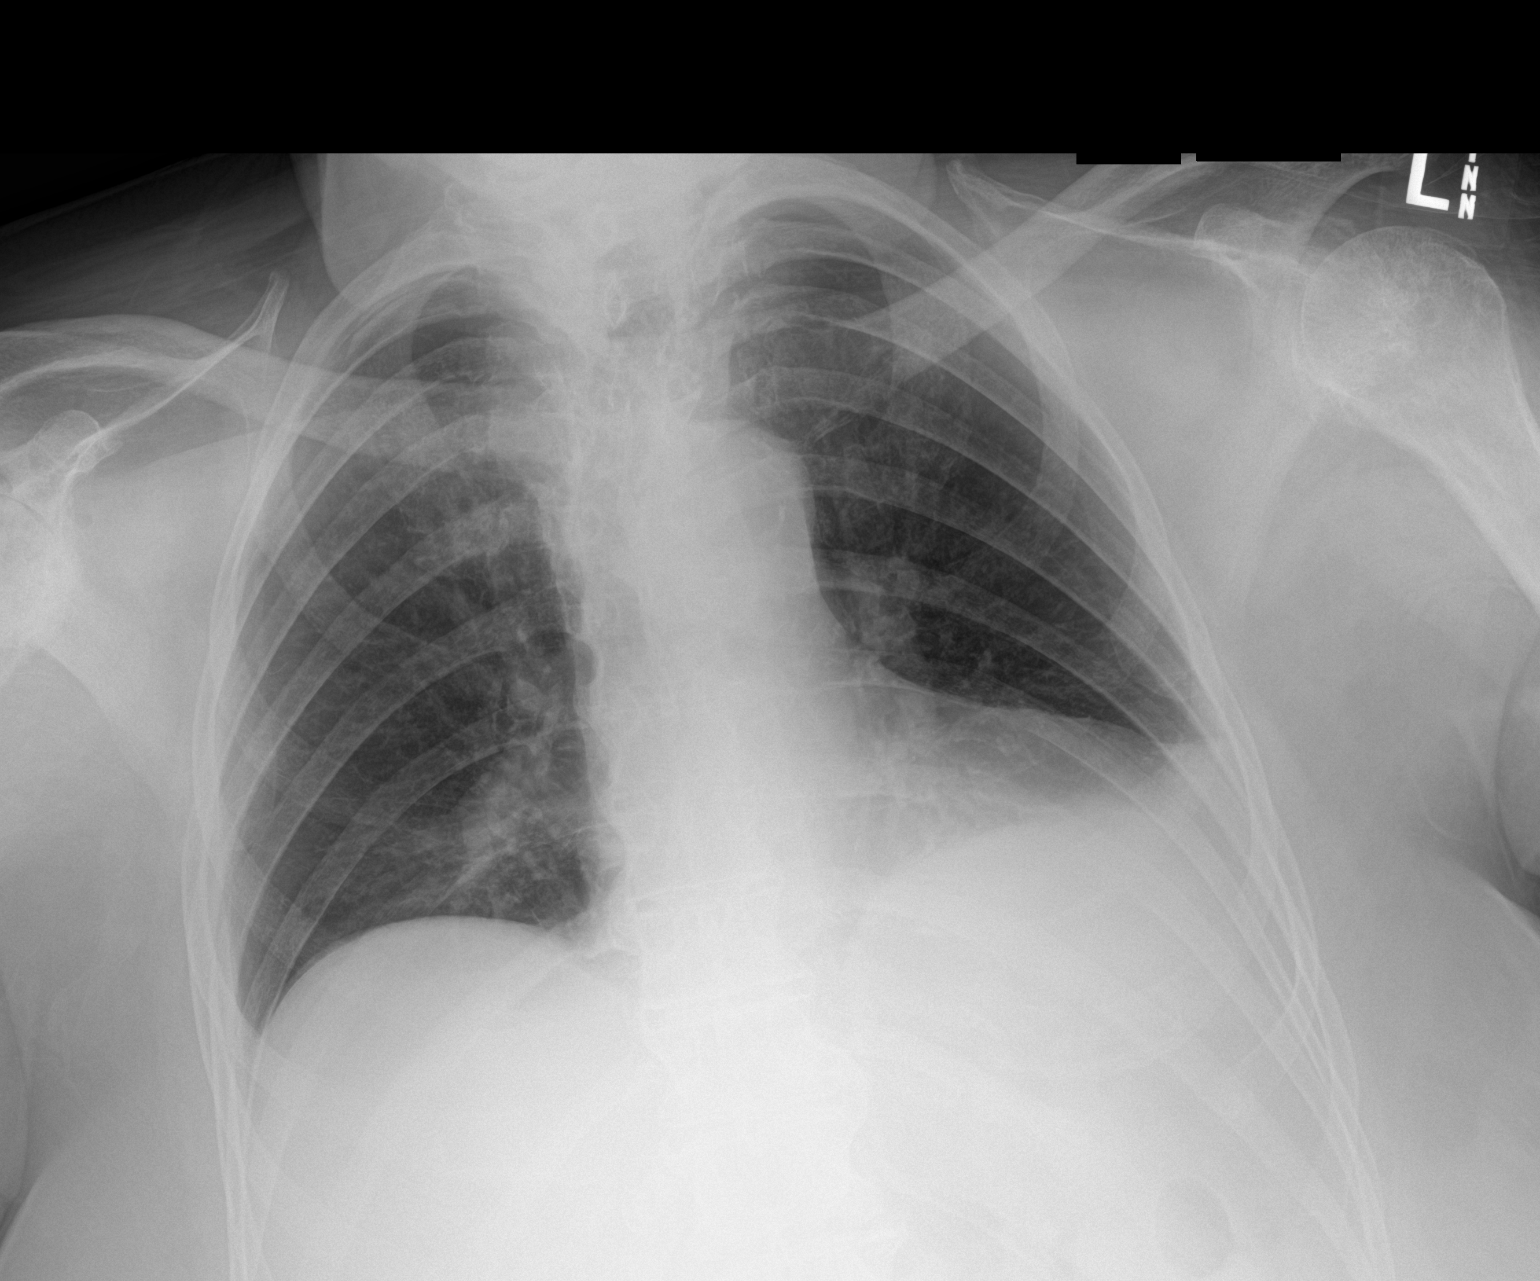

[1 of 1 positions shown; findings below may reference images not displayed]

FINDINGS: Mild opacity in the lateral left lung base is favored to represent
atelectasis. No other interval changes or acute abnormalities.
IMPRESSION: Mild opacity in the lateral left lung base may represent
atelectasis. A PA and lateral chest x-ray could better evaluate.
